# Patient Record
Sex: Male | Born: 1965
Health system: Southern US, Community
[De-identification: ages and names within clinical notes are randomized; demographics above are authoritative.]

## PROBLEM LIST (undated history)

## (undated) DIAGNOSIS — K219 Gastro-esophageal reflux disease without esophagitis: Secondary | ICD-10-CM

## (undated) DIAGNOSIS — R7881 Bacteremia: Secondary | ICD-10-CM

## (undated) DIAGNOSIS — M48062 Spinal stenosis, lumbar region with neurogenic claudication: Secondary | ICD-10-CM

## (undated) DIAGNOSIS — R079 Chest pain, unspecified: Secondary | ICD-10-CM

## (undated) DIAGNOSIS — M5442 Lumbago with sciatica, left side: Secondary | ICD-10-CM

## (undated) DIAGNOSIS — M5410 Radiculopathy, site unspecified: Secondary | ICD-10-CM

## (undated) DIAGNOSIS — K859 Acute pancreatitis without necrosis or infection, unspecified: Secondary | ICD-10-CM

## (undated) DIAGNOSIS — G894 Chronic pain syndrome: Secondary | ICD-10-CM

## (undated) DIAGNOSIS — N529 Male erectile dysfunction, unspecified: Secondary | ICD-10-CM

## (undated) DIAGNOSIS — G8929 Other chronic pain: Secondary | ICD-10-CM

## (undated) DIAGNOSIS — G4700 Insomnia, unspecified: Secondary | ICD-10-CM

## (undated) DIAGNOSIS — E785 Hyperlipidemia, unspecified: Secondary | ICD-10-CM

## (undated) DIAGNOSIS — Z9289 Personal history of other medical treatment: Secondary | ICD-10-CM

## (undated) DIAGNOSIS — I10 Essential (primary) hypertension: Secondary | ICD-10-CM

## (undated) DIAGNOSIS — K810 Acute cholecystitis: Secondary | ICD-10-CM

## (undated) DIAGNOSIS — K760 Fatty (change of) liver, not elsewhere classified: Secondary | ICD-10-CM

## (undated) DIAGNOSIS — M21372 Foot drop, left foot: Secondary | ICD-10-CM

## (undated) DIAGNOSIS — U071 COVID-19: Secondary | ICD-10-CM

## (undated) DIAGNOSIS — R011 Cardiac murmur, unspecified: Secondary | ICD-10-CM

## (undated) DIAGNOSIS — F419 Anxiety disorder, unspecified: Secondary | ICD-10-CM

## (undated) DIAGNOSIS — B9620 Unspecified Escherichia coli [E. coli] as the cause of diseases classified elsewhere: Secondary | ICD-10-CM

## (undated) DIAGNOSIS — N400 Enlarged prostate without lower urinary tract symptoms: Secondary | ICD-10-CM

## (undated) HISTORY — DX: Hyperlipidemia, unspecified: E78.5

## (undated) HISTORY — DX: Chest pain, unspecified: R07.9

## (undated) HISTORY — DX: Gastro-esophageal reflux disease without esophagitis: K21.9

## (undated) HISTORY — PX: TESTICLE TORSION REDUCTION: SHX795

## (undated) HISTORY — DX: Personal history of other medical treatment: Z92.89

## (undated) HISTORY — PX: LACERATION REPAIR: SHX5168

## (undated) HISTORY — DX: Anxiety disorder, unspecified: F41.9

---

## 2000-10-29 ENCOUNTER — Emergency Department (HOSPITAL_COMMUNITY): Admission: EM | Admit: 2000-10-29 | Discharge: 2000-10-29 | Payer: Self-pay | Admitting: Emergency Medicine

## 2000-10-29 ENCOUNTER — Encounter: Payer: Self-pay | Admitting: Emergency Medicine

## 2002-08-04 ENCOUNTER — Emergency Department (HOSPITAL_COMMUNITY): Admission: EM | Admit: 2002-08-04 | Discharge: 2002-08-05 | Payer: Self-pay | Admitting: *Deleted

## 2002-08-04 ENCOUNTER — Encounter: Payer: Self-pay | Admitting: *Deleted

## 2002-08-05 ENCOUNTER — Encounter: Payer: Self-pay | Admitting: *Deleted

## 2004-04-10 DIAGNOSIS — R079 Chest pain, unspecified: Secondary | ICD-10-CM

## 2004-04-10 DIAGNOSIS — Z9289 Personal history of other medical treatment: Secondary | ICD-10-CM

## 2004-04-10 HISTORY — DX: Chest pain, unspecified: R07.9

## 2004-04-10 HISTORY — DX: Personal history of other medical treatment: Z92.89

## 2005-03-04 ENCOUNTER — Emergency Department: Payer: Self-pay | Admitting: Emergency Medicine

## 2005-03-09 ENCOUNTER — Emergency Department (HOSPITAL_COMMUNITY): Admission: EM | Admit: 2005-03-09 | Discharge: 2005-03-10 | Payer: Self-pay | Admitting: Emergency Medicine

## 2005-03-11 ENCOUNTER — Emergency Department (HOSPITAL_COMMUNITY): Admission: EM | Admit: 2005-03-11 | Discharge: 2005-03-12 | Payer: Self-pay | Admitting: Emergency Medicine

## 2005-04-05 ENCOUNTER — Inpatient Hospital Stay (HOSPITAL_COMMUNITY): Admission: EM | Admit: 2005-04-05 | Discharge: 2005-04-07 | Payer: Self-pay | Admitting: Emergency Medicine

## 2005-04-07 ENCOUNTER — Ambulatory Visit: Payer: Self-pay | Admitting: Cardiology

## 2005-04-07 ENCOUNTER — Encounter: Payer: Self-pay | Admitting: Cardiology

## 2005-04-11 ENCOUNTER — Ambulatory Visit: Payer: Self-pay | Admitting: Internal Medicine

## 2005-04-15 ENCOUNTER — Ambulatory Visit: Payer: Self-pay | Admitting: Family Medicine

## 2005-04-17 ENCOUNTER — Ambulatory Visit: Payer: Self-pay | Admitting: Internal Medicine

## 2005-04-25 ENCOUNTER — Ambulatory Visit: Payer: Self-pay | Admitting: Internal Medicine

## 2005-10-19 ENCOUNTER — Ambulatory Visit: Payer: Self-pay | Admitting: Family Medicine

## 2006-04-27 ENCOUNTER — Ambulatory Visit: Payer: Self-pay | Admitting: Family Medicine

## 2006-07-04 ENCOUNTER — Ambulatory Visit: Payer: Self-pay | Admitting: Family Medicine

## 2006-11-28 ENCOUNTER — Ambulatory Visit: Payer: Self-pay | Admitting: Family Medicine

## 2006-12-07 ENCOUNTER — Ambulatory Visit: Payer: Self-pay | Admitting: Family Medicine

## 2006-12-07 DIAGNOSIS — F411 Generalized anxiety disorder: Secondary | ICD-10-CM

## 2006-12-07 DIAGNOSIS — F419 Anxiety disorder, unspecified: Secondary | ICD-10-CM | POA: Insufficient documentation

## 2006-12-11 ENCOUNTER — Telehealth (INDEPENDENT_AMBULATORY_CARE_PROVIDER_SITE_OTHER): Payer: Self-pay | Admitting: *Deleted

## 2006-12-11 ENCOUNTER — Encounter (INDEPENDENT_AMBULATORY_CARE_PROVIDER_SITE_OTHER): Payer: Self-pay | Admitting: *Deleted

## 2006-12-11 LAB — CONVERTED CEMR LAB
Cholesterol: 250 mg/dL (ref 0–200)
Direct LDL: 178.2 mg/dL
Glucose, Bld: 81 mg/dL (ref 70–99)
HDL: 39.8 mg/dL (ref >39.0)
Total CHOL/HDL Ratio: 6.3
Triglycerides: 137 mg/dL (ref 0–149)
VLDL: 27 mg/dL (ref 0–40)

## 2007-03-22 ENCOUNTER — Ambulatory Visit: Payer: Self-pay | Admitting: Family Medicine

## 2007-03-22 DIAGNOSIS — M549 Dorsalgia, unspecified: Secondary | ICD-10-CM | POA: Insufficient documentation

## 2007-04-05 ENCOUNTER — Ambulatory Visit: Payer: Self-pay | Admitting: Family Medicine

## 2007-04-05 ENCOUNTER — Telehealth (INDEPENDENT_AMBULATORY_CARE_PROVIDER_SITE_OTHER): Payer: Self-pay | Admitting: *Deleted

## 2007-04-05 DIAGNOSIS — M5430 Sciatica, unspecified side: Secondary | ICD-10-CM | POA: Insufficient documentation

## 2007-04-12 ENCOUNTER — Encounter: Admission: RE | Admit: 2007-04-12 | Discharge: 2007-04-12 | Payer: Self-pay | Admitting: Family Medicine

## 2007-04-16 ENCOUNTER — Telehealth (INDEPENDENT_AMBULATORY_CARE_PROVIDER_SITE_OTHER): Payer: Self-pay | Admitting: Family Medicine

## 2007-04-17 ENCOUNTER — Encounter (INDEPENDENT_AMBULATORY_CARE_PROVIDER_SITE_OTHER): Payer: Self-pay | Admitting: Family Medicine

## 2007-04-17 ENCOUNTER — Telehealth (INDEPENDENT_AMBULATORY_CARE_PROVIDER_SITE_OTHER): Payer: Self-pay | Admitting: *Deleted

## 2007-04-22 ENCOUNTER — Telehealth (INDEPENDENT_AMBULATORY_CARE_PROVIDER_SITE_OTHER): Payer: Self-pay | Admitting: Family Medicine

## 2007-04-24 ENCOUNTER — Encounter (INDEPENDENT_AMBULATORY_CARE_PROVIDER_SITE_OTHER): Payer: Self-pay | Admitting: Family Medicine

## 2007-05-08 ENCOUNTER — Telehealth (INDEPENDENT_AMBULATORY_CARE_PROVIDER_SITE_OTHER): Payer: Self-pay | Admitting: *Deleted

## 2007-06-03 ENCOUNTER — Telehealth (INDEPENDENT_AMBULATORY_CARE_PROVIDER_SITE_OTHER): Payer: Self-pay | Admitting: *Deleted

## 2007-12-02 ENCOUNTER — Emergency Department (HOSPITAL_COMMUNITY): Admission: EM | Admit: 2007-12-02 | Discharge: 2007-12-02 | Payer: Self-pay | Admitting: Emergency Medicine

## 2009-04-07 ENCOUNTER — Encounter
Admission: RE | Admit: 2009-04-07 | Discharge: 2009-04-07 | Payer: Self-pay | Admitting: Physical Medicine and Rehabilitation

## 2009-10-23 ENCOUNTER — Emergency Department (HOSPITAL_COMMUNITY): Admission: EM | Admit: 2009-10-23 | Discharge: 2009-10-23 | Payer: Self-pay | Admitting: Emergency Medicine

## 2010-05-12 NOTE — Progress Notes (Signed)
Summary: PHYSICAL THERPY REFERRAL?  Phone Note From Other Clinic   Caller: Referral Coordinator Summary of Call: [facility] [place]- [phone]--THEY RECD A CALL FROM PT STATING DR Blossom Hoops RECOMMENDED P/T--PER NOTES IN CHART--RECOMMEND NEURO-SURGICAL--PLS REVIEW CALL [place] AT [facility] P/T...................................................................Marland KitchenDaine Gip  June 03, 2007 3:08 PM Initial call taken by: Daine Gip,  June 03, 2007 3:08 PM  Follow-up for Phone Call        Dr. Blossom Hoops,  I see in your notes that (append on xray/mri) that Dr. Lovell Sheehan is seeing patient and I think he is the one that is sending for PT or is it you and there is no documentation.  Thanks,  Marcelino Duster Follow-up by: Ardyth Man,  June 03, 2007 3:18 PM  Additional Follow-up for Phone Call Additional follow up Details #1::        Correct. It is not me. Please talk to [place]. Additional Follow-up by: Leanne Chang MD,  June 03, 2007 3:38 PM    Additional Follow-up for Phone Call Additional follow up Details #2::    sp[facility] with Kathryne Hitch at [facility] and she is aware to call dr Lovell Sheehan at [facility] neurosurgery if she has any questions or needs information for insurance authorization.  pt was also aware that this should be going through dr Lovell Sheehan office.  he was not satisfied with his care at the physical therapy office that he was sent to initially. Follow-up by: Gwen Pounds,  June 04, 2007 2:31 PM

## 2010-05-12 NOTE — Progress Notes (Signed)
Summary: [place] Pert 500MG /FLEXERIL 10MG   Phone Note Refill Request Call back at Home Phone [phone] Message from:  Patient  Refills Requested: Medication #1:  NAPROSYN 500 MG TABS 1 two times a day pc prn  Medication #2:  FLEXERIL 10 MG TABS 1 by mouth tid. [facility] [address] -- CALL FROM PT -- STILL WAITING ON REFERRAL TO NEUROSURGEON AND STILL IN PAIN.  LET HIM KNOW THAT I WOULD LET HIM KNOW SOMETHING ABOUT HIS REFERRAL BY THE END OF THE DAY  Initial call taken by: Gwen Pounds,  April 22, 2007 9:45 AM  Follow-up for Phone Call        okay for 1 refill each. Recommend take OTC Prilosec while taking Naprosyn. Follow instruction on bottle of Prilosec Follow-up by: Leanne Chang MD,  April 22, 2007 10:12 AM  Additional Follow-up for Phone Call Additional follow up Details #1::        Spoke with patient and is aware. ...................................................................Ardyth Man  April 22, 2007 10:23 AM  Additional Follow-up by: Ardyth Man,  April 22, 2007 10:23 AM      Prescriptions: FLEXERIL 10 MG TABS (CYCLOBENZAPRINE HCL) 1 by mouth tid  #30 x 0   Entered by:   Ardyth Man   Authorized by:   Leanne Chang MD   Signed by:   Ardyth Man on 04/22/2007   Method used:   Electronically sent to ...       [facility] [address]       [address]       [place], [place]  [address]       Ph: [phone]       Fax: [phone]   RxID:   [id number] NAPROSYN 500 MG TABS (NAPROXEN) 1 two times a day pc prn  #30 x 0   Entered by:   Ardyth Man   Authorized by:   Leanne Chang MD   Signed by:   Ardyth Man on 04/22/2007   Method used:   Electronically sent to ...       [facility] [address]       [address]       [place], [place]  [address]       Ph: [phone]       Fax: [phone]   RxID:   [id number]

## 2010-05-12 NOTE — Progress Notes (Signed)
Summary: results  Phone Note Call from Patient Call back at Home Phone [phone]   Caller: Patient Summary of Call: dr. Blossom Hoops pt would like the results of the mri results. pt is aware of dr. Blossom Hoops being out of the office this afternoon. Initial call taken by: Charolette Child,  April 16, 2007 3:43 PM  Follow-up for Phone Call        Advise herniated disc. Will refer to NeuroSurgery. Follow-up by: Leanne Chang MD,  April 17, 2007 8:05 AM

## 2010-05-12 NOTE — Progress Notes (Signed)
  Phone Note Outgoing Call Call back at [facility] Phone [phone]   Call placed by: Ardyth Man,  April 05, 2007 3:07 PM Call placed to: Patient Summary of Call: Left message for patient to return call regarding xray ...................................................................Ardyth Man  April 05, 2007 3:08 PM   Follow-up for Phone Call        Patient aware ...................................................................Ardyth Man  April 05, 2007 4:30 PM  Follow-up by: Ardyth Man,  April 05, 2007 4:30 PM

## 2010-05-12 NOTE — Letter (Signed)
Summary: Results Follow-up Letter  [place] [facility]  [address] [place], [place] [address]   Phone: [phone]  Fax: [phone]    12/11/2006         Omar Phillips [address] [place], [place]  [address]  Dear Mr. Sciulli,   The following are the results of your recent test(s):  Test     Result     Pap Smear    Normal_______  Not Normal_____       Comments: _________________________________________________________ Cholesterol LDL(Bad cholesterol):          Your goal is less than:         HDL (Good cholesterol):        Your goal is more than: _________________________________________________________ Other Tests:   _________________________________________________________  Please call for an appointment Or Please see attached_________________________________________________________ _________________________________________________________ _________________________________________________________  Sincerely,  Ardyth Man Bladen at [facility]

## 2010-05-12 NOTE — Assessment & Plan Note (Signed)
Summary: back pain still--acute only//tl   Vital Signs:  Patient Profile:   45 Years Old Male Height:     73 inches Weight:      208 pounds Temp:     98.1 degrees F oral Pulse rate:   72 / minute Resp:     14 per minute BP sitting:   130 / 90  (right arm)  Vitals Entered By: Ardyth Man (April 05, 2007 8:02 AM)                 Chief Complaint:  pain on right side hip area.  History of Present Illness: Patient reports feels better when he takes the medications as directed, but still have right lower buttocks in lateral thigh and sometimes toes. Describes it a tingling sensation. Not constant. Standing for a siting position or bending precipitate it. No other symptoms. Patient revealed he usually has his wallet in his right back pocket.  Current Allergies: No known allergies       Physical Exam  General:     Well-developed,well-nourished,in no acute distress; alert,appropriate and cooperative throughout examination Neurologic:     alert & oriented X3, gait normal, and DTRs symmetrical and normal.     Detailed Back/Spine Exam  Lumbosacral Exam:  Inspection-deformity:    Normal Sitting Straight Leg Raise:    Right:  negative Sciatic Notch:    There is right sciatic notch tenderness. Toe Walking:    Right:  normal Heel Walking:    Right:  normal    Impression & Recommendations:  Problem # 1:  BACK PAIN (ICD-724.5) with probable sciatica Continue medication below Added Prevacid QAM for 8 days for GI protection Precaution of medication reviewed Will obtain x-ray of righ hip and L-spine. Will schedule referral for MRI Patient to f/u if symptoms worsen or don't improved. Would avoid prednisone since patient had a significant anxiety reaction to "something" several years ago that took up to a year to get him back to baseline. Since prednisone can effect mood in some people, would prefer not to use for now. Advise to take Naprosyn for 7 days only His  updated medication list for this problem includes:    Naprosyn 500 Mg Tabs (Naproxen) .Marland Kitchen... 1 two times a day pc prn    Flexeril 10 Mg Tabs (Cyclobenzaprine hcl) .Marland Kitchen... 1 by mouth tid   Complete Medication List: 1)  Naprosyn 500 Mg Tabs (Naproxen) .Marland Kitchen.. 1 two times a day pc prn 2)  Flexeril 10 Mg Tabs (Cyclobenzaprine hcl) .Marland Kitchen.. 1 by mouth tid  Other Orders: T-Hip Comp Right Min 2 views (73510TC) T-Lumbar Spine Complete, 5 Views [phone]) Radiology Referral (Radiology)     Prescriptions: FLEXERIL 10 MG TABS (CYCLOBENZAPRINE HCL) 1 by mouth tid  #30 x 0   Entered and Authorized by:   Leanne Chang MD   Signed by:   Leanne Chang MD on 04/05/2007   Method used:   Print then Give to Patient   RxID:   [id number] NAPROSYN 500 MG TABS (NAPROXEN) 1 two times a day pc prn  #30 x 0   Entered and Authorized by:   Leanne Chang MD   Signed by:   Leanne Chang MD on 04/05/2007   Method used:   Print then Give to Patient   RxID:   [id number]  ]

## 2010-05-12 NOTE — Progress Notes (Signed)
  Phone Note Outgoing Call   Call placed by: Ardyth Man,  December 11, 2006 3:52 PM Call placed to: Patient Summary of Call: [facility] and mailed information ...................................................................Ardyth Man  December 11, 2006 3:52 PM

## 2010-05-12 NOTE — Progress Notes (Signed)
Summary: ALEJANDRO-REFILL NAPROSYN 500MG /FLEXERIL 10MG   Phone Note Refill Request Call back at Home Phone [phone] Message from:  Patient  Refills Requested: Medication #1:  NAPROSYN 500 MG TABS 1 two times a day pc prn   Dosage confirmed as above?Dosage Confirmed  Medication #2:  FLEXERIL 10 MG TABS 1 by mouth tid. PHARMACY [facility] [address]  PT HAS SEEN [facility] AND HE SAYS SURGERY NOT REQUIRED AT THIS TIME AND TO CONTINUE MEDS PRESCRIBED BY DR Blossom Hoops.   HE IS IN PHYSICAL THERAPY THROUGH DR [facility] OFFICE ALSO  Initial call taken by: Gwen Pounds,  May 08, 2007 12:46 PM  Follow-up for Phone Call        Is this okay? Thanks, Marcelino Duster Follow-up by: Ardyth Man,  May 08, 2007 12:51 PM  Additional Follow-up for Phone Call Additional follow up Details #1::        Since Dr. Lovell Sheehan is sending him to Physical therapy, he is taking over the care, so I would recommend he get Rx for them so they are aware of medications being given. Additional Follow-up by: Leanne Chang MD,  May 08, 2007 3:25 PM    Additional Follow-up for Phone Call Additional follow up Details #2::    Spoke with patient and is aware. ...................................................................Ardyth Man  May 08, 2007 4:03 PM  Follow-up by: Ardyth Man,  May 08, 2007 4:03 PM

## 2010-05-12 NOTE — Progress Notes (Signed)
  Phone Note Outgoing Call Call back at [facility] Phone [phone]   Call placed by: Ardyth Man,  April 17, 2007 8:23 AM Call placed to: Patient Summary of Call: Pt. aware of results and referral to a neurosurgeon, referral sent to [facility] ...................................................................Ardyth Man  April 17, 2007 8:23 AM

## 2010-05-12 NOTE — Miscellaneous (Signed)
Summary: Orders Update  Clinical Lists Changes  Orders: Added new Referral order of Neurosurgeon Referral (Neurosurgeon) - Signed 

## 2010-05-12 NOTE — Assessment & Plan Note (Signed)
Summary: CPX/ FASTING//CYD   Vital Signs:  Patient Profile:   45 Years Old Male Height:     73 inches Weight:      203 pounds Temp:     97.5 degrees F oral Pulse rate:   68 / minute Resp:     14 per minute BP sitting:   120 / 80  (right arm)  Pt. in pain?   no  Vitals Entered By: Ardyth Man (December 07, 2006 10:09 AM)               Vision Screening: Left eye with correction: 20 / 20 Right eye with correction: 20 / 20 Both eyes with correction: 20 / 20        Vision Entered By: Ardyth Man (December 07, 2006 10:10 AM)     Chief Complaint:  CPX and Labs.     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     Physical Exam  General:     Well-developed,well-nourished,in no acute distress; alert,appropriate and cooperative throughout examination Head:     Normocephalic and atraumatic without obvious abnormalities. No apparent alopecia or balding. Eyes:     No corneal or conjunctival inflammation noted. EOMI. Perrla. Funduscopic exam benign, without hemorrhages, exudates or papilledema. Vision grossly normal. Ears:     External ear exam shows no significant lesions or deformities.  Otoscopic examination reveals clear canals, tympanic membranes are intact bilaterally without bulging, retraction, inflammation or discharge. Hearing is grossly normal bilaterally. Nose:     External nasal examination shows no deformity or inflammation. Nasal mucosa are pink and moist without lesions or exudates. Mouth:     Oral mucosa and oropharynx without lesions or exudates.   Teeth in good repair. Neck:     No deformities, masses, or tenderness noted. Chest Wall:     No deformities, masses, tenderness or gynecomastia noted. Lungs:     Normal respiratory effort, chest expands symmetrically. Lungs are clear to auscultation, no crackles or wheezes. Heart:     Normal rate and regular rhythm. S1 and S2 normal without gallop, murmur, click, rub or other extra sounds. Abdomen:     Bowel sounds positive,abdomen soft and non-tender without masses, organomegaly or hernias noted. Genitalia:     Testes bilaterally descended without nodularity, tenderness or masses. No scrotal masses or lesions. No penis lesions or urethral discharge. Msk:     No deformity or scoliosis noted of thoracic or lumbar spine.   Pulses:     R and L carotid,radial,femoral,dorsalis pedis and posterior tibial pulses are full and equal bilaterally Extremities:     No clubbing, cyanosis, edema, or deformity noted with normal full range of motion of all joints.   Neurologic:     No cranial nerve deficits noted. Station and gait are normal. Plantar reflexes are down-going bilaterally. DTRs are symmetrical throughout. Sensory, motor and coordinative functions appear intact. Skin:     Intact without suspicious lesions or rashes Cervical Nodes:     No lymphadenopathy noted  Axillary Nodes:     No palpable lymphadenopathy Inguinal Nodes:     No significant adenopathy Psych:     Cognition and judgment appear intact. Alert and cooperative with normal attention span and concentration. No apparent delusions, illusions, hallucinations    Impression & Recommendations:  Problem # 1:  Preventive Health Care (ICD-V70.0) 1. Health promotion and screening reviewed 2. Patient info provided   Reviewed preventive care protocols, scheduled due services, and updated immunizations.   Other Orders: TLB-Lipid Panel (80061-LIPID) TLB-Glucose, QUANT (82947-GLU) Tdap => 60yrs IM (04540) Admin 1st Vaccine  (98119)       Tetanus/Td Vaccine    Vaccine Type: Tdap    Site: right deltoid    Mfr: sanofi pasteur    Dose: 0.5 ml    Route: IM    Given by: Ardyth Man    Exp. Date: 12/26/2008    Lot #: [id number]    VIS given: 10/19/04 version given December 07, 2006.

## 2010-05-12 NOTE — Letter (Signed)
Summary: [facility]   Imported By: Freddy Jaksch 06/07/2007 08:46:41  _____________________________________________________________________  External Attachment:    Type:   Image     Comment:   External Document

## 2010-05-12 NOTE — Assessment & Plan Note (Signed)
Summary: acute only - hip pain,cbs   Vital Signs:  Patient Profile:   45 Years Old Male Height:     73 inches Weight:      205 pounds Temp:     98.2 degrees F oral Pulse rate:   78 / minute Resp:     14 per minute BP sitting:   140 / 90  (right arm)  Pt. in pain?   yes    Location:   hip    Intensity:   10    Type:       burning  Vitals Entered By: Ardyth Man (March 22, 2007 1:32 PM)                  Chief Complaint:  hip and leg pain x 3 days and Back pain.  History of Present Illness:  Back Pain      This is a 45 year old man who presents with Back pain.  Patient played basketball 3 days ago and next morning felt discomfort and stiffness in right lower back. Had radiation of pain into right leg yesterday which has not occurred. Usually not bad while active, but if he tries to sit or get up after sitting for a while, its painful.  The patient denies fever, chills, weakness, loss of sensation, fecal incontinence, urinary incontinence, urinary retention, and dysuria.  The pain is located in the right low back.  The pain radiates to the right hip and right buttock.  The pain is made worse by lying down and flexion.  The pain is made better by inactivity.    Current Allergies: No known allergies       Physical Exam  General:     Well-developed,well-nourished,in no acute distress; alert,appropriate and cooperative throughout examination   Detailed Back/Spine Exam  General:    Well-developed, well-nourished, normal body habitus; no deformities, normal grooming.    Gait:    Normal heel-toe gait pattern bilaterally.    Lumbosacral Exam:  Inspection-deformity:    Abnormal    Tenderness and notable spasm of right lower lumbar-sacral paraspinal muscles. No midline tenderness. Lying Straight Leg Raise:    Right:  positive in back only    Left:  negative Sitting Straight Leg Raise:    Right:  positive in back only    Left:  negative Contralateral Straight  Leg Raise:    Right:  negative Toe Walking:    Right:  normal Heel Walking:    Right:  normal    Impression & Recommendations:  Problem # 1:  BACK PAIN (ICD-724.5)  His updated medication list for this problem includes:    Naprosyn 500 Mg Tabs (Naproxen) .Marland Kitchen... 1 two times a day pc as needed: potential side effect reviewed.    Flexeril 10 Mg Tabs (Cyclobenzaprine hcl) .Marland Kitchen... 1 by mouth three times a day: sedative precaution reviewed. Discussed use of moist heat or ice, modified activities, medications, and stretching/strengthening exercises. Back care instructions given. To be seen in 2 weeks if no improvement; sooner if worsening of symptoms.   Complete Medication List: 1)  Naprosyn 500 Mg Tabs (Naproxen) .Marland Kitchen.. 1 two times a day pc prn 2)  Flexeril 10 Mg Tabs (Cyclobenzaprine hcl) .Marland Kitchen.. 1 by mouth tid     Prescriptions: FLEXERIL 10 MG TABS (CYCLOBENZAPRINE HCL) 1 by mouth tid  #30 x 0   Entered and Authorized by:   Leanne Chang MD   Signed by:   Leanne Chang MD on 03/22/2007  Method used:   Print then Give to Patient   RxID:   [id number] NAPROSYN 500 MG TABS (NAPROXEN) 1 two times a day pc prn  #30 x 0   Entered and Authorized by:   Leanne Chang MD   Signed by:   Leanne Chang MD on 03/22/2007   Method used:   Print then Give to Patient   RxID:   [id number]  ]

## 2010-08-26 NOTE — Cardiovascular Report (Signed)
NAMEKRISHAWN, Omar Phillips              ACCOUNT NO.:  [id number]   MEDICAL RECORD NO.:  [id number]          PATIENT TYPE:  INP   LOCATION:  4715                         FACILITY:  [facility]   PHYSICIAN:  Rollene Rotunda, M.D.   DATE OF BIRTH:  1965/07/01   DATE OF PROCEDURE:  04/06/2005  DATE OF DISCHARGE:                              CARDIAC CATHETERIZATION   PROCEDURE:  Left heart catheterization/coronary arteriography.   REASON FOR PRESENTATION:  Evaluation for chest pain and abnormal EKG.   PROCEDURE NOTE:  Left heart catheterization performed via the right femoral  artery, the artery was cannulated using anterior wall puncture.  A #6 French  arterial sheath was inserted via modified Seldinger technique.  Preformed  Judkins and a pigtail catheter were utilized.  The patient tolerated the  procedure well and left the lab in stable condition.   RESULTS:  Hemodynamics:  LV 116/13, AO 116/94.   Coronaries:  The left main was normal.  The LAD was large wrapping the apex,  it was normal throughout its course.  The first and second diagonal were  small to moderate size and normal.  The circumflex in the AV groove had  scattered luminal irregularities.  The first obtuse marginal was small and  normal.  The second obtuse marginal was small and normal.  The third obtuse  marginal was very large and normal.  The right coronary artery was a very  large dominant vessel.  There were diffuse mild luminal irregularities.  The  PDA was moderate size and normal.   Left ventriculogram:  The left ventriculogram was obtained in the RAO  projection.  The EF was 60% with normal wall motion.   CONCLUSION:  Mild coronary plaque.  Well preserved ejection fraction.   PLAN:  Will check an echocardiogram to make sure he does not have  significant LVH to explain his abnormal EKG.  In addition, will get a D-  dimer, though the probability of pulmonary embolism is very low.     ______________________________  Rollene Rotunda, M.D.     JH/MEDQ  D:  04/06/2005  T:  04/06/2005  Job:  [id number]   cc:   Thomos Lemons, D.O. [facility]  [address] [place], [place] [address]

## 2010-08-26 NOTE — H&P (Signed)
NAMEMECCA, Phillips NO.:  [id number]   MEDICAL RECORD NO.:  [id number]          PATIENT TYPE:  EMS   LOCATION:  MAJO                         FACILITY:  [facility]   PHYSICIAN:  Duke Salvia, M.D.  DATE OF BIRTH:  09-06-1965   DATE OF ADMISSION:  04/05/2005  DATE OF DISCHARGE:                                HISTORY & PHYSICAL   PRIMARY CARE:  Formerly was Theatre stage manager.  The patient is now being  followed by Dr. Artist Pais with [facility].   HISTORY OF PRESENT ILLNESS:  [name] is a 45 year old African-American  gentleman who presented to urgent care today with complaints of chest pain.  [name] states his onset actually began around March 31, 2005 or  April 01, 2005.  He states he has been increased stress.  He has not been  sleeping well secondary to recent diagnosis with an upper respiratory  infection.  He contributes all of his symptoms initially to the respiratory  infection, and the NyQuil and Mucinex he was taking for congestion.  Chest  pain has been intermittent since that time.  He describes it as a midsternal  discomfort.  He initially took some Mylanta.  He states he obtained  temporary relief, but his chest pain would quickly return.  He has also  complained of increased belching, nausea, and generalized weakness; however,  the chest pain has progressively gotten worse with activity.  [place] [employer] [place] he chest pain worsened, but was relieved with rest.  He  went to Urgent Care for further evaluation.  An EKG was done there with  significant ST abnormalities.  The patient was transferred to [facility] via  EMS for evaluation.  [name] is currently pain free.  He also complains  of new onset of dyspnea on exertion and generalized weakness.   ALLERGIES:  None.   MEDICATIONS:  Recently started on lorazepam and Lunesta for sleep.   PAST MEDICAL HISTORY:  1.  The patient states he was evaluated possibly by a  cardiologist at      [facility] years ago for a heart murmur.  He is unsure of any work-      up at that time.  2.  The only other history is a questionable testicular torsion in college      playing football.  Denies any history of hypertension, diabetes, thyroid      problems, or lung disorders.   SOCIAL HISTORY:  He lives in [place] his wife.  He works with [employer].  He has 3 children.  Denies any ETOH, tobacco, drug, or  __________ medication use.  He follows a regular diet.  Exercise - he is  very active.  He runs and plays ball with his children.   FAMILY HISTORY:  Mother alive with hypertension, thyroid, possible diabetes.  Recently had an episode of chest pain and had a cardiac catheterization,  which the patient states was normal.  Father unknown.  The patient is  estranged from his father.  Siblings - he has a brother  with hypertension.   REVIEW OF SYSTEMS:  Positive for headache, chest pain, dyspnea on exertion,  occasional palpitations, occasional wheezing.  NEUROPSYCHIATRIC:  Positive  for generalized weakness, some anxiety and insomnia related to adverse  reactions with Benadryl.  GI:  Positive for nausea and GERD symptoms.  All  other systems negative per patient.   PHYSICAL EXAMINATION:  VITAL SIGNS:  Temperature 97.8, pulse 65,  respirations 20, blood pressure of 136/85.  The patient is satting 96% on  room air, 100% on 2 liters.  GENERAL:  He is in no acute distress, resting very quietly and comfortably  on the stretcher.  HEENT:  Pupils equal, round and reactive to light.  Sclerae are clear.  Dentition teeth.  NECK:  Supple without lymphadenopathy.  Negative for bruit or JVD.  CARDIOVASCULAR:  Heart rate regular rhythm, S1 and S2.  Pulses are 2+ and  equal without bruits.  LUNGS:  Clear to auscultation bilaterally.  ABDOMEN:  Soft, nontender.  Positive bowel sounds x4.  EXTREMITIES:  Without clubbing, cyanosis, or edema.  He has  2+ DPs  bilateral.  No spine or CVA tenderness.  NEUROLOGIC:  Alert and oriented x3.  Cranial nerves II-XII grossly intact.   Chest x-ray is pending.  EKG has a rate of 60, sinus rhythm, with diffuse ST-  T wave changes.  The patient has significant T wave inversions in I, II,  III, aVF, V4 through V6.   LABORATORY DATA:  Lab work is pending at this time.   Dr. Sherryl Manges in to examine and assess the patient with chest pain in the  setting of an abnormal EKG and borderline hypertension.  The patient will be  admitted.  Will cycle enzymes.  We will check a urine drug screen.  The  patient will be started on heparin and aspirin.  Will check a lipid panel.  Plan for cardiac catheterization in a.m.      Dorian Pod, NP    ______________________________  Duke Salvia, M.D.    MB/MEDQ  D:  04/05/2005  T:  04/05/2005  Job:  [id number]

## 2010-08-26 NOTE — Discharge Summary (Signed)
Omar Phillips, Omar Phillips              ACCOUNT NO.:  [id number]   MEDICAL RECORD NO.:  [id number]          PATIENT TYPE:  INP   LOCATION:  4715                         FACILITY:  [facility]   PHYSICIAN:  Rollene Rotunda, M.D.   DATE OF BIRTH:  1965/06/30   DATE OF ADMISSION:  04/05/2005  DATE OF DISCHARGE:  04/07/2005                                 DISCHARGE SUMMARY   PRIMARY CARE PHYSICIAN:  Formerly was Theatre stage manager. The patient is now  being requested to be followed by Dr. Artist Pais with [facility].   DISCHARGE DIAGNOSES:  1.  Chest pain with both typical and atypical features, status post cardiac      catheterization on April 06, 2005, by Dr. Antoine Poche. Patient with a      normal LVEF of 65% with mild coronary plaque, well preserved EF.      1.  Abnormal EKG with diffuse ST-T changes, significant T-wave          inversions in I, II, III, aVF, and V4 through V6.      2.  Cardiac markers slightly elevated with a troponin of 0.06 times          three sets.      3.  Echocardiogram with an EF of 55% to 65% with left ventricular wall          thickness in the upper limits of normal with mild mitral          regurgitation.  2.  Hypertension. New onset, being treated with calcium channel blocker.  3.  Depression.  4.  Insomnia.  5.  Anxiety.   PAST MEDICAL HISTORY:  1.  The patient states he was evaluated at [facility] years ago      possibly by cardiologist for a heart murmur. The patient is unsure of      what type of workup he had at that time.  2.  Possible testicular torsion. The patient states he had some kind of      testicular injury in school playing football. No other known medical      history.  3.  Anxiety/depression/insomnia. Recently started on lorazepam and Lunesta.   ALLERGIES:  None.   HOSPITAL COURSE:  Mr. Heidelberg is a  very pleasant 45 year old African  American gentleman with no known coronary artery disease history who  presented initially to  urgent care with complaints of chest pain that was  more pronounced after playing basketball. Mr. Somerville also stated he had  recently been medicated for an upper respiratory tract infection and he  feels like his symptoms all began during this time. On initial exam in  urgent care an EKG was done that showed ST-T wave changes and the patient  was transported to [facility] ER for further evaluation. The patient was  found to have significant T-wave inversions as stated above. He denied any  chest pain on arrival to [facility], somewhat anxious. Initial  point of care markers revealed troponin of 0.05. The patient was admitted,  cardiac enzymes were cycled, troponin 0.06 times three. With the patient's  abnormal EKG and slightly elevated cardiac enzymes it was felt he needed  further evaluation. Cardiac catheterization was planned. The patient was  taken to the cath lab on April 06, 2005, with results as stated above.  The patient tolerated the procedure without complications. Mild coronary  plaque, well preserved EF. It was arranged for the patient to have an  echocardiogram to evaluate for LVH with a new diagnosis of hypertension.  Echo done on April 07, 2005, with results stated above. The patient is  being discharged home with low dose Norvasc 2.5 mg daily. Cath site to right  groin stable without bruit or hematoma. Afebrile with blood pressure at  discharge 107/68.  Chest x-ray, PA and lateral, showing decreased basilar  atelectasis with no acute findings. Patient educated about behavior  modification, risk reduction related to hypertension, stress, insomnia, and  also enforced the need to follow up with Dr. Artist Pais for primary care concerns  and further evaluation of depression. This is probably a major factor in Mr.  Schack's insomnia and anxiety.   Labwork prior to discharge included D-dimer less than 0.22, WBC 4.6,  hemoglobin 14.1, hematocrit 39.8 with platelet count  of 319,000. Potassium  3.9, BUN 13, creatinine 1.2. Drug screen negative excluding benzos which the  patient has a prescription of lorazepam. AST 25, AST 22, TSH 1.419. At  discharge the patient was given a prescription for Norvasc 2.5 mg p.o. daily  for blood pressure. He is to follow a low sodium diet. He may shower. No tub  bathing times two days. No driving for two days. No lifting over 10 pounds  times one week. He is to refrain from any caffeine. He is to follow up with  Dr. Artist Pais. He already has an appointment scheduled. The patient reports on  January 4th he is to call our office for any problems with the cath site.   DURATION OF DISCHARGE:  30 minutes.      Dorian Pod, NP    ______________________________  Rollene Rotunda, M.D.    MB/MEDQ  D:  04/07/2005  T:  04/08/2005  Job:  [id number]   cc:   Thomos Lemons, D.O. [facility]  [address] [place], [place] [address]

## 2011-04-11 ENCOUNTER — Encounter: Payer: Self-pay | Admitting: *Deleted

## 2011-04-11 ENCOUNTER — Emergency Department (INDEPENDENT_AMBULATORY_CARE_PROVIDER_SITE_OTHER)
Admission: EM | Admit: 2011-04-11 | Discharge: 2011-04-11 | Disposition: A | Discharge status: Home / Self Care | Payer: 59 | Source: Home / Self Care | Attending: Emergency Medicine | Admitting: Emergency Medicine

## 2011-04-11 DIAGNOSIS — B3789 Other sites of candidiasis: Secondary | ICD-10-CM

## 2011-04-11 DIAGNOSIS — R059 Cough, unspecified: Secondary | ICD-10-CM

## 2011-04-11 DIAGNOSIS — R05 Cough: Secondary | ICD-10-CM

## 2011-04-11 HISTORY — DX: Essential (primary) hypertension: I10

## 2011-04-11 MED ORDER — PREDNISONE 20 MG PO TABS: 40.0000 mg | ORAL_TABLET | Freq: Every day | ORAL | Status: AC

## 2011-04-11 MED ORDER — FLUCONAZOLE 150 MG PO TABS: 150.0000 mg | ORAL_TABLET | Freq: Once | ORAL | Status: AC

## 2011-04-11 NOTE — ED Notes (Signed)
Per pt cough since 12/18 has taken azithromycin started 12/28 x 3 days - congestion improved continues with cough - has prescription for hydromet syrup which he takes at night

## 2011-04-11 NOTE — ED Notes (Signed)
Explained process to pt for obtaining hiv test results -

## 2011-04-11 NOTE — ED Provider Notes (Signed)
History     CSN: [id number]  Arrival date & time 04/11/11  2952   First MD Initiated Contact with Patient 04/11/11 1813      Chief Complaint  Patient presents with  . Cough    (Consider location/radiation/quality/duration/timing/severity/associated sxs/prior treatment) HPI Comments: COUGH, x and with a sore throat x 2 weeks, is not getting better at all "hurts to cough" been doing all sorts of remedies and gargles nothing is working " "My doctor has prescribed me two medicines one is a cough syrup and the other one an antibiotic" (z-pack) Im still coughing, No fevers , No SOB  Patient is a 46 y.o. male presenting with cough. The history is provided by the patient.  Cough This is a recurrent problem. The current episode started more than 1 week ago. The problem has not changed since onset.The cough is non-productive. Associated symptoms include sore throat. Pertinent negatives include no chills, no sweats, no shortness of breath and no wheezing. The treatment provided no relief. He is not a smoker.    Past Medical History  Diagnosis Date  . Hypertension     History reviewed. No pertinent past surgical history.  History reviewed. No pertinent family history.  History  Substance Use Topics  . Smoking status: Never Smoker   . Smokeless tobacco: Not on file  . Alcohol Use: No      Review of Systems  Constitutional: Negative for chills.  HENT: Positive for sore throat. Negative for sinus pressure.   Eyes: Negative for photophobia.  Respiratory: Positive for cough. Negative for chest tightness, shortness of breath and wheezing.     Allergies  Review of patient's allergies indicates no known allergies.  Home Medications   Current Outpatient Rx  Name Route Sig Dispense Refill  . HYDROCODONE-HOMATROPINE 5-1.5 MG/5ML PO SYRP Oral Take by mouth every 6 (six) hours as needed.      Marland Kitchen FLUCONAZOLE 150 MG PO TABS Oral Take 1 tablet (150 mg total) by mouth once. 7 tablet 0  .  PREDNISONE 20 MG PO TABS Oral Take 2 tablets (40 mg total) by mouth daily. 10 tablet 0    BP 143/98  Pulse 72  Temp(Src) 98 F (36.7 C) (Oral)  Resp 18  SpO2 98%  Physical Exam  Nursing note and vitals reviewed. Constitutional: Vital signs are normal. He appears well-developed and well-nourished.  Non-toxic appearance. He does not have a sickly appearance. He does not appear ill. No distress.  HENT:  Head: Normocephalic.  Mouth/Throat: Uvula is midline and mucous membranes are normal. Posterior oropharyngeal erythema present.    Eyes: Conjunctivae are normal.  Neck: Normal range of motion. No JVD present. No rigidity. No erythema and normal range of motion present.  Cardiovascular: Normal rate.   Pulmonary/Chest: Effort normal and breath sounds normal. No respiratory distress. He has no decreased breath sounds. He has no wheezes. He has no rhonchi. He has no rales.  Musculoskeletal: He exhibits no tenderness.  Lymphadenopathy:    He has no cervical adenopathy.       Right cervical: No superficial cervical adenopathy present.      Left cervical: No superficial cervical adenopathy present.  Skin: Skin is warm.    ED Course  Procedures (including critical care time)   Labs Reviewed  STREP A DNA PROBE  HIV ANTIBODY (ROUTINE TESTING)   No results found.   1. Pharyngeal candidiasis   2. Cough       MDM  COUGH-recurrent- with ongoing pharyngitis- clinically  resembles, candidiasis. Finished antibiotic cycle by PCP. Culture obtained        Jimmie Molly, MD 04/11/11 2105

## 2011-04-12 LAB — STREP A DNA PROBE: Group A Strep Probe: NEGATIVE

## 2011-04-12 LAB — HIV ANTIBODY (ROUTINE TESTING W REFLEX): HIV: NONREACTIVE

## 2011-04-14 ENCOUNTER — Telehealth (HOSPITAL_COMMUNITY): Payer: Self-pay | Admitting: *Deleted

## 2011-05-02 ENCOUNTER — Ambulatory Visit (INDEPENDENT_AMBULATORY_CARE_PROVIDER_SITE_OTHER): Payer: 59

## 2011-05-02 DIAGNOSIS — R079 Chest pain, unspecified: Secondary | ICD-10-CM

## 2011-05-02 DIAGNOSIS — I10 Essential (primary) hypertension: Secondary | ICD-10-CM

## 2011-05-29 ENCOUNTER — Other Ambulatory Visit (HOSPITAL_COMMUNITY): Payer: Self-pay | Admitting: Cardiology

## 2011-05-29 DIAGNOSIS — I422 Other hypertrophic cardiomyopathy: Secondary | ICD-10-CM

## 2011-05-30 ENCOUNTER — Encounter: Payer: Self-pay | Admitting: Cardiology

## 2011-06-02 ENCOUNTER — Inpatient Hospital Stay (HOSPITAL_COMMUNITY)
Admission: RE | Admit: 2011-06-02 | Discharge: 2011-06-02 | Payer: 59 | Source: Ambulatory Visit | Attending: Cardiology | Admitting: Cardiology

## 2011-06-12 ENCOUNTER — Ambulatory Visit (HOSPITAL_COMMUNITY): Admission: RE | Admit: 2011-06-12 | Payer: 59 | Source: Ambulatory Visit

## 2011-06-12 ENCOUNTER — Ambulatory Visit (INDEPENDENT_AMBULATORY_CARE_PROVIDER_SITE_OTHER): Payer: 59 | Admitting: Family Medicine

## 2011-06-12 DIAGNOSIS — K219 Gastro-esophageal reflux disease without esophagitis: Secondary | ICD-10-CM

## 2011-06-12 DIAGNOSIS — F411 Generalized anxiety disorder: Secondary | ICD-10-CM

## 2011-06-12 DIAGNOSIS — R42 Dizziness and giddiness: Secondary | ICD-10-CM

## 2011-06-12 DIAGNOSIS — F419 Anxiety disorder, unspecified: Secondary | ICD-10-CM

## 2011-06-12 MED ORDER — OMEPRAZOLE 20 MG PO CPDR: 20.0000 mg | DELAYED_RELEASE_CAPSULE | Freq: Two times a day (BID) | ORAL | Status: DC

## 2011-06-12 MED ORDER — ESCITALOPRAM OXALATE 10 MG PO TABS: 10.0000 mg | ORAL_TABLET | Freq: Every day | ORAL | Status: DC

## 2011-06-12 NOTE — Progress Notes (Signed)
  [facility]:  Office Visit  Chief Complaint:  Chief Complaint  Patient presents with  . Nausea  . Diarrhea    HPI: Omar Phillips is a 46 y.o. male who complains of: 1. Reflux-rumbling stomach, acid taste in mouth, certain foods trigger it ie greasy and fast foods. Tried Prilosec OTC . NO abdominal pain, + dull epigastric pain, HAs had normal cardiac workup, h/o anxiety. Denies tobacco or etoh , decrease use of tomato products. Does take in caffeine.   2. Anxiety-fear of health issues. Was on lexapro 10 mg daily with relief. He works for [employer]. Denies any mania, SI/HI or hallucinations, depression  3. Dizziness-not eating properly, no regular meals, can be in any position. Measures blood pressure all the time. He states it might be associated with his lack of eating and also anxiety  Past Medical History  Diagnosis Date  . Hypertension   . Anxiety   . GERD (gastroesophageal reflux disease)    No past surgical history on file. History   Social History  . Marital Status: Married    Spouse Name: N/A    Number of Children: N/A  . Years of Education: N/A   Social History Main Topics  . Smoking status: Never Smoker   . Smokeless tobacco: None  . Alcohol Use: No  . Drug Use: No  . Sexually Active:    Other Topics Concern  . None   Social History Narrative  . None   No family history on file. No Known Allergies Prior to Admission medications   Medication Sig Start Date End Date Taking? Authorizing Provider  lisinopril (PRINIVIL,ZESTRIL) 10 MG tablet Take 10 mg by mouth daily.   Yes Historical Provider, MD  HYDROcodone-homatropine (HYDROMET) 5-1.5 MG/5ML syrup Take by mouth every 6 (six) hours as needed.      Historical Provider, MD     ROS: The patient denies fevers, chills, night sweats, unintentional weight loss, chest pain, palpitations, wheezing, dyspnea on exertion, nausea, vomiting, abdominal pain, dysuria, hematuria, melena, numbness,  weakness, or tingling. + Epigastric pain  All other systems have been reviewed and were otherwise negative with the exception of those mentioned in the HPI and as above.    PHYSICAL EXAM: Filed Vitals:   06/12/11 1105  BP: 143/85  Pulse: 75  Temp: 98.3 F (36.8 C)  Resp: 18   Filed Vitals:   06/12/11 1105  Height: 6' (1.829 m)  Weight: 192 lb (87.091 kg)   Body mass index is 26.04 kg/(m^2).  General: Alert, no acute distress, mildly anxious HEENT:  Normocephalic, atraumatic, oropharynx patent.  Cardiovascular:  Regular rate and rhythm, no rubs murmurs or gallops.  No Carotid bruits, radial pulse intact. No pedal edema.  Respiratory: Clear to auscultation bilaterally.  No wheezes, rales, or rhonchi.  No cyanosis, no use of accessory musculature GI: No organomegaly, abdomen is soft and non-tender, positive bowel sounds.  No masses. Skin: No rashes. Neurologic: Facial musculature symmetric. Psychiatric: Patient is appropriate throughout our interaction. Lymphatic: No cervical lymphadenopathy Musculoskeletal: Gait intact.  EKG/XRAY:   Primary read interpreted by Dr. Conley Rolls at [facility].   ASSESSMENT/PLAN: Encounter Diagnoses  Name Primary?  . GERD (gastroesophageal reflux disease)   . Anxiety   . Dizziness    OTC Prilosec 20 mg BID Rx:  Lexapro 10 mg daily F/u in 1 month   Griselda Tosh PHUONG, DO 06/12/2011 2:16 PM

## 2011-06-13 ENCOUNTER — Encounter: Payer: Self-pay | Admitting: Cardiology

## 2011-06-21 ENCOUNTER — Inpatient Hospital Stay (HOSPITAL_COMMUNITY): Admission: RE | Admit: 2011-06-21 | Payer: 59 | Source: Ambulatory Visit

## 2011-07-23 ENCOUNTER — Emergency Department (HOSPITAL_COMMUNITY)
Admission: EM | Admit: 2011-07-23 | Discharge: 2011-07-23 | Disposition: A | Discharge status: Home / Self Care | Payer: 59 | Attending: Emergency Medicine | Admitting: Emergency Medicine

## 2011-07-23 ENCOUNTER — Encounter (HOSPITAL_COMMUNITY): Payer: Self-pay | Admitting: *Deleted

## 2011-07-23 DIAGNOSIS — I10 Essential (primary) hypertension: Secondary | ICD-10-CM | POA: Insufficient documentation

## 2011-07-23 DIAGNOSIS — K219 Gastro-esophageal reflux disease without esophagitis: Secondary | ICD-10-CM | POA: Insufficient documentation

## 2011-07-23 DIAGNOSIS — R110 Nausea: Secondary | ICD-10-CM | POA: Insufficient documentation

## 2011-07-23 DIAGNOSIS — F411 Generalized anxiety disorder: Secondary | ICD-10-CM | POA: Insufficient documentation

## 2011-07-23 MED ORDER — ONDANSETRON 4 MG PO TBDP
8.0000 mg | ORAL_TABLET | Freq: Once | ORAL | Status: AC
  Administered 2011-07-23: 8 mg via ORAL
  Filled 2011-07-23: qty 2

## 2011-07-23 MED ORDER — ONDANSETRON 8 MG PO TBDP: 8.0000 mg | ORAL_TABLET | Freq: Once | ORAL | Status: AC

## 2011-07-23 NOTE — ED Provider Notes (Signed)
History     CSN: [id number]  Arrival date & time 07/23/11  0010   First MD Initiated Contact with Patient 07/23/11 [phone]      Chief Complaint  Patient presents with  . feeling bad     (Consider location/radiation/quality/duration/timing/severity/associated sxs/prior treatment) HPI Comments: Patient here with a day history of high blood pressure. States was visiting his mother who was recently ill on Friday, had been feeling well today until this evening when he began to have nausea and checked his blood pressure. States that his blood pressure was quite elevated. States that just started on Lexapro in March and one of the side effects is nausea associated initially thought that this was related to the Lexapro. Now believes it may be related to blood pressure being elevated and recent exposure to gastroenteritis. Denies headache neck pain fever chills abdominal pain.  Patient is a 46 y.o. male presenting with hypertension. The history is provided by the patient. No language interpreter was used.  Hypertension This is a new problem. The current episode started yesterday. The problem occurs constantly. The problem has been unchanged. Associated symptoms include nausea. Pertinent negatives include no abdominal pain, anorexia, arthralgias, change in bowel habit, chest pain, chills, congestion, coughing, diaphoresis, fatigue, fever, headaches, joint swelling, myalgias, neck pain, numbness, rash, sore throat, swollen glands, urinary symptoms, vertigo, visual change, vomiting or weakness. The symptoms are aggravated by nothing. He has tried nothing for the symptoms. The treatment provided no relief.    Past Medical History  Diagnosis Date  . Hypertension   . Anxiety   . GERD (gastroesophageal reflux disease)     No past surgical history on file.  No family history on file.  History  Substance Use Topics  . Smoking status: Never Smoker   . Smokeless tobacco: Not on file  . Alcohol Use: No        Review of Systems  Constitutional: Negative for fever, chills, diaphoresis and fatigue.  HENT: Negative for congestion, sore throat and neck pain.   Respiratory: Negative for cough.   Cardiovascular: Negative for chest pain.  Gastrointestinal: Positive for nausea. Negative for vomiting, abdominal pain, anorexia and change in bowel habit.  Musculoskeletal: Negative for myalgias, joint swelling and arthralgias.  Skin: Negative for rash.  Neurological: Negative for vertigo, weakness, numbness and headaches.  All other systems reviewed and are negative.    Allergies  Review of patient's allergies indicates no known allergies.  Home Medications   Current Outpatient Rx  Name Route Sig Dispense Refill  . ESCITALOPRAM OXALATE 10 MG PO TABS Oral Take 1 tablet (10 mg total) by mouth daily. 30 tablet 3  . LISINOPRIL 10 MG PO TABS Oral Take 10 mg by mouth daily.    . ADULT MULTIVITAMIN W/MINERALS CH Oral Take 1 tablet by mouth daily.      BP 132/90  Pulse 64  Temp(Src) 97.7 F (36.5 C) (Oral)  Resp 18  SpO2 99%  Physical Exam  Nursing note and vitals reviewed. Constitutional: He is oriented to person, place, and time. He appears well-developed and well-nourished. No distress.  HENT:  Head: Normocephalic and atraumatic.  Right Ear: External ear normal.  Left Ear: External ear normal.  Nose: Nose normal.  Mouth/Throat: Oropharynx is clear and moist. No oropharyngeal exudate.  Eyes: Conjunctivae are normal. Pupils are equal, round, and reactive to light. No scleral icterus.  Neck: Normal range of motion. Neck supple.  Cardiovascular: Normal rate, regular rhythm and normal heart sounds.  Exam  reveals no gallop and no friction rub.   No murmur heard. Pulmonary/Chest: Effort normal and breath sounds normal. No respiratory distress. He has no wheezes. He has no rales. He exhibits no tenderness.  Abdominal: Soft. Bowel sounds are normal. He exhibits no distension. There is no  tenderness.  Musculoskeletal: Normal range of motion. He exhibits no edema and no tenderness.  Lymphadenopathy:    He has no cervical adenopathy.  Neurological: He is alert and oriented to person, place, and time. No cranial nerve deficit.  Skin: Skin is warm and dry. No rash noted. No erythema. No pallor.  Psychiatric: He has a normal mood and affect. His behavior is normal. Judgment and thought content normal.    ED Course  Procedures (including critical care time)  Labs Reviewed - No data to display No results found.   Hypertension Nausea    MDM  Patient feels better after Zofran ODT. Will prescribe same. Do not believe there is acute medical emergency. Patient will return if needed        Scarlette Calico C. [place], [place] 07/23/11 [phone]

## 2011-07-23 NOTE — ED Provider Notes (Signed)
Medical screening examination/treatment/procedure(s) were performed by non-physician practitioner and as supervising physician I was immediately available for consultation/collaboration.   Vida Roller, MD 07/23/11 [phone]

## 2011-07-23 NOTE — ED Notes (Signed)
The pts bp is high and her back is burning tonight

## 2011-07-23 NOTE — Discharge Instructions (Signed)
Arterial Hypertension Arterial hypertension (high blood pressure) is a condition of elevated pressure in your blood vessels. Hypertension over a long period of time is a risk factor for strokes, heart attacks, and heart failure. It is also the leading cause of kidney (renal) failure.  CAUSES   In Adults -- Over 90% of all hypertension has no known cause. This is called essential or primary hypertension. In the other 10% of people with hypertension, the increase in blood pressure is caused by another disorder. This is called secondary hypertension. Important causes of secondary hypertension are:   Heavy alcohol use.   Obstructive sleep apnea.   Hyperaldosterosim (Conn's syndrome).   Steroid use.   Chronic kidney failure.   Hyperparathyroidism.   Medications.   Renal artery stenosis.   Pheochromocytoma.   Cushing's disease.   Coarctation of the aorta.   Scleroderma renal crisis.   Licorice (in excessive amounts).   Drugs (cocaine, methamphetamine).  Your caregiver can explain any items above that apply to you.  In Children -- Secondary hypertension is more common and should always be considered.   Pregnancy -- Few women of childbearing age have high blood pressure. However, up to 10% of them develop hypertension of pregnancy. Generally, this will not harm the woman. It Even be a sign of 3 complications of pregnancy: preeclampsia, HELLP syndrome, and eclampsia. Follow up and control with medication is necessary.  SYMPTOMS   This condition normally does not produce any noticeable symptoms. It is usually found during a routine exam.   Malignant hypertension is a late problem of high blood pressure. It Monger have the following symptoms:   Headaches.   Blurred vision.   End-organ damage (this means your kidneys, heart, lungs, and other organs are being damaged).   Stressful situations can increase the blood pressure. If a person with normal blood pressure has their blood  pressure go up while being seen by their caregiver, this is often termed "white coat hypertension." Its importance is not known. It Alkire be related with eventually developing hypertension or complications of hypertension.   Hypertension is often confused with mental tension, stress, and anxiety.  DIAGNOSIS  The diagnosis is made by 3 separate blood pressure measurements. They are taken at least 1 week apart from each other. If there is organ damage from hypertension, the diagnosis Lemire be made without repeat measurements. Hypertension is usually identified by having blood pressure readings:  Above 140/90 mmHg measured in both arms, at 3 separate times, over a couple weeks.   Over 130/80 mmHg should be considered a risk factor and Grisanti require treatment in patients with diabetes.  Blood pressure readings over 120/80 mmHg are called "pre-hypertension" even in non-diabetic patients. To get a true blood pressure measurement, use the following guidelines. Be aware of the factors that can alter blood pressure readings.  Take measurements at least 1 hour after caffeine.   Take measurements 30 minutes after smoking and without any stress. This is another reason to quit smoking - it raises your blood pressure.   Use a proper cuff size. Ask your caregiver if you are not sure about your cuff size.   Most home blood pressure cuffs are automatic. They will measure systolic and diastolic pressures. The systolic pressure is the pressure reading at the start of sounds. Diastolic pressure is the pressure at which the sounds disappear. If you are elderly, measure pressures in multiple postures. Try sitting, lying or standing.   Sit at rest for a minimum of   5 minutes before taking measurements.   You should not be on any medications like decongestants. These are found in many cold medications.   Record your blood pressure readings and review them with your caregiver.  If you have hypertension:  Your caregiver  may do tests to be sure you do not have secondary hypertension (see "causes" above).   Your caregiver may also look for signs of metabolic syndrome. This is also called Syndrome X or Insulin Resistance Syndrome. You may have this syndrome if you have type 2 diabetes, abdominal obesity, and abnormal blood lipids in addition to hypertension.   Your caregiver will take your medical and family history and perform a physical exam.   Diagnostic tests may include blood tests (for glucose, cholesterol, potassium, and kidney function), a urinalysis, or an EKG. Other tests may also be necessary depending on your condition.  PREVENTION  There are important lifestyle issues that you can adopt to reduce your chance of developing hypertension:  Maintain a normal weight.   Limit the amount of salt (sodium) in your diet.   Exercise often.   Limit alcohol intake.   Get enough potassium in your diet. Discuss specific advice with your caregiver.   Follow a DASH diet (dietary approaches to stop hypertension). This diet is rich in fruits, vegetables, and low-fat dairy products, and avoids certain fats.  PROGNOSIS  Essential hypertension cannot be cured. Lifestyle changes and medical treatment can lower blood pressure and reduce complications. The prognosis of secondary hypertension depends on the underlying cause. Many people whose hypertension is controlled with medicine or lifestyle changes can live a normal, healthy life.  RISKS AND COMPLICATIONS  While high blood pressure alone is not an illness, it often requires treatment due to its short- and long-term effects on many organs. Hypertension increases your risk for:  CVAs or strokes (cerebrovascular accident).   Heart failure due to chronically high blood pressure (hypertensive cardiomyopathy).   Heart attack (myocardial infarction).   Damage to the retina (hypertensive retinopathy).   Kidney failure (hypertensive nephropathy).  Your caregiver can  explain list items above that apply to you. Treatment of hypertension can significantly reduce the risk of complications. TREATMENT   For overweight patients, weight loss and regular exercise are recommended. Physical fitness lowers blood pressure.   Mild hypertension is usually treated with diet and exercise. A diet rich in fruits and vegetables, fat-free dairy products, and foods low in fat and salt (sodium) can help lower blood pressure. Decreasing salt intake decreases blood pressure in a 1/3 of people.   Stop smoking if you are a smoker.  The steps above are highly effective in reducing blood pressure. While these actions are easy to suggest, they are difficult to achieve. Most patients with moderate or severe hypertension end up requiring medications to bring their blood pressure down to a normal level. There are several classes of medications for treatment. Blood pressure pills (antihypertensives) will lower blood pressure by their different actions. Lowering the blood pressure by 10 mmHg may decrease the risk of complications by as much as 25%. The goal of treatment is effective blood pressure control. This will reduce your risk for complications. Your caregiver will help you determine the best treatment for you according to your lifestyle. What is excellent treatment for one person, may not be for you. HOME CARE INSTRUCTIONS   Do not smoke.   Follow the lifestyle changes outlined in the "Prevention" section.   If you are on medications, follow the directions   carefully. Blood pressure medications must be taken as prescribed. Skipping doses reduces their benefit. It also puts you at risk for problems.   Follow up with your caregiver, as directed.   If you are asked to monitor your blood pressure at home, follow the guidelines in the "Diagnosis" section above.  SEEK MEDICAL CARE IF:   You think you are having medication side effects.   You have recurrent headaches or lightheadedness.     You have swelling in your ankles.   You have trouble with your vision.  SEEK IMMEDIATE MEDICAL CARE IF:   You have sudden onset of chest pain or pressure, difficulty breathing, or other symptoms of a heart attack.   You have a severe headache.   You have symptoms of a stroke (such as sudden weakness, difficulty speaking, difficulty walking).  MAKE SURE YOU:   Understand these instructions.   Will watch your condition.   Will get help right away if you are not doing well or get worse.  Document Released: 03/27/2005 Document Revised: 03/16/2011 Document Reviewed: 10/25/2006 [facility] Patient Information 2012 [place].Hypertension Information As your heart beats, it forces blood through your arteries. This force is your blood pressure. If the pressure is too high, it is called hypertension (HTN) or high blood pressure. HTN is dangerous because you may have it and not know it. High blood pressure may mean that your heart has to work harder to pump blood. Your arteries may be narrow or stiff. The extra work puts you at risk for heart disease, stroke, and other problems.  Blood pressure consists of two numbers, a higher number over a lower, 110/72, for example. It is stated as "110 over 72." The ideal is below 120 for the top number (systolic) and under 80 for the bottom (diastolic).  You should pay close attention to your blood pressure if you have certain conditions such as:  Heart failure.   Prior heart attack.   Diabetes   Chronic kidney disease.   Prior stroke.   Multiple risk factors for heart disease.  To see if you have HTN, your blood pressure should be measured while you are seated with your arm held at the level of the heart. It should be measured at least twice. A one-time elevated blood pressure reading (especially in the Emergency Department) does not mean that you need treatment. There may be conditions in which the blood pressure is different between your right  and left arms. It is important to see your caregiver soon for a recheck. Most people have essential hypertension which means that there is not a specific cause. This type of high blood pressure may be lowered by changing lifestyle factors such as:  Stress.   Smoking.   Lack of exercise.   Excessive weight.   Drug/tobacco/alcohol use.   Eating less salt.  Most people do not have symptoms from high blood pressure until it has caused damage to the body. Effective treatment can often prevent, delay or reduce that damage. TREATMENT  Treatment for high blood pressure, when a cause has been identified, is directed at the cause. There are a large number of medications to treat HTN. These fall into several categories, and your caregiver will help you select the medicines that are best for you. Medications may have side effects. You should review side effects with your caregiver. If your blood pressure stays high after you have made lifestyle changes or started on medicines,   Your medication(s) may need to be  changed.   Other problems may need to be addressed.   Be certain you understand your prescriptions, and know how and when to take your medicine.   Be sure to follow up with your caregiver within the time frame advised (usually within two weeks) to have your blood pressure rechecked and to review your medications.   If you are taking more than one medicine to lower your blood pressure, make sure you know how and at what times they should be taken. Taking two medicines at the same time can result in blood pressure that is too low.  Document Released: 05/30/2005 Document Revised: 12/07/2010 Document Reviewed: 06/06/2007 [facility] Patient Information 2012 [place], [place].Nausea, Adult Nausea is the feeling that you have an upset stomach or have to vomit. Nausea by itself is not likely a serious concern, but it may be an early sign of more serious medical problems. As nausea gets worse, it can  lead to vomiting. If vomiting develops, there is the risk of dehydration.  CAUSES   Viral infections.   Food poisoning.   Medicines.   Pregnancy.   Motion sickness.   Migraine headaches.   Emotional distress.   Severe pain from any source.   Alcohol intoxication.  HOME CARE INSTRUCTIONS  Get plenty of rest.   Ask your caregiver about specific rehydration instructions.   Eat small amounts of food and sip liquids more often.   Take all medicines as told by your caregiver.  SEEK MEDICAL CARE IF:  You have not improved after 2 days, or you get worse.   You have a headache.  SEEK IMMEDIATE MEDICAL CARE IF:   You have a fever.   You faint.   You keep vomiting or have blood in your vomit.   You are extremely weak or dehydrated.   You have dark or bloody stools.   You have severe chest or abdominal pain.  MAKE SURE YOU:  Understand these instructions.   Will watch your condition.   Will get help right away if you are not doing well or get worse.  Document Released: 05/04/2004 Document Revised: 03/16/2011 Document Reviewed: 12/07/2010 [facility] Patient Information 2012 [place], [place].

## 2011-07-25 ENCOUNTER — Telehealth: Payer: Self-pay

## 2011-07-25 MED ORDER — ESCITALOPRAM OXALATE 10 MG PO TABS: 10.0000 mg | ORAL_TABLET | Freq: Every day | ORAL | Status: DC

## 2011-07-25 NOTE — Telephone Encounter (Signed)
Pt requesting BRAND NAME Lexapro 3 months the generic does not work as well  Loss adjuster, chartered

## 2011-07-25 NOTE — Telephone Encounter (Signed)
lex

## 2011-07-25 NOTE — Telephone Encounter (Signed)
Patient is requesting lexapro BRAND name  Generic is not working  Loss adjuster, chartered

## 2011-07-25 NOTE — Telephone Encounter (Signed)
Brand NAME product sent. Please advise patient.

## 2011-07-26 NOTE — Telephone Encounter (Signed)
Called patient but voicemail is full and cannot except messages at this time

## 2011-07-27 NOTE — Telephone Encounter (Signed)
Voicemail full and will not accept messages at this time.

## 2011-07-27 NOTE — Telephone Encounter (Signed)
Gave pt message that brand name of Lexapro was sent in. Pt thanked [place]

## 2011-11-10 ENCOUNTER — Other Ambulatory Visit: Payer: Self-pay | Admitting: Family Medicine

## 2012-02-16 ENCOUNTER — Other Ambulatory Visit: Payer: Self-pay | Admitting: Physician Assistant

## 2012-05-03 ENCOUNTER — Other Ambulatory Visit: Payer: Self-pay | Admitting: Physician Assistant

## 2012-05-03 NOTE — Telephone Encounter (Signed)
Needs office visit.

## 2012-06-12 ENCOUNTER — Other Ambulatory Visit: Payer: Self-pay | Admitting: Physician Assistant

## 2012-06-13 ENCOUNTER — Ambulatory Visit (INDEPENDENT_AMBULATORY_CARE_PROVIDER_SITE_OTHER): Payer: 59 | Admitting: Emergency Medicine

## 2012-06-13 ENCOUNTER — Telehealth: Payer: Self-pay | Admitting: Radiology

## 2012-06-13 VITALS — BP 150/100 | HR 57 | Temp 97.6°F | Resp 16 | Ht 72.0 in | Wt 202.0 lb

## 2012-06-13 DIAGNOSIS — I10 Essential (primary) hypertension: Secondary | ICD-10-CM | POA: Insufficient documentation

## 2012-06-13 DIAGNOSIS — F411 Generalized anxiety disorder: Secondary | ICD-10-CM

## 2012-06-13 DIAGNOSIS — N529 Male erectile dysfunction, unspecified: Secondary | ICD-10-CM

## 2012-06-13 MED ORDER — TADALAFIL 20 MG PO TABS: 10.0000 mg | ORAL_TABLET | ORAL | Status: DC | PRN

## 2012-06-13 MED ORDER — LISINOPRIL 10 MG PO TABS: 10.0000 mg | ORAL_TABLET | Freq: Every day | ORAL | Status: DC

## 2012-06-13 NOTE — Patient Instructions (Signed)
Hypertension As your heart beats, it forces blood through your arteries. This force is your blood pressure. If the pressure is too high, it is called hypertension (HTN) or high blood pressure. HTN is dangerous because you may have it and not know it. High blood pressure may mean that your heart has to work harder to pump blood. Your arteries may be narrow or stiff. The extra work puts you at risk for heart disease, stroke, and other problems.  Blood pressure consists of two numbers, a higher number over a lower, 110/72, for example. It is stated as "110 over 72." The ideal is below 120 for the top number (systolic) and under 80 for the bottom (diastolic). Write down your blood pressure today. You should pay close attention to your blood pressure if you have certain conditions such as:  Heart failure.  Prior heart attack.  Diabetes  Chronic kidney disease.  Prior stroke.  Multiple risk factors for heart disease. To see if you have HTN, your blood pressure should be measured while you are seated with your arm held at the level of the heart. It should be measured at least twice. A one-time elevated blood pressure reading (especially in the Emergency Department) does not mean that you need treatment. There may be conditions in which the blood pressure is different between your right and left arms. It is important to see your caregiver soon for a recheck. Most people have essential hypertension which means that there is not a specific cause. This type of high blood pressure may be lowered by changing lifestyle factors such as:  Stress.  Smoking.  Lack of exercise.  Excessive weight.  Drug/tobacco/alcohol use.  Eating less salt. Most people do not have symptoms from high blood pressure until it has caused damage to the body. Effective treatment can often prevent, delay or reduce that damage. TREATMENT  When a cause has been identified, treatment for high blood pressure is directed at the  cause. There are a large number of medications to treat HTN. These fall into several categories, and your caregiver will help you select the medicines that are best for you. Medications may have side effects. You should review side effects with your caregiver. If your blood pressure stays high after you have made lifestyle changes or started on medicines,   Your medication(s) may need to be changed.  Other problems may need to be addressed.  Be certain you understand your prescriptions, and know how and when to take your medicine.  Be sure to follow up with your caregiver within the time frame advised (usually within two weeks) to have your blood pressure rechecked and to review your medications.  If you are taking more than one medicine to lower your blood pressure, make sure you know how and at what times they should be taken. Taking two medicines at the same time can result in blood pressure that is too low. SEEK IMMEDIATE MEDICAL CARE IF:  You develop a severe headache, blurred or changing vision, or confusion.  You have unusual weakness or numbness, or a faint feeling.  You have severe chest or abdominal pain, vomiting, or breathing problems. MAKE SURE YOU:   Understand these instructions.  Will watch your condition.  Will get help right away if you are not doing well or get worse. Document Released: 03/27/2005 Document Revised: 06/19/2011 Document Reviewed: 11/15/2007 [facility] Patient Information 2013 [place], [place]. Erectile Dysfunction Erectile dysfunction (ED) is the inability to get a good enough erection to have  sexual intercourse. ED may involve:  Inability to get an erection.  Lack of enough hardness to allow penetration.  Loss of the erection before sex is finished.  Premature ejaculation.  Any combination of these problems if they occur more than 25% of the time. CAUSES  Certain drugs, such as:  Pain  relievers.  Antihistamines.  Antidepressants.  Blood pressure medicines.  Water pills.  Ulcer medicines.  Muscle relaxants.  Illegal drugs.  Excessive drinking.  Psychological causes, such as:  Anxiety.  Depression.  Sadness.  Exhaustion.  Performance fear.  Stress.  Physical causes, such as:  Artery problems. This may include diabetes, smoking, liver disease, or atherosclerosis.  High blood pressure.  Hormonal problems, such as low testosterone.  Obesity.  Nerve problems. This may include back or pelvic injuries, diabetes, multiple sclerosis, Parkinson's disease, or some surgeries. SYMPTOMS  Inability to get an erection.  Lack of enough hardness to allow penetration.  Loss of the erection before sex is finished.  Premature ejaculation.  Normal erections at some times, but with frequent unsatisfactory episodes.  Orgasms that are not satisfactory in sensation or frequency.  Low sexual satisfaction in either partner because of erection problems.  A curved penis occurring with erection. The curve may cause pain or may be too curved to allow for intercourse.  Never having nighttime erections. DIAGNOSIS Your caregiver can often diagnose this condition by:  Performing a physical exam to find other diseases or specific problems with the penis.  Asking you detailed questions about the problem.  Performing blood tests to check for diabetes or to measure hormone levels.  Performing urine tests to find other underlying health conditions.  Performing an ultrasound to check for scarring.  Performing a test to check blood flow to the penis.  Doing a sleep study at home to measure nighttime erections. TREATMENT   You may be prescribed medicines by mouth.  You may be given medicine injections into the penis.  You may be prescribed a vacuum pump with a ring.  Penile implant surgery may be performed. You may receive:  An inflatable implant.  A  semi-rigid implant.  Blood vessel surgery may be performed. HOME CARE INSTRUCTIONS  Take all medicine as directed by your caregiver. Do not take any other medicines without talking to your caregiver first.  Follow your caregiver's directions for specific treatments as prescribed.  Follow up with your caregiver as directed. Document Released: 03/24/2000 Document Revised: 06/19/2011 Document Reviewed: 07/17/2010 [facility] Patient Information 2013 [place], [place].

## 2012-06-13 NOTE — Progress Notes (Signed)
  Subjective:    Patient ID: Omar Phillips, male    DOB: 1966/01/13, 47 y.o.   MRN: [id number]  HPI Pt presents for med refills on BP meds. He has been out for three days. Checks BP at home. Last night it was 146/90's. He exercises regularly. Patient 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 He remains physically active and tries to work as much as he can.  BP is now 128/86.      Review of Systems he has had some recent problems with ED which he feels are medication related.     Objective:   Physical Exam HEENT exam is unremarkable. Neck is supple chest is clear to auscultation and percussion. Heart regular rate no murmurs.       Assessment & Plan:  RF on meds. Watch salt intake and keep exercising. We'll also add Cialis and see if that will help with ED

## 2012-06-13 NOTE — Telephone Encounter (Signed)
Patient advised he needs office visit for renewals on his meds.

## 2012-08-16 ENCOUNTER — Encounter: Payer: 59 | Admitting: Family Medicine

## 2013-04-14 ENCOUNTER — Other Ambulatory Visit: Payer: Self-pay | Admitting: Emergency Medicine

## 2013-05-26 ENCOUNTER — Ambulatory Visit (INDEPENDENT_AMBULATORY_CARE_PROVIDER_SITE_OTHER): Payer: 59 | Admitting: Physician Assistant

## 2013-05-26 VITALS — BP 148/90 | HR 67 | Temp 97.2°F | Resp 18 | Ht 72.0 in | Wt 202.0 lb

## 2013-05-26 DIAGNOSIS — I10 Essential (primary) hypertension: Secondary | ICD-10-CM

## 2013-05-26 DIAGNOSIS — D229 Melanocytic nevi, unspecified: Secondary | ICD-10-CM

## 2013-05-26 DIAGNOSIS — D239 Other benign neoplasm of skin, unspecified: Secondary | ICD-10-CM

## 2013-05-26 LAB — COMPREHENSIVE METABOLIC PANEL
ALT: 34 U/L (ref 0–53)
AST: 23 U/L (ref 0–37)
Albumin: 4.5 g/dL (ref 3.5–5.2)
Alkaline Phosphatase: 80 U/L (ref 39–117)
BUN: 11 mg/dL (ref 6–23)
CO2: 29 mEq/L (ref 19–32)
Calcium: 10 mg/dL (ref 8.4–10.5)
Chloride: 100 mEq/L (ref 96–112)
Creat: 0.96 mg/dL (ref 0.50–1.35)
Glucose, Bld: 104 mg/dL — ABNORMAL HIGH (ref 70–99)
Potassium: 4.6 mEq/L (ref 3.5–5.3)
Sodium: 136 mEq/L (ref 135–145)
Total Bilirubin: 0.5 mg/dL (ref 0.2–1.2)
Total Protein: 7.8 g/dL (ref 6.0–8.3)

## 2013-05-26 MED ORDER — LISINOPRIL 10 MG PO TABS: 10.0000 mg | ORAL_TABLET | Freq: Every day | ORAL | Status: DC

## 2013-05-26 NOTE — Patient Instructions (Addendum)
Please take your medications EVERY DAY and return BEFORE you run out! Please resume the aspirin every day.  I will contact you with your lab results as soon as they are available.   If you have not heard from me in 2 weeks, please contact me.  The fastest way to get your results is to register for My Chart (see the instructions on the last page of this printout).  If the scheduling team hasn't contacted you in 1 week to set up your follow-up and complete physical in 6 months, please contact the office.

## 2013-05-26 NOTE — Progress Notes (Signed)
Subjective:    Patient ID: Omar Phillips, male    DOB: [birth year], 48 y.o.   MRN: [id number]  HPI Ran out of lisinopril about one week ago and BP is elevated.  Took 81 mg ASA for two doses as old school remedy to thin the blood.  BP on medication ranges 120-130's/80's.   Reports spot on his back close to the right shoulder blade.  Present for years.  Wife wants it checked.  No pain, itching, bleeding.  Denies change in how it looks.   Review of Systems  Constitutional: Negative for appetite change and unexpected weight change.  Eyes: Negative for visual disturbance.  Respiratory: Negative for cough, chest tightness and shortness of breath.   Cardiovascular: Negative for chest pain, palpitations and leg swelling.  Gastrointestinal: Negative.   Genitourinary: Negative.   Musculoskeletal: Negative.   Skin:       As above  Neurological: Positive for headaches (from elevated BP over the past week). Negative for dizziness, syncope, weakness, light-headedness and numbness.      Objective:   Physical Exam  Constitutional: He is oriented to person, place, and time. He appears well-developed and well-nourished. No distress.  HENT:  Head: Normocephalic and atraumatic.  Eyes: Conjunctivae and EOM are normal. Right conjunctiva is not injected. Left conjunctiva is not injected. No scleral icterus.  Neck: Normal range of motion. Neck supple. No thyromegaly present.  Cardiovascular: Normal rate, regular rhythm and normal heart sounds.  Exam reveals no gallop and no friction rub.   No murmur heard. Pulses:      Radial pulses are 2+ on the right side, and 2+ on the left side.  Pulmonary/Chest: Effort normal and breath sounds normal.  Musculoskeletal: He exhibits no edema.  Lymphadenopathy:       Head (right side): No tonsillar, no preauricular, no posterior auricular and no occipital adenopathy present.       Head (left side): No tonsillar, no preauricular, no posterior auricular and no  occipital adenopathy present.    He has no cervical adenopathy.       Right: No supraclavicular adenopathy present.       Left: No supraclavicular adenopathy present.  Neurological: He is alert and oriented to person, place, and time.  Sensation intact BLE, normal hair growth pattern  Skin: Skin is warm, dry and intact.     Psychiatric: He has a normal mood and affect. His behavior is normal. Judgment and thought content normal.      Assessment & Plan:   1. Hypertension - controlled on current regimen, will refill for 6 months, RTC in 6 months for complete physical, patient requested change of PCP to [facility].  - Comprehensive metabolic panel - lisinopril (PRINIVIL,ZESTRIL) 10 MG tablet; Take 1 tablet (10 mg total) by mouth daily.  Dispense: 90 tablet; Refill: 1  2. Nevus No biopsy needed at this time.  Continue to monitor.  If grows in size or changes in color, etc, plan biopsy.  Patient Instructions  Please take your medications EVERY DAY and return BEFORE you run out! Please resume the aspirin every day.  I will contact you with your lab results as soon as they are available.   If you have not heard from me in 2 weeks, please contact me.  The fastest way to get your results is to register for My Chart (see the instructions on the last page of this printout).  If the scheduling team hasn't contacted you in 1 week to set  up your follow-up and complete physical in 6 months, please contact the office.  Patient instructed to have wife watch the mole on the left mid back and RTC with size increase, color change, itching, bleeding, or any concerns.

## 2013-05-26 NOTE — Progress Notes (Signed)
I have examined this patient along with the student and agree.  Nevus on the LEFT back circled to identify it to his wife for monitoring.

## 2013-05-27 ENCOUNTER — Encounter: Payer: Self-pay | Admitting: Physician Assistant

## 2013-07-25 ENCOUNTER — Encounter (HOSPITAL_COMMUNITY): Payer: Self-pay | Admitting: Emergency Medicine

## 2013-07-25 ENCOUNTER — Emergency Department (HOSPITAL_COMMUNITY)
Admission: EM | Admit: 2013-07-25 | Discharge: 2013-07-25 | Disposition: A | Discharge status: Home / Self Care | Payer: 59 | Attending: Emergency Medicine | Admitting: Emergency Medicine

## 2013-07-25 DIAGNOSIS — R112 Nausea with vomiting, unspecified: Secondary | ICD-10-CM | POA: Insufficient documentation

## 2013-07-25 DIAGNOSIS — Z79899 Other long term (current) drug therapy: Secondary | ICD-10-CM | POA: Insufficient documentation

## 2013-07-25 DIAGNOSIS — R1084 Generalized abdominal pain: Secondary | ICD-10-CM | POA: Insufficient documentation

## 2013-07-25 DIAGNOSIS — I10 Essential (primary) hypertension: Secondary | ICD-10-CM | POA: Insufficient documentation

## 2013-07-25 DIAGNOSIS — K219 Gastro-esophageal reflux disease without esophagitis: Secondary | ICD-10-CM | POA: Insufficient documentation

## 2013-07-25 DIAGNOSIS — R109 Unspecified abdominal pain: Secondary | ICD-10-CM

## 2013-07-25 DIAGNOSIS — F411 Generalized anxiety disorder: Secondary | ICD-10-CM | POA: Insufficient documentation

## 2013-07-25 LAB — CBC WITH DIFFERENTIAL/PLATELET
Basophils Absolute: 0 10*3/uL (ref 0.0–0.1)
Basophils Relative: 0 % (ref 0–1)
Eosinophils Absolute: 0.1 10*3/uL (ref 0.0–0.7)
Eosinophils Relative: 1 % (ref 0–5)
HCT: 43.1 % (ref 39.0–52.0)
Hemoglobin: 14.9 g/dL (ref 13.0–17.0)
Lymphocytes Relative: 35 % (ref 12–46)
Lymphs Abs: 2.4 10*3/uL (ref 0.7–4.0)
MCH: 31 pg (ref 26.0–34.0)
MCHC: 34.6 g/dL (ref 30.0–36.0)
MCV: 89.6 fL (ref 78.0–100.0)
Monocytes Absolute: 0.4 10*3/uL (ref 0.1–1.0)
Monocytes Relative: 7 % (ref 3–12)
Neutro Abs: 3.9 10*3/uL (ref 1.7–7.7)
Neutrophils Relative %: 57 % (ref 43–77)
Platelets: 321 10*3/uL (ref 150–400)
RBC: 4.81 MIL/uL (ref 4.22–5.81)
RDW: 12.7 % (ref 11.5–15.5)
WBC: 6.8 10*3/uL (ref 4.0–10.5)

## 2013-07-25 LAB — COMPREHENSIVE METABOLIC PANEL
ALT: 31 U/L (ref 0–53)
AST: 28 U/L (ref 0–37)
Albumin: 4.4 g/dL (ref 3.5–5.2)
Alkaline Phosphatase: 83 U/L (ref 39–117)
BUN: 14 mg/dL (ref 6–23)
CO2: 28 mEq/L (ref 19–32)
Calcium: 9.9 mg/dL (ref 8.4–10.5)
Chloride: 100 mEq/L (ref 96–112)
Creatinine, Ser: 1.17 mg/dL (ref 0.50–1.35)
GFR calc Af Amer: 85 mL/min — ABNORMAL LOW (ref >90)
GFR calc non Af Amer: 73 mL/min — ABNORMAL LOW (ref >90)
Glucose, Bld: 112 mg/dL — ABNORMAL HIGH (ref 70–99)
Potassium: 3.6 mEq/L — ABNORMAL LOW (ref 3.7–5.3)
Sodium: 142 mEq/L (ref 137–147)
Total Bilirubin: 0.4 mg/dL (ref 0.3–1.2)
Total Protein: 8.1 g/dL (ref 6.0–8.3)

## 2013-07-25 LAB — URINE MICROSCOPIC-ADD ON

## 2013-07-25 LAB — URINALYSIS, ROUTINE W REFLEX MICROSCOPIC
Bilirubin Urine: NEGATIVE
Glucose, UA: NEGATIVE mg/dL
Hgb urine dipstick: NEGATIVE
Ketones, ur: NEGATIVE mg/dL
Leukocytes, UA: NEGATIVE
Nitrite: NEGATIVE
Protein, ur: 30 mg/dL — AB
Specific Gravity, Urine: 1.025 (ref 1.005–1.030)
Urobilinogen, UA: 1 mg/dL (ref 0.0–1.0)
pH: 8 (ref 5.0–8.0)

## 2013-07-25 LAB — I-STAT TROPONIN, ED: Troponin i, poc: 0 ng/mL (ref 0.00–0.08)

## 2013-07-25 LAB — LIPASE, BLOOD: Lipase: 24 U/L (ref 11–59)

## 2013-07-25 MED ORDER — FENTANYL CITRATE 0.05 MG/ML IJ SOLN
50.0000 ug | Freq: Once | INTRAMUSCULAR | Status: AC
  Administered 2013-07-25: 50 ug via INTRAVENOUS
  Filled 2013-07-25: qty 2

## 2013-07-25 MED ORDER — ONDANSETRON HCL 4 MG/2ML IJ SOLN
4.0000 mg | Freq: Once | INTRAMUSCULAR | Status: AC
  Administered 2013-07-25: 4 mg via INTRAVENOUS
  Filled 2013-07-25: qty 2

## 2013-07-25 MED ORDER — ONDANSETRON HCL 4 MG PO TABS: 4.0000 mg | ORAL_TABLET | Freq: Three times a day (TID) | ORAL | Status: DC | PRN

## 2013-07-25 NOTE — ED Notes (Signed)
Pt reports n/v and abd cramping x2hrs - denies diarrhea or fever - pt concerned about possible food poisoning.

## 2013-07-25 NOTE — Discharge Instructions (Signed)
Abdominal Pain, Adult °Many things can cause abdominal pain. Usually, abdominal pain is not caused by a disease and will improve without treatment. It can often be observed and treated at home. Your health care provider will do a physical exam and possibly order blood tests and X-rays to help determine the seriousness of your pain. However, in many cases, more time must pass before a clear cause of the pain can be found. Before that point, your health care provider may not know if you need more testing or further treatment. °HOME CARE INSTRUCTIONS  °Monitor your abdominal pain for any changes. The following actions may help to alleviate any discomfort you are experiencing: °· Only take over-the-counter or prescription medicines as directed by your health care provider. °· Do not take laxatives unless directed to do so by your health care provider. °· Try a clear liquid diet (broth, tea, or water) as directed by your health care provider. Slowly move to a bland diet as tolerated. °SEEK MEDICAL CARE IF: °· You have unexplained abdominal pain. °· You have abdominal pain associated with nausea or diarrhea. °· You have pain when you urinate or have a bowel movement. °· You experience abdominal pain that wakes you in the night. °· You have abdominal pain that is worsened or improved by eating food. °· You have abdominal pain that is worsened with eating fatty foods. °SEEK IMMEDIATE MEDICAL CARE IF:  °· Your pain does not go away within 2 hours. °· You have a fever. °· You keep throwing up (vomiting). °· Your pain is felt only in portions of the abdomen, such as the right side or the left lower portion of the abdomen. °· You pass bloody or black tarry stools. °MAKE SURE YOU: °· Understand these instructions.   °· Will watch your condition.   °· Will get help right away if you are not doing well or get worse.   °Document Released: 01/04/2005 Document Revised: 01/15/2013 Document Reviewed: 12/04/2012 °[employer]® Patient  Information ©2014 [employer]. ° °

## 2013-07-25 NOTE — ED Notes (Signed)
MD at bedside. 

## 2013-07-25 NOTE — ED Provider Notes (Signed)
CSN: [id number]     Arrival date & time 07/25/13  0230 History   First MD Initiated Contact with Patient 07/25/13 0243     Chief Complaint  Patient presents with  . Emesis  . Abdominal Pain     (Consider location/radiation/quality/duration/timing/severity/associated sxs/prior Treatment) HPI  Patient is a 48 yo man with HTN who presents with diffuse abdominal pain which began around midnight. He has had about 4 episodes of NBNB emesis. No diarrhea. No history of abdominal surgeries. No fever.   Pain is is aching and cramping., 3/10. Last BM about 3 hours ago. No diarrhea. NO bloody stools. No GU sx.   Past Medical History  Diagnosis Date  . Hypertension   . Anxiety   . GERD (gastroesophageal reflux disease)    History reviewed. No pertinent past surgical history. Family History  Problem Relation Age of Onset  . Hypertension Mother   . Hypertension Sister    History  Substance Use Topics  . Smoking status: Never Smoker   . Smokeless tobacco: Never Used  . Alcohol Use: No    Review of Systems Ten point review of symptoms performed and is negative with the exception of symptoms noted above.     Allergies  Review of patient's allergies indicates no known allergies.  Home Medications   Prior to Admission medications   Medication Sig Start Date End Date Taking? Authorizing Provider  lisinopril (PRINIVIL,ZESTRIL) 10 MG tablet Take 1 tablet (10 mg total) by mouth daily. 05/26/13  Yes Chelle S Jeffery, PA-C   BP 148/84  Pulse 66  Temp(Src) 97.8 F (36.6 C) (Oral)  Resp 12  Ht 5\' 11"  (1.803 m)  Wt 195 lb (88.451 kg)  BMI 27.21 kg/m2  SpO2 100% Physical Exam Gen: well developed and well nourished appearing Head: NCAT Eyes: PERL, EOMI Nose: no epistaixis or rhinorrhea Mouth/throat: mucosa is moist and pink Neck: supple, no stridor Lungs: CTA B, no wheezing, rhonchi or rales CV: RRR, no murmur, extremities appear well perfused.  Abd: soft, notender,  nondistended Back: no ttp, no cva ttp Skin: warm and dry Ext: normal to inspection, no dependent edema Neuro: CN ii-xii grossly intact, no focal deficits Psyche; normal affect,  calm and cooperative.   ED Course  Procedures (including critical care time) Labs Review  Results for orders placed during the hospital encounter of 07/25/13 (from the past 24 hour(s))  CBC WITH DIFFERENTIAL     Status: None   Collection Time    07/25/13  2:48 AM      Result Value Ref Range   WBC 6.8  4.0 - 10.5 K/uL   RBC 4.81  4.22 - 5.81 MIL/uL   Hemoglobin 14.9  13.0 - 17.0 g/dL   HCT 43.1  39.0 - 52.0 %   MCV 89.6  78.0 - 100.0 fL   MCH 31.0  26.0 - 34.0 pg   MCHC 34.6  30.0 - 36.0 g/dL   RDW 12.7  11.5 - 15.5 %   Platelets 321  150 - 400 K/uL   Neutrophils Relative % 57  43 - 77 %   Neutro Abs 3.9  1.7 - 7.7 K/uL   Lymphocytes Relative 35  12 - 46 %   Lymphs Abs 2.4  0.7 - 4.0 K/uL   Monocytes Relative 7  3 - 12 %   Monocytes Absolute 0.4  0.1 - 1.0 K/uL   Eosinophils Relative 1  0 - 5 %   Eosinophils Absolute 0.1  0.0 -  0.7 K/uL   Basophils Relative 0  0 - 1 %   Basophils Absolute 0.0  0.0 - 0.1 K/uL  COMPREHENSIVE METABOLIC PANEL     Status: Abnormal   Collection Time    07/25/13  2:48 AM      Result Value Ref Range   Sodium 142  137 - 147 mEq/L   Potassium 3.6 (*) 3.7 - 5.3 mEq/L   Chloride 100  96 - 112 mEq/L   CO2 28  19 - 32 mEq/L   Glucose, Bld 112 (*) 70 - 99 mg/dL   BUN 14  6 - 23 mg/dL   Creatinine, Ser 1.17  0.50 - 1.35 mg/dL   Calcium 9.9  8.4 - 10.5 mg/dL   Total Protein 8.1  6.0 - 8.3 g/dL   Albumin 4.4  3.5 - 5.2 g/dL   AST 28  0 - 37 U/L   ALT 31  0 - 53 U/L   Alkaline Phosphatase 83  39 - 117 U/L   Total Bilirubin 0.4  0.3 - 1.2 mg/dL   GFR calc non Af Amer 73 (*) >90 mL/min   GFR calc Af Amer 85 (*) >90 mL/min  LIPASE, BLOOD     Status: None   Collection Time    07/25/13  2:48 AM      Result Value Ref Range   Lipase 24  11 - 59 U/L   EKG: nsr, no acute  ischemic changes, normal intervals, normal axis,  qrs complex notable for LVH pattern with early repol, no olds for comparison.   MDM  DDX: gastritis, PUD, GERD, pancreatitis, gallbladder disease, SBO, colitis, UTI, enteritis.    4944: Patient is feeling better on re-exam. Repeat abdominal exam is benign. We will po challenge with gingerale.   9675: Patient tolerating po challenge. Stable for discharge with antiemetic.   Elyn Peers, MD 07/25/13 [phone]

## 2013-11-10 ENCOUNTER — Ambulatory Visit: Payer: Self-pay | Admitting: Family Medicine

## 2013-12-10 ENCOUNTER — Telehealth: Payer: Self-pay

## 2013-12-10 DIAGNOSIS — I10 Essential (primary) hypertension: Secondary | ICD-10-CM

## 2013-12-10 MED ORDER — LISINOPRIL 10 MG PO TABS: 10.0000 mg | ORAL_TABLET | Freq: Every day | ORAL | Status: DC

## 2013-12-10 NOTE — Telephone Encounter (Signed)
Pt called and needs a refill for Lisinopril... He uses the [facility] on [address].He can be reached @ [phone]. Thank you

## 2013-12-10 NOTE — Telephone Encounter (Signed)
Sent in a 30 day supply Transferred pt to schedule an appt.

## 2014-01-30 ENCOUNTER — Ambulatory Visit (INDEPENDENT_AMBULATORY_CARE_PROVIDER_SITE_OTHER): Payer: 59 | Admitting: Emergency Medicine

## 2014-01-30 ENCOUNTER — Ambulatory Visit (HOSPITAL_COMMUNITY)
Admission: RE | Admit: 2014-01-30 | Discharge: 2014-01-30 | Disposition: A | Discharge status: Home / Self Care | Payer: 59 | Source: Ambulatory Visit | Attending: Emergency Medicine | Admitting: Emergency Medicine

## 2014-01-30 VITALS — BP 122/78 | HR 72 | Temp 98.2°F | Resp 17 | Ht 71.5 in | Wt 196.0 lb

## 2014-01-30 DIAGNOSIS — K219 Gastro-esophageal reflux disease without esophagitis: Secondary | ICD-10-CM

## 2014-01-30 DIAGNOSIS — R51 Headache: Secondary | ICD-10-CM | POA: Diagnosis not present

## 2014-01-30 DIAGNOSIS — G4485 Primary stabbing headache: Secondary | ICD-10-CM

## 2014-01-30 DIAGNOSIS — R42 Dizziness and giddiness: Secondary | ICD-10-CM | POA: Diagnosis not present

## 2014-01-30 DIAGNOSIS — I10 Essential (primary) hypertension: Secondary | ICD-10-CM

## 2014-01-30 MED ORDER — LISINOPRIL 10 MG PO TABS: 10.0000 mg | ORAL_TABLET | Freq: Every day | ORAL | Status: DC

## 2014-01-30 MED ORDER — ESOMEPRAZOLE MAGNESIUM 40 MG PO CPDR: 40.0000 mg | DELAYED_RELEASE_CAPSULE | Freq: Every day | ORAL | Status: DC

## 2014-01-30 NOTE — Patient Instructions (Addendum)
Go to [facility] register at radiology for Out Patient CT   Gastroesophageal Reflux Disease, Adult Gastroesophageal reflux disease (GERD) happens when acid from your stomach flows up into the esophagus. When acid comes in contact with the esophagus, the acid causes soreness (inflammation) in the esophagus. Over time, GERD may create small holes (ulcers) in the lining of the esophagus. CAUSES   Increased body weight. This puts pressure on the stomach, making acid rise from the stomach into the esophagus.  Smoking. This increases acid production in the stomach.  Drinking alcohol. This causes decreased pressure in the lower esophageal sphincter (valve or ring of muscle between the esophagus and stomach), allowing acid from the stomach into the esophagus.  Late evening meals and a full stomach. This increases pressure and acid production in the stomach.  A malformed lower esophageal sphincter. Sometimes, no cause is found. SYMPTOMS   Burning pain in the lower part of the mid-chest behind the breastbone and in the mid-stomach area. This may occur twice a week or more often.  Trouble swallowing.  Sore throat.  Dry cough.  Asthma-like symptoms including chest tightness, shortness of breath, or wheezing. DIAGNOSIS  Your caregiver may be able to diagnose GERD based on your symptoms. In some cases, X-rays and other tests may be done to check for complications or to check the condition of your stomach and esophagus. TREATMENT  Your caregiver may recommend over-the-counter or prescription medicines to help decrease acid production. Ask your caregiver before starting or adding any new medicines.  HOME CARE INSTRUCTIONS   Change the factors that you can control. Ask your caregiver for guidance concerning weight loss, quitting smoking, and alcohol consumption.  Avoid foods and drinks that make your symptoms worse, such as:  Caffeine or alcoholic drinks.  Chocolate.  Peppermint or  mint flavorings.  Garlic and onions.  Spicy foods.  Citrus fruits, such as oranges, lemons, or limes.  Tomato-based foods such as sauce, chili, salsa, and pizza.  Fried and fatty foods.  Avoid lying down for the 3 hours prior to your bedtime or prior to taking a nap.  Eat small, frequent meals instead of large meals.  Wear loose-fitting clothing. Do not wear anything tight around your waist that causes pressure on your stomach.  Raise the head of your bed 6 to 8 inches with wood blocks to help you sleep. Extra pillows will not help.  Only take over-the-counter or prescription medicines for pain, discomfort, or fever as directed by your caregiver.  Do not take aspirin, ibuprofen, or other nonsteroidal anti-inflammatory drugs (NSAIDs). SEEK IMMEDIATE MEDICAL CARE IF:   You have pain in your arms, neck, jaw, teeth, or back.  Your pain increases or changes in intensity or duration.  You develop nausea, vomiting, or sweating (diaphoresis).  You develop shortness of breath, or you faint.  Your vomit is green, yellow, black, or looks like coffee grounds or blood.  Your stool is red, bloody, or black. These symptoms could be signs of other problems, such as heart disease, gastric bleeding, or esophageal bleeding. MAKE SURE YOU:   Understand these instructions.  Will watch your condition.  Will get help right away if you are not doing well or get worse. Document Released: 01/04/2005 Document Revised: 06/19/2011 Document Reviewed: 10/14/2010 [facility] Patient Information 2015 [place], [place]. This information is not intended to replace advice given to you by your health care provider. Make sure you discuss any questions you have with your health care provider.

## 2014-01-30 NOTE — Addendum Note (Signed)
Addended by: Orion Crook on: 01/30/2014 01:25 PM   Modules accepted: Orders

## 2014-01-30 NOTE — Progress Notes (Signed)
[facility] [address] [place]  [address] [id number]  Date:  01/30/2014   Name:  Omar Phillips   DOB:  03-01-1966   MRN:  [id number]  PCP:  JEFFERY,CHELLE, PA-C    Chief Complaint: Shortness of Breath, Neck Pain, Insomnia, Chest Pain and Abdominal Pain   History of Present Illness:  Omar Phillips is a 48 y.o. very pleasant male patient who presents with the following:  Says this morning he was at the gym in the steam room and then went to the sauna.  Developed sudden sharp right eye pain lasted 5 minutes Denies any injury or straining.  No visual symptoms, no difficulty with speech or facial asymmetry. No residual headache.   Says he has an unsteady feeling not exactly like dizziness.   No numbness tingling or weakness. Has frequent acid reflux symptoms and belching.  No abdominal pain or food intolerance. No improvement with over the counter medications or other home remedies.  Denies other complaint or health concern today.    Patient Active Problem List   Diagnosis Date Noted  . Unspecified essential hypertension 06/13/2012  . SCIATICA 04/05/2007  . BACK PAIN 03/22/2007  . ANXIETY 12/07/2006    Past Medical History  Diagnosis Date  . Hypertension   . Anxiety   . GERD (gastroesophageal reflux disease)     No past surgical history on file.  History  Substance Use Topics  . Smoking status: Never Smoker   . Smokeless tobacco: Never Used  . Alcohol Use: No    Family History  Problem Relation Age of Onset  . Hypertension Mother   . Hypertension Sister     No Known Allergies  Medication list has been reviewed and updated.  Current Outpatient Prescriptions on File Prior to Visit  Medication Sig Dispense Refill  . lisinopril (PRINIVIL,ZESTRIL) 10 MG tablet Take 1 tablet (10 mg total) by mouth daily.  30 tablet  0  . ondansetron (ZOFRAN) 4 MG tablet Take 1 tablet (4 mg total) by mouth every 8 (eight) hours as needed for  nausea or vomiting.  20 tablet  0   No current facility-administered medications on file prior to visit.    Review of Systems:  As per HPI, otherwise negative.    Physical Examination: Filed Vitals:   01/30/14 1146  BP: 122/78  Pulse: 72  Temp: 98.2 F (36.8 C)  Resp: 17   Filed Vitals:   01/30/14 1146  Height: 5' 11.5" (1.816 m)  Weight: 196 lb (88.905 kg)   Body mass index is 26.96 kg/(m^2). Ideal Body Weight: Weight in (lb) to have BMI = 25: 181.4  GEN: WDWN, NAD, Non-toxic, A & O x 3 HEENT: Atraumatic, Normocephalic. Neck supple. No masses, No LAD. Ears and Nose: No external deformity. CV: RRR, No M/G/R. No JVD. No thrill. No extra heart sounds. PULM: CTA B, no wheezes, crackles, rhonchi. No retractions. No resp. distress. No accessory muscle use. ABD: S, NT, ND, +BS. No rebound. No HSM. EXTR: No c/c/e NEURO Normal gait. Romberg and tandem gait normal.  PRRERLA EOMI  CN 2-12 intact PSYCH: Normally interactive. Conversant. Not depressed or anxious appearing.  Calm demeanor.    Assessment and Plan: Dizziness GERD nexium CT head  Signed,  Ellison Carwin, MD

## 2014-01-31 ENCOUNTER — Telehealth: Payer: Self-pay

## 2014-01-31 NOTE — Telephone Encounter (Signed)
Patient called returning a call. Please call back and advise.

## 2014-02-01 NOTE — Telephone Encounter (Signed)
Pt was given results of CT this am

## 2014-06-19 ENCOUNTER — Telehealth: Payer: Self-pay

## 2014-06-19 ENCOUNTER — Other Ambulatory Visit: Payer: Self-pay | Admitting: Emergency Medicine

## 2014-06-19 NOTE — Telephone Encounter (Signed)
Can we send in a refill?

## 2014-06-19 NOTE — Telephone Encounter (Signed)
Patient needs blood pressure medication refill sent to [facility] on [address]. Patient states he doesn't have co pay money to be seen again. Patient phone [phone]

## 2014-06-20 MED ORDER — LISINOPRIL 10 MG PO TABS: 10.0000 mg | ORAL_TABLET | Freq: Every day | ORAL | Status: DC

## 2014-06-20 NOTE — Telephone Encounter (Signed)
I have refilled for a month but then he needs an OV

## 2014-06-22 NOTE — Telephone Encounter (Signed)
Notified pt of RF and need for f/up. He agreed.

## 2014-07-08 ENCOUNTER — Ambulatory Visit: Payer: Self-pay | Admitting: Medical

## 2014-07-10 ENCOUNTER — Encounter: Payer: Self-pay | Admitting: Medical

## 2014-07-10 ENCOUNTER — Ambulatory Visit (INDEPENDENT_AMBULATORY_CARE_PROVIDER_SITE_OTHER): Payer: 59 | Admitting: Medical

## 2014-07-10 VITALS — BP 118/80 | HR 63 | Temp 97.4°F | Resp 15 | Ht 72.0 in | Wt 199.0 lb

## 2014-07-10 DIAGNOSIS — F411 Generalized anxiety disorder: Secondary | ICD-10-CM | POA: Diagnosis not present

## 2014-07-10 DIAGNOSIS — I10 Essential (primary) hypertension: Secondary | ICD-10-CM | POA: Diagnosis not present

## 2014-07-10 MED ORDER — LISINOPRIL 10 MG PO TABS: 10.0000 mg | ORAL_TABLET | Freq: Every day | ORAL | Status: DC

## 2014-07-10 NOTE — Progress Notes (Signed)
Subjective: Here to establish care.  Was primarily seeing urgent care prior.    He notes hx/o hypertension, anxiety, acid reflux.  Compliant with lisinopril blood pressure medication and does fine on this.  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  In addition to normal life stress he has had increased stress with son's health care issues in the last few months. His teenage son who plays football and basketball in the last few months had a lacerated liver from football injury, ankle fracture during basketball season, and just recently had an hernia repair.  He was angry at first that these things could happen to his son but now is coming to terms with things.  He worries overall about his health, the unknown, just wants advice on this. He has been on Lunesta and Lexapro in the past for sleep and anxiety and did fine on this. Doesn't feel the need to be on medication at this time though. No current counseling.  Married, 3 children.  72yo daughter, 15yo son, 29yo daughter.  All at home.     Exercise - played college basketball.   Coaches now, avoids lots of playing basektball now being afraid of achilles tear.  Goes to [employer], swims.  Cardio - pool.   Eats healthy. No other aggravating or relieving factors. No other complaint.    Objective: BP 118/80 mmHg  Pulse 63  Temp(Src) 97.4 F (36.3 C) (Oral)  Resp 15  Ht 6' (1.829 m)  Wt 199 lb (90.266 kg)  BMI 26.98 kg/m2  General appearance: alert, no distress, WD/WN, pleasant AA male HEENT: normocephalic, sclerae anicteric, TMs pearly, nares patent, no discharge or erythema, pharynx normal Oral cavity: MMM, no lesions Neck: supple, no lymphadenopathy, no thyromegaly, no masses Heart: RRR, normal S1, S2, no murmurs Lungs: CTA bilaterally, no wheezes, rhonchi, or rales Pulses: 2+ symmetric, upper and lower extremities, normal cap refill  Assessment: Encounter Diagnoses  Name  Primary?  . Generalized anxiety disorder Yes  . Essential hypertension     Plan: GAD - discuss his prior successful treatment with Lexapro, discussed his concerns, discussed stress reduction, consider counseling, get back in church, and work to have consistent routine with healthy diet, routine exercise, work on getting good sleep.  Declines need for medication at this time.    HTN - continue current medication follow-up soon for physical

## 2014-07-20 ENCOUNTER — Encounter: Payer: Self-pay | Admitting: Urgent Care

## 2014-08-14 ENCOUNTER — Ambulatory Visit (INDEPENDENT_AMBULATORY_CARE_PROVIDER_SITE_OTHER): Payer: 59 | Admitting: Family Medicine

## 2014-08-14 ENCOUNTER — Encounter: Payer: Self-pay | Admitting: Family Medicine

## 2014-08-14 VITALS — BP 120/88 | HR 62 | Wt 198.8 lb

## 2014-08-14 DIAGNOSIS — F411 Generalized anxiety disorder: Secondary | ICD-10-CM | POA: Diagnosis not present

## 2014-08-14 NOTE — Patient Instructions (Signed)
Start exercising regularly. Get some counseling

## 2014-08-14 NOTE — Progress Notes (Signed)
   Subjective:    Patient ID: Omar Omar, male    DOB: Aug 02, 1965, 49 y.o.   MRN: [id number]  HPI He complains of an aching sensation in his left cheek area but no earache, sore throat, tooth discomfort. He thinks that this is related to stress but states that when he is under stress he notes no change. He cites stressors in his life with home and a son that has had several injuries over the last year as well as work. In the past she has had difficulty psychologically and apparently was on Lexapro as well as in counseling.   Review of Systems     Objective:   Physical Exam Alert and in no distress.EOMI. Other cranial nerves grossly intact. Tympanic membranes and canals are normal. Pharyngeal area is normal. Neck is supple without adenopathy or thyromegaly. Cardiac exam shows a regular sinus rhythm without murmurs or gallops. Lungs are clear to auscultation.        Assessment & Plan:  Anxiety state I explained that his exam was entirely negative and specifically no evidence of Bell's palsy. I stated that this could be related to stress. Strongly encouraged him to get back involved in counseling. He admits to accessing over issues he has no control over.

## 2014-09-16 ENCOUNTER — Encounter: Payer: Self-pay | Admitting: Medical

## 2014-09-16 ENCOUNTER — Ambulatory Visit (INDEPENDENT_AMBULATORY_CARE_PROVIDER_SITE_OTHER): Payer: 59 | Admitting: Medical

## 2014-09-16 VITALS — BP 112/80 | HR 65 | Resp 15 | Wt 197.0 lb

## 2014-09-16 DIAGNOSIS — F411 Generalized anxiety disorder: Secondary | ICD-10-CM | POA: Diagnosis not present

## 2014-09-16 DIAGNOSIS — I10 Essential (primary) hypertension: Secondary | ICD-10-CM

## 2014-09-16 NOTE — Progress Notes (Addendum)
Subjective: Here for recheck on anxiety.  Having trouble with anxiety.  Once he has an idea or issue he is working on, will obsess on this issue until it is resolved.  This gets in teh way of getting other tasks done.  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  In addition to normal life stress he has had increased stress with son's health care issues in the last few months. His teenage son who plays football and basketball in the last few months had a lacerated liver from football injury, ankle fracture during basketball season, and just recently had an hernia repair.  He was angry at first that these things could happen to his son but now is coming to terms with things.  He worries overall about his health, the unknown, just wants advice on this. He has been on Lunesta and Lexapro in the past for sleep and anxiety and did fine on this. Doesn't feel the need to be on medication at this time though. No current counseling.  Married, 3 children.  17yo daughter, 15yo son, 75yo daughter.  All at home.     Exercise - played college basketball.   Coaches now, avoids lots of playing basketball now being afraid of achilles tear.  Goes to [employer], swims.  Cardio - pool.   Eats healthy.  Wants to make sure his BP is ok.  Compliant with lisinopril blood pressure medication and does fine on this.  No other aggravating or relieving factors. No other complaint.   Objective: BP 112/80 mmHg  Pulse 65  Resp 15  Wt 197 lb (89.359 kg)  General appearance: alert, no distress, WD/WN, pleasant AA male Neck: supple, no lymphadenopathy, no thyromegaly, no masses Heart: RRR, normal S1, S2, no murmurs Lungs: CTA bilaterally, no wheezes, rhonchi, or rales Pulses: 2+ symmetric, upper and lower extremities, normal cap refill Ext: no edema   Assessment: Encounter Diagnoses  Name Primary?  Marland Kitchen Anxiety state Yes  . Essential hypertension       Plan: Anxiety -  PHQ-9 questionnaire with score of 7 today. General Anxiet screen with score of 11.   begin trial of Trintellix, 5mg  and 10mg  samples given to begin once daily at 5mg  daily.   Discussed his concerns, discussed stress reduction, consider counseling, have consistent routine with healthy diet, routine exercise, work on getting good sleep.   HTN - continue current medication follow-up soon for physical  Spent > 30 minutes face to face in evaluation and treatment recommendations.

## 2014-11-11 ENCOUNTER — Emergency Department (HOSPITAL_BASED_OUTPATIENT_CLINIC_OR_DEPARTMENT_OTHER)
Admission: EM | Admit: 2014-11-11 | Discharge: 2014-11-11 | Disposition: A | Discharge status: Home / Self Care | Payer: 59 | Attending: Emergency Medicine | Admitting: Emergency Medicine

## 2014-11-11 ENCOUNTER — Encounter (HOSPITAL_BASED_OUTPATIENT_CLINIC_OR_DEPARTMENT_OTHER): Payer: Self-pay | Admitting: Emergency Medicine

## 2014-11-11 DIAGNOSIS — Z79899 Other long term (current) drug therapy: Secondary | ICD-10-CM | POA: Insufficient documentation

## 2014-11-11 DIAGNOSIS — I10 Essential (primary) hypertension: Secondary | ICD-10-CM | POA: Diagnosis not present

## 2014-11-11 DIAGNOSIS — R519 Headache, unspecified: Secondary | ICD-10-CM

## 2014-11-11 DIAGNOSIS — Z8659 Personal history of other mental and behavioral disorders: Secondary | ICD-10-CM | POA: Insufficient documentation

## 2014-11-11 DIAGNOSIS — K219 Gastro-esophageal reflux disease without esophagitis: Secondary | ICD-10-CM | POA: Insufficient documentation

## 2014-11-11 DIAGNOSIS — R51 Headache: Secondary | ICD-10-CM | POA: Diagnosis not present

## 2014-11-11 MED ORDER — DEXAMETHASONE 4 MG PO TABS
ORAL_TABLET | ORAL | Status: AC
  Administered 2014-11-11: 10 mg via ORAL
  Filled 2014-11-11: qty 3

## 2014-11-11 MED ORDER — DEXAMETHASONE 4 MG PO TABS
10.0000 mg | ORAL_TABLET | Freq: Once | ORAL | Status: AC
  Administered 2014-11-11: 10 mg via ORAL

## 2014-11-11 MED ORDER — PROMETHAZINE HCL 25 MG/ML IJ SOLN
25.0000 mg | Freq: Once | INTRAMUSCULAR | Status: AC
  Administered 2014-11-11: 25 mg via INTRAVENOUS
  Filled 2014-11-11: qty 1

## 2014-11-11 MED ORDER — KETOROLAC TROMETHAMINE 60 MG/2ML IM SOLN
60.0000 mg | Freq: Once | INTRAMUSCULAR | Status: AC
  Administered 2014-11-11: 60 mg via INTRAMUSCULAR
  Filled 2014-11-11: qty 2

## 2014-11-11 NOTE — ED Notes (Signed)
Patient state that he has had a HA to different sections of his head for "days" but tonight it is not tolerable.

## 2014-11-11 NOTE — Discharge Instructions (Signed)

## 2014-11-11 NOTE — ED Provider Notes (Signed)
CSN: [id number]     Arrival date & time 11/11/14  0043 History   First MD Initiated Contact with Patient 11/11/14 [phone]     Chief Complaint  Patient presents with  . Headache      Patient is a 49 y.o. male presenting with headaches. The history is provided by the patient. No language interpreter was used.  Headache  Omar Phillips presents for evaluation of headache.  He reports 2 weeks of waxing and waning headache. The headache is located over the left forehead and vertex scalp. The headache moves around from location to location. Headaches and the be better in the morning and worse as the day progresses. The symptoms resolved with ibuprofen. He tried ibuprofen earlier today without relief. Today is the worst day of his headache. He denies any head injury, fevers, vomiting, vision changes, numbness, weakness. He has a history of anxiety but no other medical problems. Symptoms are moderate, constant, worsening.  Past Medical History  Diagnosis Date  . Hypertension   . Anxiety   . GERD (gastroesophageal reflux disease)    History reviewed. No pertinent past surgical history. Family History  Problem Relation Age of Onset  . Hypertension Mother   . Hypertension Sister    History  Substance Use Topics  . Smoking status: Never Smoker   . Smokeless tobacco: Never Used  . Alcohol Use: No    Review of Systems  Neurological: Positive for headaches.  All other systems reviewed and are negative.     Allergies  Review of patient's allergies indicates no known allergies.  Home Medications   Prior to Admission medications   Medication Sig Start Date End Date Taking? Authorizing Provider  b complex vitamins capsule Take 1 capsule by mouth daily.    Historical Provider, MD  esomeprazole (NEXIUM) 40 MG capsule Take 1 capsule (40 mg total) by mouth daily. Patient not taking: Reported on 07/10/2014 01/30/14   Roselee Culver, MD  lisinopril (PRINIVIL,ZESTRIL) 10 MG tablet Take 1 tablet  (10 mg total) by mouth daily. 07/10/14   Camelia Eng Tysinger, PA-C  Omega-3 Fatty Acids (FISH OIL PO) Take by mouth.    Historical Provider, MD  vitamin B-12 (CYANOCOBALAMIN) 1000 MCG tablet Take 1,000 mcg by mouth daily.    Historical Provider, MD   BP 132/94 mmHg  Pulse 60  Temp(Src) 98 F (36.7 C) (Oral)  Resp 20  Ht 6' (1.829 m)  Wt 198 lb (89.812 kg)  BMI 26.85 kg/m2  SpO2 100% Physical Exam  Constitutional: He is oriented to person, place, and time. He appears well-developed and well-nourished.  HENT:  Head: Normocephalic and atraumatic.  Right Ear: External ear normal.  Left Ear: External ear normal.  Mouth/Throat: Oropharynx is clear and moist.  Eyes: Pupils are equal, round, and reactive to light.  Cardiovascular: Normal rate and regular rhythm.   No murmur heard. Pulmonary/Chest: Effort normal and breath sounds normal. No respiratory distress.  Abdominal: Soft. There is no tenderness. There is no rebound and no guarding.  Musculoskeletal: He exhibits no edema or tenderness.  Neurological: He is alert and oriented to person, place, and time. No cranial nerve deficit. Coordination normal.  Skin: Skin is warm and dry.  Psychiatric: He has a normal mood and affect. His behavior is normal.  Nursing note and vitals reviewed.   ED Course  Procedures (including critical care time) Labs Review Labs Reviewed - No data to display  Imaging Review No results found.   EKG Interpretation None  MDM   Final diagnoses:  Bad headache    Patient here for evaluation of headache. History and presentation is not consistent with subarachnoid hemorrhage, meningitis, dural sinus thrombosis, sinusitis, temporal arteritis. Treating headache with headache cocktail. Discussed home care and return precautions for headache.  Quintella Reichert, MD 11/11/14 [phone]

## 2014-11-15 ENCOUNTER — Emergency Department (HOSPITAL_COMMUNITY)
Admission: EM | Admit: 2014-11-15 | Discharge: 2014-11-16 | Disposition: A | Discharge status: Home / Self Care | Payer: 59 | Attending: Emergency Medicine | Admitting: Emergency Medicine

## 2014-11-15 ENCOUNTER — Encounter (HOSPITAL_COMMUNITY): Payer: Self-pay

## 2014-11-15 DIAGNOSIS — K219 Gastro-esophageal reflux disease without esophagitis: Secondary | ICD-10-CM | POA: Insufficient documentation

## 2014-11-15 DIAGNOSIS — I159 Secondary hypertension, unspecified: Secondary | ICD-10-CM | POA: Diagnosis not present

## 2014-11-15 DIAGNOSIS — R51 Headache: Secondary | ICD-10-CM | POA: Diagnosis not present

## 2014-11-15 DIAGNOSIS — Z7901 Long term (current) use of anticoagulants: Secondary | ICD-10-CM | POA: Insufficient documentation

## 2014-11-15 DIAGNOSIS — I10 Essential (primary) hypertension: Secondary | ICD-10-CM | POA: Diagnosis present

## 2014-11-15 DIAGNOSIS — R519 Headache, unspecified: Secondary | ICD-10-CM

## 2014-11-15 DIAGNOSIS — Z79899 Other long term (current) drug therapy: Secondary | ICD-10-CM | POA: Diagnosis not present

## 2014-11-15 DIAGNOSIS — Z8659 Personal history of other mental and behavioral disorders: Secondary | ICD-10-CM | POA: Insufficient documentation

## 2014-11-15 MED ORDER — KETOROLAC TROMETHAMINE 60 MG/2ML IM SOLN
60.0000 mg | Freq: Once | INTRAMUSCULAR | Status: AC
  Administered 2014-11-16: 60 mg via INTRAMUSCULAR
  Filled 2014-11-15: qty 2

## 2014-11-15 MED ORDER — DEXAMETHASONE SODIUM PHOSPHATE 10 MG/ML IJ SOLN
10.0000 mg | Freq: Once | INTRAMUSCULAR | Status: DC
  Filled 2014-11-15: qty 1

## 2014-11-15 MED ORDER — ONDANSETRON 4 MG PO TBDP
4.0000 mg | ORAL_TABLET | Freq: Once | ORAL | Status: AC
  Administered 2014-11-15: 4 mg via ORAL
  Filled 2014-11-15: qty 1

## 2014-11-15 MED ORDER — PROMETHAZINE HCL 25 MG PO TABS
25.0000 mg | ORAL_TABLET | Freq: Once | ORAL | Status: AC
  Administered 2014-11-16: 25 mg via ORAL
  Filled 2014-11-15: qty 1

## 2014-11-15 MED ORDER — MORPHINE SULFATE 4 MG/ML IJ SOLN
4.0000 mg | Freq: Once | INTRAMUSCULAR | Status: AC
  Administered 2014-11-15: 4 mg via INTRAMUSCULAR
  Filled 2014-11-15: qty 1

## 2014-11-15 NOTE — ED Provider Notes (Signed)
CSN: [id number]     Arrival date & time 11/15/14  2115 History   First MD Initiated Contact with Patient 11/15/14 2137     Chief Complaint  Patient presents with  . Hypertension     (Consider location/radiation/quality/duration/timing/severity/associated sxs/prior Treatment) HPI    PCP: Crisoforo Oxford, PA-C Blood pressure 139/93, pulse 60, temperature 98 F (36.7 C), temperature source Oral, resp. rate 16, height 5\' 11"  (1.803 m), weight 197 lb (89.359 kg), SpO2 99 %.  Omar Phillips is a 49 y.o.male with a significant PMH of hypertension and anxiety presents to the ER with complaints of elevated blood pressure. He reports having a BP of 160/100 today. He tries to frequently check his blood pressure and reports never seeing it that elevated before.   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 He has had some mild pressure on the top of his head and his friend who is a nurse told him that because of his blood pressure and headache he needed to go to the hospital. He has not had any  diaphoresis, fever,  weakness (general or focal), confusion, change of vision,  neck pain, dysphagia, aphagia, chest pain, shortness of breath,  back pain, abdominal pains, nausea, vomiting, diarrhea, lower extremity swelling, rash. He admits to starting a new medication recently and feels the two may be correlated.    Past Medical History  Diagnosis Date  . Hypertension   . Anxiety   . GERD (gastroesophageal reflux disease)    History reviewed. No pertinent past surgical history. Family History  Problem Relation Age of Onset  . Hypertension Mother   . Hypertension Sister    Social History  Substance Use Topics  . Smoking status: Never Smoker   . Smokeless tobacco: Never Used  . Alcohol Use: No    Review of Systems  10 Systems reviewed and are negative for  acute change except as noted in the HPI.    Allergies  Review of patient's allergies indicates no known allergies.  Home Medications   Prior to Admission medications   Medication Sig Start Date End Date Taking? Authorizing Provider  lisinopril (PRINIVIL,ZESTRIL) 10 MG tablet Take 1 tablet (10 mg total) by mouth daily. 07/10/14  Yes Camelia Eng Tysinger, PA-C  Omega-3 Fatty Acids (FISH OIL PO) Take by mouth.   Yes Historical Provider, MD  Vortioxetine HBr (BRINTELLIX) 10 MG TABS Take 10 mg by mouth daily.   Yes Historical Provider, MD  b complex vitamins capsule Take 1 capsule by mouth daily.    Historical Provider, MD  esomeprazole (NEXIUM) 40 MG capsule Take 1 capsule (40 mg total) by mouth daily. Patient not taking: Reported on 07/10/2014 01/30/14   Roselee Culver, MD  vitamin B-12 (CYANOCOBALAMIN) 1000 MCG tablet Take 1,000 mcg by mouth daily.    Historical Provider, MD   BP 139/93 mmHg  Pulse 60  Temp(Src) 98 F (36.7 C) (Oral)  Resp 16  Ht 5\' 11"  (1.803 m)  Wt 197 lb (89.359 kg)  BMI 27.49 kg/m2  SpO2 99% Physical Exam  Constitutional: He is oriented to person, place, and time. He appears well-developed and well-nourished. No distress.  HENT:  Head: Normocephalic and atraumatic.  Eyes: Pupils are equal, round, and reactive to light.  Neck: Normal range of motion. Neck supple.  Cardiovascular: Normal rate and regular rhythm.  Pulmonary/Chest: Effort normal.  Abdominal: Soft.  Neurological: He is alert and oriented to person, place, and time.  Cranial nerves II-VIII and X-XII evaluated and show no deficits. Pt alert and oriented x 3 Upper and lower extremity strength is symmetrical and physiologic Normal muscular tone No facial droop Coordination intact, no limb ataxia Rapid alternating movements normal No pronator drift  Skin: Skin is warm and dry.  Nursing note and vitals reviewed.   ED Course  Procedures (including critical care time) Labs Review Labs Reviewed -  No data to display  Imaging Review No results found.   EKG Interpretation None      MDM   Final diagnoses:  Secondary hypertension, unspecified  Nonintractable headache, unspecified chronicity pattern, unspecified headache type    Medications  morphine 4 MG/ML injection 4 mg (4 mg Intramuscular Given 11/15/14 2257)  ondansetron (ZOFRAN-ODT) disintegrating tablet 4 mg (4 mg Oral Given 11/15/14 2257)  promethazine (PHENERGAN) tablet 25 mg (25 mg Oral Given 11/16/14 0020)  ketorolac (TORADOL) injection 60 mg (60 mg Intramuscular Given 11/16/14 0020)  dexamethasone (DECADRON) injection 10 mg (10 mg Intramuscular Given 11/16/14 0019)   Patient given medication for headache and pain improved significantly. BP also improved to 139/93. Patient having symptoms of only headache. His blood pressure is typically 130's/90's and I do not feel that the hypertension is the cause of his headache. He has been instructed to follow-up with his PCP.  Presentation is non concerning for [facility], ICH, Meningitis, or temporal arteritis. Pt is afebrile with no focal neuro deficits, nuchal rigidity, or change in vision. The patient denies any symptoms of neurological impairment or TIA's; no amaurosis, diplopia, dysphasia, or unilateral disturbance of motor or sensory function. No loss of balance or vertigo   49 y.o.Xzander Lynn Bagent's evaluation in the Emergency Department is complete. It has been determined that no acute conditions requiring further emergency intervention are present at this time. The patient/guardian have been advised of the diagnosis and plan. We have discussed signs and symptoms that warrant return to the ED, such as changes or worsening in symptoms.  Vital signs are stable at discharge. Filed Vitals:   11/16/14 0016  BP: 139/93  Pulse: 60  Temp:   Resp: 16    Patient/guardian has voiced understanding and agreed to follow-up with the PCP or specialist.    Delos Haring, PA-C 11/21/14  1536  Tanna Furry, MD 12/30/14 [phone]

## 2014-11-15 NOTE — Discharge Instructions (Signed)

## 2014-11-15 NOTE — ED Notes (Signed)
Pt states his blood pressure 160/100 today. C/O of pressure in top of head. Pt states "started new medicine a week ago for anxiety and suspects it might be related to that"

## 2014-11-16 MED ORDER — DEXAMETHASONE SODIUM PHOSPHATE 10 MG/ML IJ SOLN
10.0000 mg | Freq: Once | INTRAMUSCULAR | Status: AC
  Administered 2014-11-16: 10 mg via INTRAMUSCULAR

## 2014-11-16 NOTE — ED Notes (Signed)
Patient was not in the room when the nurse went to discharge patient.

## 2014-12-10 ENCOUNTER — Encounter: Payer: Self-pay | Admitting: Medical

## 2014-12-10 ENCOUNTER — Ambulatory Visit (INDEPENDENT_AMBULATORY_CARE_PROVIDER_SITE_OTHER): Payer: 59 | Admitting: Medical

## 2014-12-10 VITALS — BP 136/92 | HR 82 | Temp 97.9°F | Resp 18 | Wt 197.0 lb

## 2014-12-10 DIAGNOSIS — T50905A Adverse effect of unspecified drugs, medicaments and biological substances, initial encounter: Secondary | ICD-10-CM

## 2014-12-10 DIAGNOSIS — G4489 Other headache syndrome: Secondary | ICD-10-CM

## 2014-12-10 DIAGNOSIS — I10 Essential (primary) hypertension: Secondary | ICD-10-CM | POA: Diagnosis not present

## 2014-12-10 LAB — COMPREHENSIVE METABOLIC PANEL
ALT: 27 U/L (ref 9–46)
AST: 25 U/L (ref 10–40)
Albumin: 4.6 g/dL (ref 3.6–5.1)
Alkaline Phosphatase: 62 U/L (ref 40–115)
BUN: 12 mg/dL (ref 7–25)
CO2: 27 mmol/L (ref 20–31)
Calcium: 9.5 mg/dL (ref 8.6–10.3)
Chloride: 99 mmol/L (ref 98–110)
Creat: 1.11 mg/dL (ref 0.60–1.35)
Glucose, Bld: 99 mg/dL (ref 65–99)
Potassium: 4.3 mmol/L (ref 3.5–5.3)
Sodium: 133 mmol/L — ABNORMAL LOW (ref 135–146)
Total Bilirubin: 0.5 mg/dL (ref 0.2–1.2)
Total Protein: 7.5 g/dL (ref 6.1–8.1)

## 2014-12-10 LAB — CBC
HCT: 43.3 % (ref 39.0–52.0)
Hemoglobin: 14.8 g/dL (ref 13.0–17.0)
MCH: 30.5 pg (ref 26.0–34.0)
MCHC: 34.2 g/dL (ref 30.0–36.0)
MCV: 89.1 fL (ref 78.0–100.0)
MPV: 8.9 fL (ref 8.6–12.4)
Platelets: 333 10*3/uL (ref 150–400)
RBC: 4.86 MIL/uL (ref 4.22–5.81)
RDW: 13.2 % (ref 11.5–15.5)
WBC: 4.1 10*3/uL (ref 4.0–10.5)

## 2014-12-10 LAB — LIPID PANEL
Cholesterol: 272 mg/dL — ABNORMAL HIGH (ref 125–200)
HDL: 43 mg/dL (ref >=40)
LDL Cholesterol: 187 mg/dL — ABNORMAL HIGH (ref <130)
Total CHOL/HDL Ratio: 6.3 Ratio — ABNORMAL HIGH (ref <=5.0)
Triglycerides: 212 mg/dL — ABNORMAL HIGH (ref <150)
VLDL: 42 mg/dL — ABNORMAL HIGH (ref <30)

## 2014-12-10 LAB — TSH: TSH: 0.414 u[IU]/mL (ref 0.350–4.500)

## 2014-12-10 NOTE — Progress Notes (Signed)
   Subjective: Here for headaches.  Since last visit he had 2 recent ED visits for headache.   No specific trigger.  He had not been having headache issues, but these were bad headaches.   He was treated and advised to f/u here.  After the first ED visit he eventually started the Brintellix from last visit.   Since taking this he has felt improvement in anxiety, but has noticed problems with sex drive and nausea, and is having some headaches now.   Denies any specific trigger.  Denies allergy or sinus problems, no recent change in caffeine, not skipping meals, no sleep issues.  No hx/o headache disorder. No other aggravating or relieving factors. No other complaint.  No family hx/o aneurysm, brain tumor, headaches.  Past Medical History  Diagnosis Date  . Hypertension   . Anxiety   . GERD (gastroesophageal reflux disease)    ROS as in subjective   Objective: BP 136/92 mmHg  Pulse 82  Temp(Src) 97.9 F (36.6 C) (Oral)  Resp 18  Wt 197 lb (89.359 kg)  General appearance: alert, no distress, WD/WN,  HEENT: normocephalic, sclerae anicteric, PERRLA, EOMi, nares patent, no discharge or erythema, pharynx normal Oral cavity: MMM, no lesions Neck: supple, no lymphadenopathy, no thyromegaly, no masses, no bruits Heart: RRR, normal S1, S2, no murmurs Lungs: CTA bilaterally, no wheezes, rhonchi, or rales Extremities: no edema, no cyanosis, no clubbing Pulses: 2+ symmetric, upper and lower extremities, normal cap refill Neurological: alert, oriented x 3, CN2-12 intact, strength normal upper extremities and lower extremities, sensation normal throughout, DTRs 2+ throughout, no cerebellar signs, gait normal Psychiatric: normal affect, behavior normal, pleasant    Assessment: Encounter Diagnoses  Name Primary?  . Headache syndrome Yes  . Essential hypertension   . Adverse effects of medication, initial encounter     Plan: Reviewed recent ED visits . Etiology unclear, and the 2 bad  headaches were prior to him starting Brintellix.   Advised Excedrin or ibuprofen OTC for acute headache.   Since he has now started getting some headache along with other side effects since beginning Brintellix we will stop this.  Gave instructions on stopping this medication over the next week.  He will keep a symptom diary.  C/t lisinopril for now.  Discussed possibly changing to beta blocker for both HTN and anxiety.  He will call back in 2 weeks to give me update on symptoms.  Labs today.  F/u pending labs and call back

## 2014-12-11 ENCOUNTER — Other Ambulatory Visit: Payer: Self-pay | Admitting: Medical

## 2014-12-11 ENCOUNTER — Telehealth: Payer: Self-pay

## 2014-12-11 MED ORDER — ATORVASTATIN CALCIUM 20 MG PO TABS: 20.0000 mg | ORAL_TABLET | Freq: Every day | ORAL | Status: DC

## 2014-12-11 MED ORDER — METOPROLOL SUCCINATE ER 25 MG PO TB24: 25.0000 mg | ORAL_TABLET | Freq: Every day | ORAL | Status: DC

## 2014-12-11 NOTE — Telephone Encounter (Signed)
Left detailed message on voice mail (He identified himself)

## 2015-02-11 ENCOUNTER — Emergency Department (HOSPITAL_BASED_OUTPATIENT_CLINIC_OR_DEPARTMENT_OTHER)
Admission: EM | Admit: 2015-02-11 | Discharge: 2015-02-11 | Disposition: A | Discharge status: Home / Self Care | Payer: 59 | Attending: Emergency Medicine | Admitting: Emergency Medicine

## 2015-02-11 ENCOUNTER — Encounter (HOSPITAL_BASED_OUTPATIENT_CLINIC_OR_DEPARTMENT_OTHER): Payer: Self-pay | Admitting: *Deleted

## 2015-02-11 DIAGNOSIS — M545 Low back pain, unspecified: Secondary | ICD-10-CM

## 2015-02-11 DIAGNOSIS — Z7982 Long term (current) use of aspirin: Secondary | ICD-10-CM | POA: Insufficient documentation

## 2015-02-11 DIAGNOSIS — Z8659 Personal history of other mental and behavioral disorders: Secondary | ICD-10-CM | POA: Diagnosis not present

## 2015-02-11 DIAGNOSIS — G8929 Other chronic pain: Secondary | ICD-10-CM | POA: Diagnosis not present

## 2015-02-11 DIAGNOSIS — I10 Essential (primary) hypertension: Secondary | ICD-10-CM | POA: Diagnosis not present

## 2015-02-11 DIAGNOSIS — K219 Gastro-esophageal reflux disease without esophagitis: Secondary | ICD-10-CM | POA: Diagnosis not present

## 2015-02-11 LAB — URINALYSIS, ROUTINE W REFLEX MICROSCOPIC
Bilirubin Urine: NEGATIVE
Glucose, UA: NEGATIVE mg/dL
Hgb urine dipstick: NEGATIVE
Ketones, ur: NEGATIVE mg/dL
Leukocytes, UA: NEGATIVE
Nitrite: NEGATIVE
Protein, ur: NEGATIVE mg/dL
Specific Gravity, Urine: 1.037 — ABNORMAL HIGH (ref 1.005–1.030)
Urobilinogen, UA: 1 mg/dL (ref 0.0–1.0)
pH: 5.5 (ref 5.0–8.0)

## 2015-02-11 MED ORDER — KETOROLAC TROMETHAMINE 30 MG/ML IJ SOLN
30.0000 mg | Freq: Once | INTRAMUSCULAR | Status: AC
  Administered 2015-02-11: 30 mg via INTRAMUSCULAR

## 2015-02-11 MED ORDER — CYCLOBENZAPRINE HCL 10 MG PO TABS: 10.0000 mg | ORAL_TABLET | Freq: Two times a day (BID) | ORAL | Status: DC | PRN

## 2015-02-11 MED ORDER — CYCLOBENZAPRINE HCL 10 MG PO TABS
5.0000 mg | ORAL_TABLET | Freq: Once | ORAL | Status: AC
  Administered 2015-02-11: 5 mg via ORAL
  Filled 2015-02-11: qty 1

## 2015-02-11 MED ORDER — KETOROLAC TROMETHAMINE 30 MG/ML IJ SOLN
30.0000 mg | Freq: Once | INTRAMUSCULAR | Status: DC
  Filled 2015-02-11: qty 1

## 2015-02-11 NOTE — Discharge Instructions (Signed)
Back Pain, Adult °Back pain is very common in adults. The cause of back pain is rarely dangerous and the pain often gets better over time. The cause of your back pain may not be known. Some common causes of back pain include: °· Strain of the muscles or ligaments supporting the spine. °· Wear and tear (degeneration) of the spinal disks. °· Arthritis. °· Direct injury to the back. °For many people, back pain may return. Since back pain is rarely dangerous, most people can learn to manage this condition on their own. °HOME CARE INSTRUCTIONS °Watch your back pain for any changes. The following actions may help to lessen any discomfort you are feeling: °· Remain active. It is stressful on your back to sit or stand in one place for long periods of time. Do not sit, drive, or stand in one place for more than 30 minutes at a time. Take short walks on even surfaces as soon as you are able. Try to increase the length of time you walk each day. °· Exercise regularly as directed by your health care provider. Exercise helps your back heal faster. It also helps avoid future injury by keeping your muscles strong and flexible. °· Do not stay in bed. Resting more than 1-2 days can delay your recovery. °· Pay attention to your body when you bend and lift. The most comfortable positions are those that put less stress on your recovering back. Always use proper lifting techniques, including: °¨ Bending your knees. °¨ Keeping the load close to your body. °¨ Avoiding twisting. °· Find a comfortable position to sleep. Use a firm mattress and lie on your side with your knees slightly bent. If you lie on your back, put a pillow under your knees. °· Avoid feeling anxious or stressed. Stress increases muscle tension and can worsen back pain. It is important to recognize when you are anxious or stressed and learn ways to manage it, such as with exercise. °· Take medicines only as directed by your health care provider. Over-the-counter  medicines to reduce pain and inflammation are often the most helpful. Your health care provider may prescribe muscle relaxant drugs. These medicines help dull your pain so you can more quickly return to your normal activities and healthy exercise. °· Apply ice to the injured area: °¨ Put ice in a plastic bag. °¨ Place a towel between your skin and the bag. °¨ Leave the ice on for 20 minutes, 2-3 times a day for the first 2-3 days. After that, ice and heat may be alternated to reduce pain and spasms. °· Maintain a healthy weight. Excess weight puts extra stress on your back and makes it difficult to maintain good posture. °SEEK MEDICAL CARE IF: °· You have pain that is not relieved with rest or medicine. °· You have increasing pain going down into the legs or buttocks. °· You have pain that does not improve in one week. °· You have night pain. °· You lose weight. °· You have a fever or chills. °SEEK IMMEDIATE MEDICAL CARE IF:  °· You develop new bowel or bladder control problems. °· You have unusual weakness or numbness in your arms or legs. °· You develop nausea or vomiting. °· You develop abdominal pain. °· You feel faint. °  °This information is not intended to replace advice given to you by your health care provider. Make sure you discuss any questions you have with your health care provider. °  °Document Released: 03/27/2005 Document Revised: 04/17/2014 Document Reviewed: 07/29/2013 °Elsevier Interactive Patient Education ©2016 [employer]   [employer]  Please read attached information. If you experience any new or worsening signs or symptoms please return to the emergency room for evaluation. Please follow-up with your primary care provider or specialist as discussed. Please use medication prescribed only as directed and discontinue taking if you have any concerning signs or symptoms.

## 2015-02-11 NOTE — ED Notes (Signed)
Back pain. States he called his orthopedist and can't be seen today. They can see him tomorrow for a steroid injection.

## 2015-02-11 NOTE — ED Provider Notes (Signed)
CSN: [id number]     Arrival date & time 02/11/15  1509 History   First MD Initiated Contact with Patient 02/11/15 1707     Chief Complaint  Patient presents with  . Back Pain   HPI   49 year old male presents today with back pain. Patient has a history of chronic back pain for which she sees orthopedic specialist. He reports that he has episodic flares that require joint injections. Orthopedist was consult and who only does joint injections on Tuesdays and Thursdays. Patient reports that the pain started today in his lower lumbar soft tissue, difficulty with getting in and out of bed, worse with forced flexion. Patient reports this is typical of his lumbar back pain. He denies any loss of distal sensation strength or motor function, denies loss of bowel or bladder function, fever, or any other red flags. Patient attempted using Tylenol at home with no relief of symptoms.  Past Medical History  Diagnosis Date  . Hypertension   . Anxiety   . GERD (gastroesophageal reflux disease)    History reviewed. No pertinent past surgical history. Family History  Problem Relation Age of Onset  . Hypertension Mother   . Hypertension Sister   . Heart disease Neg Hx   . Cancer Neg Hx    Social History  Substance Use Topics  . Smoking status: Never Smoker   . Smokeless tobacco: Never Used  . Alcohol Use: No    Review of Systems  All other systems reviewed and are negative.   Allergies  Review of patient's allergies indicates no known allergies.  Home Medications   Prior to Admission medications   Medication Sig Start Date End Date Taking? Authorizing Provider  aspirin EC 81 MG tablet Take 81 mg by mouth daily.    Historical Provider, MD  atorvastatin (LIPITOR) 20 MG tablet Take 1 tablet (20 mg total) by mouth daily. 12/11/14   Camelia Eng Tysinger, PA-C  b complex vitamins capsule Take 1 capsule by mouth daily.    Historical Provider, MD  cyclobenzaprine (FLEXERIL) 10 MG tablet Take 1 tablet (10  mg total) by mouth 2 (two) times daily as needed for muscle spasms. 02/11/15   Okey Regal, PA-C  esomeprazole (NEXIUM) 40 MG capsule Take 1 capsule (40 mg total) by mouth daily. Patient not taking: Reported on 07/10/2014 01/30/14   Roselee Culver, MD  metoprolol succinate (TOPROL XL) 25 MG 24 hr tablet Take 1 tablet (25 mg total) by mouth daily. 12/11/14   Camelia Eng Tysinger, PA-C  Omega-3 Fatty Acids (FISH OIL PO) Take by mouth.    Historical Provider, MD  vitamin B-12 (CYANOCOBALAMIN) 1000 MCG tablet Take 1,000 mcg by mouth daily.    Historical Provider, MD   BP 132/94 mmHg  Pulse 62  Temp(Src) 97.8 F (36.6 C) (Oral)  Resp 18  Ht 6' (1.829 m)  Wt 197 lb (89.359 kg)  BMI 26.71 kg/m2  SpO2 100%   Physical Exam  Constitutional: He is oriented to person, place, and time. He appears well-developed and well-nourished. No distress.  HENT:  Head: Normocephalic.  Neck: Normal range of motion. Neck supple.  Pulmonary/Chest: Effort normal.  Musculoskeletal: Normal range of motion. He exhibits tenderness. He exhibits no edema.  No C, T, or L spine tenderness to palpation. No obvious signs of trauma, deformity, infection, step-offs. Lung expansion normal. No scoliosis or kyphosis. Bilateral lower extremity strength 5 out of 5, sensation grossly intact, patellar reflexes 2+, pedal pulses 2+, Refill less than  3 seconds.  Minor tenderness to palpation of lumbar soft tissues  Straight leg negative Ambulates with some difficulty   Neurological: He is alert and oriented to person, place, and time.  Skin: Skin is warm and dry. He is not diaphoretic.  Psychiatric: He has a normal mood and affect. His behavior is normal. Judgment and thought content normal.  Nursing note and vitals reviewed.     ED Course  Procedures (including critical care time) Labs Review Labs Reviewed  URINALYSIS, ROUTINE W REFLEX MICROSCOPIC (NOT AT [facility]) - Abnormal; Notable for the following:    Specific Gravity,  Urine 1.037 (*)    All other components within normal limits    Imaging Review No results found. I have personally reviewed and evaluated these images and lab results as part of my medical decision-making.   EKG Interpretation None      MDM   Final diagnoses:  Bilateral low back pain without sciatica    Labs: Urinalysis  Imaging:  Consults:  Therapeutics: Toradol, Flexeril  Discharge Meds:   Assessment/Plan: Patient presents with bilateral lower back pain, no neurological deficits, no red flags. Patient treated here in the ED with good symptomatic improvement with Toradol and Flexeril. He has an appointment with his orthopedic surgeon for joint injections. Patient discharged home with instructions to use ibuprofen or Tylenol as needed for pain, return to emergency room if new or worsening signs or symptoms present. Patient ambulated out of the ED without any difficulty.         Okey Regal, PA-C 02/11/15 2206  Veryl Speak, MD 02/12/15 1249

## 2015-02-12 ENCOUNTER — Ambulatory Visit: Payer: 59 | Admitting: Family Medicine

## 2015-06-20 ENCOUNTER — Other Ambulatory Visit: Payer: Self-pay | Admitting: Medical

## 2015-09-25 ENCOUNTER — Other Ambulatory Visit: Payer: Self-pay | Admitting: Medical

## 2015-09-27 ENCOUNTER — Other Ambulatory Visit: Payer: Self-pay | Admitting: Medical

## 2015-09-27 NOTE — Telephone Encounter (Signed)
Pt needs appt tried to call and MB full

## 2015-09-28 ENCOUNTER — Telehealth: Payer: Self-pay | Admitting: Medical

## 2015-09-28 MED ORDER — METOPROLOL SUCCINATE ER 25 MG PO TB24: 25.0000 mg | ORAL_TABLET | Freq: Every day | ORAL | Status: DC

## 2015-09-28 NOTE — Telephone Encounter (Signed)
done

## 2015-09-28 NOTE — Telephone Encounter (Signed)
Pt has CPE appt on 10/20/15. Requesting refill on Metoprolol to last until that appt

## 2015-10-20 ENCOUNTER — Ambulatory Visit (INDEPENDENT_AMBULATORY_CARE_PROVIDER_SITE_OTHER): Payer: 59 | Admitting: Medical

## 2015-10-20 ENCOUNTER — Encounter: Payer: Self-pay | Admitting: Medical

## 2015-10-20 VITALS — BP 140/98 | HR 66 | Ht 72.0 in | Wt 203.0 lb

## 2015-10-20 DIAGNOSIS — Z0000 Encounter for general adult medical examination without abnormal findings: Secondary | ICD-10-CM

## 2015-10-20 DIAGNOSIS — F411 Generalized anxiety disorder: Secondary | ICD-10-CM | POA: Diagnosis not present

## 2015-10-20 DIAGNOSIS — Z9119 Patient's noncompliance with other medical treatment and regimen: Secondary | ICD-10-CM

## 2015-10-20 DIAGNOSIS — I10 Essential (primary) hypertension: Secondary | ICD-10-CM

## 2015-10-20 DIAGNOSIS — R21 Rash and other nonspecific skin eruption: Secondary | ICD-10-CM | POA: Diagnosis not present

## 2015-10-20 DIAGNOSIS — Z125 Encounter for screening for malignant neoplasm of prostate: Secondary | ICD-10-CM

## 2015-10-20 DIAGNOSIS — E785 Hyperlipidemia, unspecified: Secondary | ICD-10-CM | POA: Diagnosis not present

## 2015-10-20 DIAGNOSIS — Z91199 Patient's noncompliance with other medical treatment and regimen due to unspecified reason: Secondary | ICD-10-CM | POA: Insufficient documentation

## 2015-10-20 DIAGNOSIS — K219 Gastro-esophageal reflux disease without esophagitis: Secondary | ICD-10-CM | POA: Diagnosis not present

## 2015-10-20 MED ORDER — TRIAMCINOLONE ACETONIDE 0.1 % EX OINT: 1.0000 "application " | TOPICAL_OINTMENT | Freq: Two times a day (BID) | CUTANEOUS | Status: DC

## 2015-10-20 MED ORDER — PRAVASTATIN SODIUM 20 MG PO TABS: 20.0000 mg | ORAL_TABLET | Freq: Every day | ORAL | Status: DC

## 2015-10-20 MED ORDER — ASPIRIN EC 81 MG PO TBEC: 81.0000 mg | DELAYED_RELEASE_TABLET | Freq: Every day | ORAL | Status: DC

## 2015-10-20 MED ORDER — AMLODIPINE BESYLATE 5 MG PO TABS: 5.0000 mg | ORAL_TABLET | Freq: Every day | ORAL | Status: DC

## 2015-10-20 MED ORDER — METOPROLOL SUCCINATE ER 25 MG PO TB24: 25.0000 mg | ORAL_TABLET | Freq: Every day | ORAL | Status: DC

## 2015-10-20 NOTE — Patient Instructions (Signed)
Recommendations:  See your eye doctor yearly for routine vision care.  See your dentist yearly for routine dental care including hygiene visits twice yearly.  We will call with lab results  You will be due for colonoscopy at age 50   Get a flu shot in the fall, usually September  You will be due for Tetanus update next year on your physical  Exercise daily  Eat healthy foods, be the example for your family, limit salt, avoid lots of red meat, fast food, fired foods, sweets  Given your abnormal EKG, I recommend an updated cardiac evaluation  Continue Toprol, but I recommend we add Amlodipine for better blood pressure control  I recommend you continue Aspirin 81mg  daily at bedtime  I recommend we add Pravachol at bedtime to lower cholesterol and lower heart disease risk

## 2015-10-20 NOTE — Addendum Note (Signed)
Addended by: Carlena Hurl on: 10/20/2015 11:07 AM   Modules accepted: Orders

## 2015-10-20 NOTE — Progress Notes (Signed)
Subjective:   HPI  Sarkis Musashi [place] is a 50 y.o. male who presents for a complete physical.  Medical care team includes: Crisoforo Oxford, PA-C here for primary care   Concerns: None in particular.  BPs range 127-150 SBP, 80-90 DBP.   Diet is up and down, not exercising regularly.  Takes care of everybody else as priority before himself.   Reviewed their medical, surgical, family, social, medication, and allergy history and updated chart as appropriate.  Past Medical History  Diagnosis Date  . Hypertension   . Anxiety   . GERD (gastroesophageal reflux disease)   . Chest pain 2006    hospital eval  . History of cardiovascular stress test [facility]    Past Surgical History  Procedure Laterality Date  . Testicle torsion reduction      teenage years    Social History   Social History  . Marital Status: Married    Spouse Name: N/A  . Number of Children: N/A  . Years of Education: N/A   Occupational History  . Not on file.   Social History Main Topics  . Smoking status: Never Smoker   . Smokeless tobacco: Never Used  . Alcohol Use: No  . Drug Use: No  . Sexual Activity: Not on file   Other Topics Concern  . 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   Exercise - nothing.  Diet - hit or miss, does eat some fried foods, yo yos between healthy/unhealthy diet.  As of 10/2015.       Family History  Problem Relation Age of Onset  . Hypertension Mother   . Hypothyroidism Mother   . Diabetes Mother   . Hypertension Sister   . Heart disease Neg Hx   . Other Father     history unknown  . Hypertension Brother   . Diabetes Brother   . Cancer Maternal Aunt     breast  . Stroke Maternal Uncle   . Hypertension Brother   . Hypertension Brother      Current outpatient prescriptions:  .  aspirin EC 81 MG tablet, Take 81 mg by mouth daily., Disp: , Rfl:  .   metoprolol succinate (TOPROL-XL) 25 MG 24 hr tablet, Take 1 tablet (25 mg total) by mouth daily., Disp: 90 tablet, Rfl: 0 .  Omega-3 Fatty Acids (FISH OIL PO), Take by mouth., Disp: , Rfl:  .  b complex vitamins capsule, Take 1 capsule by mouth daily. Reported on 10/20/2015, Disp: , Rfl:  .  cyclobenzaprine (FLEXERIL) 10 MG tablet, Take 1 tablet (10 mg total) by mouth 2 (two) times daily as needed for muscle spasms. (Patient not taking: Reported on 10/20/2015), Disp: 20 tablet, Rfl: 0 .  esomeprazole (NEXIUM) 40 MG capsule, Take 1 capsule (40 mg total) by mouth daily. (Patient not taking: Reported on 07/10/2014), Disp: 30 capsule, Rfl: 3 .  vitamin B-12 (CYANOCOBALAMIN) 1000 MCG tablet, Take 1,000 mcg by mouth daily. Reported on 10/20/2015, Disp: , Rfl:   Allergies  Allergen Reactions  . Lipitor [Atorvastatin]     Shakes, didn't feel good on it    Review of Systems Constitutional: -fever, -chills, -sweats, -unexpected weight change, -decreased appetite, -fatigue Allergy: -sneezing, -itching, -congestion Dermatology: -changing moles, --rash, -lumps ENT: -runny nose, -ear pain, -sore throat, -hoarseness, -sinus pain, -teeth pain, - ringing in ears, -hearing loss, -nosebleeds Cardiology: -  chest pain, -palpitations, -swelling, -difficulty breathing when lying flat, -waking up short of breath Respiratory: -cough, -shortness of breath, -difficulty breathing with exercise or exertion, -wheezing, -coughing up blood Gastroenterology: -abdominal pain, -nausea, -vomiting, -diarrhea, -constipation, -blood in stool, -changes in bowel movement, -difficulty swallowing or eating Hematology: -bleeding, -bruising  Musculoskeletal: -joint aches, -muscle aches, -joint swelling, -back pain, -neck pain, -cramping, -changes in gait Ophthalmology: denies vision changes, eye redness, itching, discharge Urology: -burning with urination, -difficulty urinating, -blood in urine, -urinary frequency, -urgency,  -incontinence Neurology: -headache, -weakness, -tingling, -numbness, -memory loss, -falls, -dizziness Psychology: -depressed mood, -agitation, -sleep problems     Objective:   Physical Exam  BP 140/98 mmHg  Pulse 66  Ht 6' (1.829 m)  Wt 203 lb (92.08 kg)  BMI 27.53 kg/m2  General appearance: alert, no distress, WD/WN, AA male Skin: left mid back with 48mm brown slight raised globular lesion, uniform color, well defined, few other scattered macules, left cheek with brown 40mm raised lesion, unchanged for years, no worrisome lesions HEENT: normocephalic, conjunctiva/corneas normal, sclerae anicteric, PERRLA, EOMi, nares patent, no discharge or erythema, pharynx normal Oral cavity: MMM, tongue normal, teeth normal Neck: supple, no lymphadenopathy, no thyromegaly, no masses, normal ROM, no bruits Chest: non tender, normal shape and expansion Heart: RRR, normal S1, S2, no murmurs Lungs: CTA bilaterally, no wheezes, rhonchi, or rales Abdomen: +bs, soft, non tender, non distended, no masses, no hepatomegaly, no splenomegaly, no bruits Back: non tender, normal ROM, no scoliosis Musculoskeletal: upper extremities non tender, no obvious deformity, normal ROM throughout, lower extremities non tender, no obvious deformity, normal ROM throughout Extremities: no edema, no cyanosis, no clubbing Pulses: 2+ symmetric, upper and lower extremities, normal cap refill Neurological: alert, oriented x 3, CN2-12 intact, strength normal upper extremities and lower extremities, sensation normal throughout, DTRs 2+ throughout, no cerebellar signs, gait normal Psychiatric: normal affect, behavior normal, pleasant  GU: normal male external genitalia, circumcised, brown 80mm raised benign appearing lesion of dorsal glans penis, unchanged since childhood per patient, non tender, no masses, no hernia, no lymphadenopathy Rectal: somewhat scaly rash midline at top of gluteal cleft, otherwise anus normal tone, prostate WNL,  no nodules   Adult ECG Report  Indication: hypertension  Rate: 63 bpm  Rhythm: normal sinus rhythm  QRS Axis: 21 degrees  PR Interval: 168 ms  QRS Duration: 79ms  QTc: 473ms  Conduction Disturbances: diffuse T wave inversoins throughout  Other Abnormalities: prolonged QT  Patient's cardiac risk factors are: dyslipidemia, hypertension, male gender and sedentary lifestyle.  EKG comparison: 2015 unchanged  Narrative Interpretation: abnormal EKG, diffuse T wave inversions, but no acute changes     Assessment and Plan :    Encounter Diagnoses  Name Primary?  . Encounter for health maintenance examination in adult Yes  . Essential hypertension   . Anxiety state   . Gastroesophageal reflux disease without esophagitis   . Dyslipidemia   . Noncompliance   . Screening for prostate cancer   . Rash     Physical exam - discussed healthy lifestyle, diet, exercise, preventative care, vaccinations, and addressed their concerns.   Labs today C/t Toprol.  Begin amlodipine, add pravastatin, C/t aspirin daily . discussed risks/benefits of mediation Refer to cardiology for updated evaluation of abnormal EKG, hypertension Begin triamcinolone ointment for 1-2 wk for rash at top of gluteal cleft   Patient Instructions  Recommendations:  See your eye doctor yearly for routine vision care.  See your dentist yearly for routine dental care including hygiene  visits twice yearly.  We will call with lab results  You will be due for colonoscopy at age 9   Get a flu shot in the fall, usually September  You will be due for Tetanus update next year on your physical  Exercise daily  Eat healthy foods, be the example for your family, limit salt, avoid lots of red meat, fast food, fired foods, sweets  Given your abnormal EKG, I recommend an updated cardiac evaluation  Continue Toprol, but I recommend we add Amlodipine for better blood pressure control  I recommend you continue Aspirin 81mg   daily at bedtime  I recommend we add Pravachol at bedtime to lower cholesterol and lower heart disease risk     Follow-up pending labs

## 2015-10-21 ENCOUNTER — Telehealth: Payer: Self-pay

## 2015-10-21 ENCOUNTER — Other Ambulatory Visit: Payer: Self-pay | Admitting: Medical

## 2015-10-21 LAB — CBC
HCT: 45.3 % (ref 38.5–50.0)
Hemoglobin: 14.9 g/dL (ref 13.2–17.1)
MCH: 29.7 pg (ref 27.0–33.0)
MCHC: 32.9 g/dL (ref 32.0–36.0)
MCV: 90.2 fL (ref 80.0–100.0)
MPV: 9.2 fL (ref 7.5–12.5)
Platelets: 335 10*3/uL (ref 140–400)
RBC: 5.02 MIL/uL (ref 4.20–5.80)
RDW: 13.6 % (ref 11.0–15.0)
WBC: 4.4 10*3/uL (ref 4.0–10.5)

## 2015-10-21 LAB — PSA: PSA: 1.18 ng/mL (ref <=4.00)

## 2015-10-21 LAB — MICROALBUMIN / CREATININE URINE RATIO
Creatinine, Urine: 188 mg/dL (ref 20–370)
Microalb Creat Ratio: 15 mcg/mg creat (ref <30)
Microalb, Ur: 2.8 mg/dL

## 2015-10-21 LAB — COMPREHENSIVE METABOLIC PANEL
ALT: 28 U/L (ref 9–46)
AST: 26 U/L (ref 10–40)
Albumin: 4.5 g/dL (ref 3.6–5.1)
Alkaline Phosphatase: 59 U/L (ref 40–115)
BUN: 14 mg/dL (ref 7–25)
CO2: 25 mmol/L (ref 20–31)
Calcium: 9.4 mg/dL (ref 8.6–10.3)
Chloride: 102 mmol/L (ref 98–110)
Creat: 1.02 mg/dL (ref 0.60–1.35)
Glucose, Bld: 113 mg/dL — ABNORMAL HIGH (ref 65–99)
Potassium: 4.4 mmol/L (ref 3.5–5.3)
Sodium: 138 mmol/L (ref 135–146)
Total Bilirubin: 0.6 mg/dL (ref 0.2–1.2)
Total Protein: 7.4 g/dL (ref 6.1–8.1)

## 2015-10-21 LAB — LIPID PANEL
Cholesterol: 274 mg/dL — ABNORMAL HIGH (ref 125–200)
HDL: 42 mg/dL (ref >=40)
LDL Cholesterol: 191 mg/dL — ABNORMAL HIGH (ref <130)
Total CHOL/HDL Ratio: 6.5 Ratio — ABNORMAL HIGH (ref <=5.0)
Triglycerides: 207 mg/dL — ABNORMAL HIGH (ref <150)
VLDL: 41 mg/dL — ABNORMAL HIGH (ref <30)

## 2015-10-21 LAB — TSH: TSH: 0.62 mIU/L (ref 0.40–4.50)

## 2015-10-21 LAB — HEMOGLOBIN A1C
Hgb A1c MFr Bld: 6.1 % — ABNORMAL HIGH (ref <5.7)
Mean Plasma Glucose: 128 mg/dL

## 2015-10-21 MED ORDER — METOPROLOL SUCCINATE ER 25 MG PO TB24: 25.0000 mg | ORAL_TABLET | Freq: Every day | ORAL | Status: DC

## 2015-10-21 NOTE — Telephone Encounter (Signed)
OV and stress test placed in your folder for review from Dr. Wynonia Lawman.

## 2015-10-22 ENCOUNTER — Telehealth: Payer: Self-pay | Admitting: Medical

## 2015-10-22 NOTE — Telephone Encounter (Signed)
Pt is aware and will call back for appt

## 2015-10-22 NOTE — Telephone Encounter (Signed)
I received records from Dr. Wynonia Lawman. No need to go back to cardiology at this time.   Abstract in his records, and lets see him back fasting in 9mo on BP and cholesterol.

## 2015-12-22 ENCOUNTER — Encounter: Payer: Self-pay | Admitting: Medical

## 2016-05-08 IMAGING — CT CT HEAD W/O CM
1 series · 16 of 30 positions shown, 20 images · non-contrast
Comparison: None.

CLINICAL DATA: Headache, dizziness.

EXAM:
CT HEAD WITHOUT CONTRAST
TECHNIQUE: Contiguous axial images were obtained from the base of the skull
through the vertex without intravenous contrast.

[Series 2: headseq 4.8 h45s · axial · 0.43mm/px · z∈[+1337,+1467]mm · 16 of 30 slices shown, 20 images]
[im 2/30  brain]
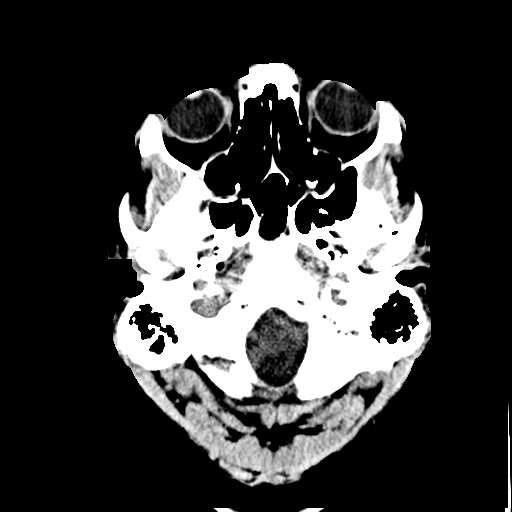
[im 2/30  bone]
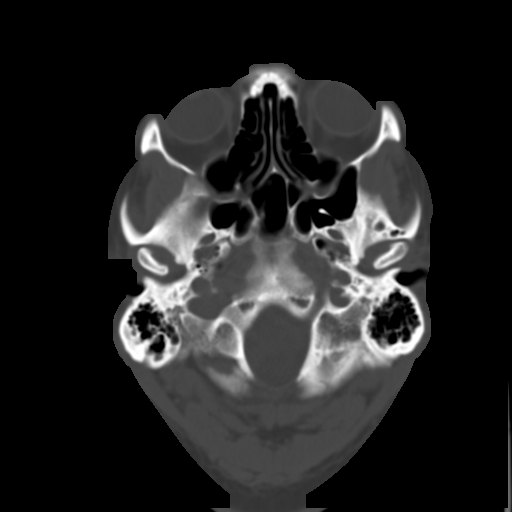
[im 4/30  brain]
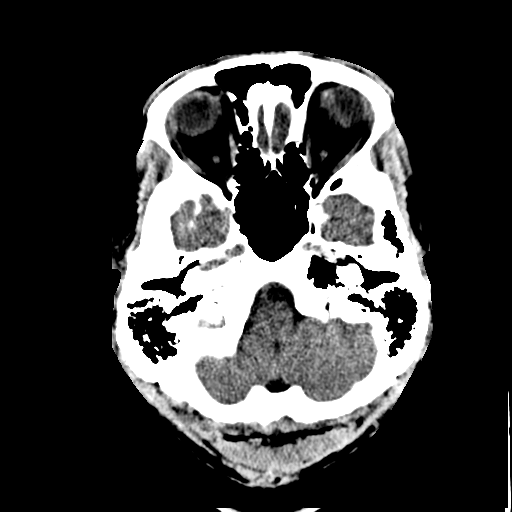
[im 6/30  brain]
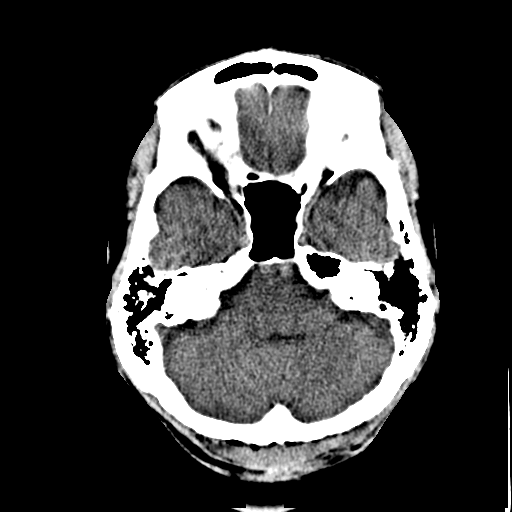
[im 8/30  brain]
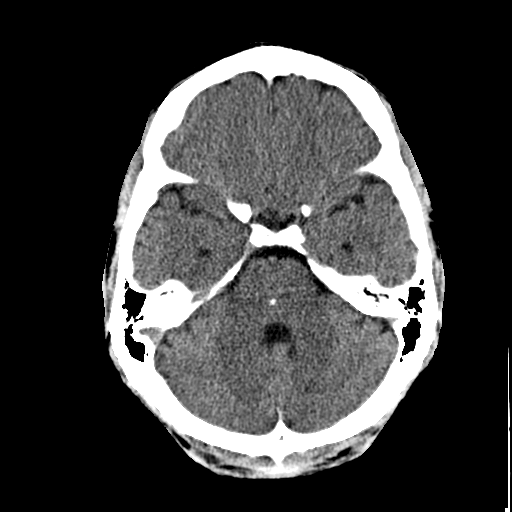
[im 9/30  brain]
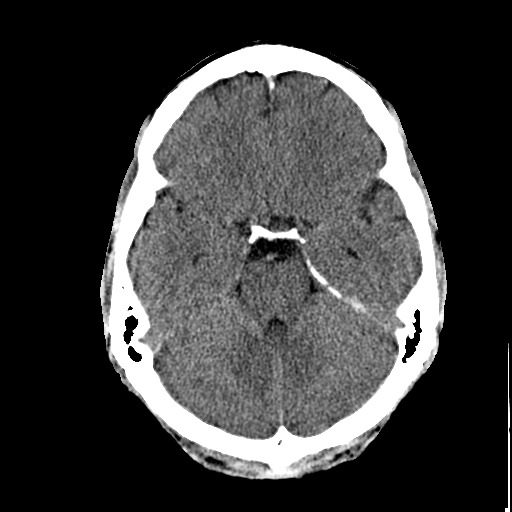
[im 9/30  bone]
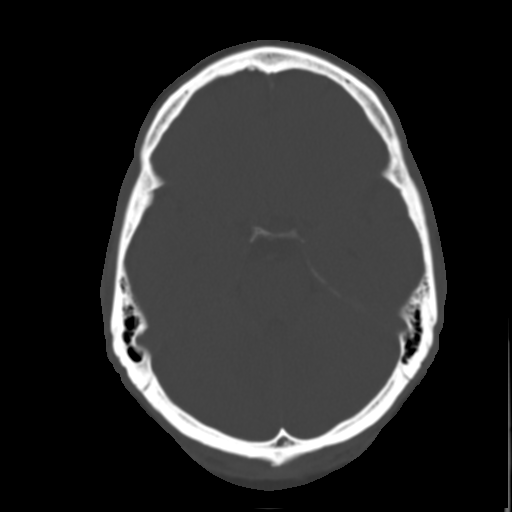
[im 11/30  brain]
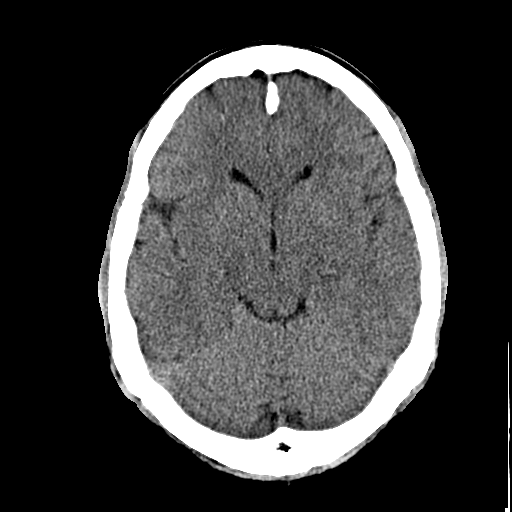
[im 13/30  brain]
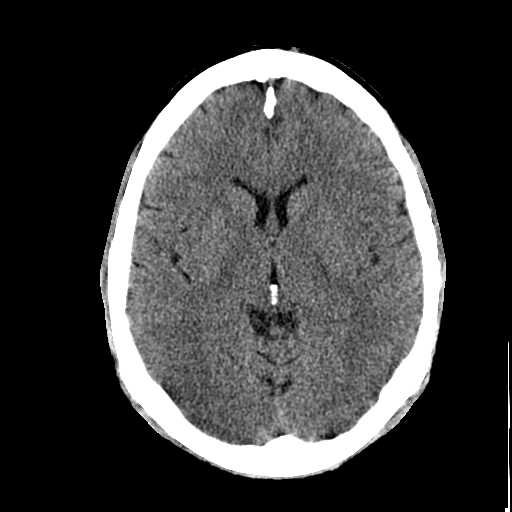
[im 15/30  brain]
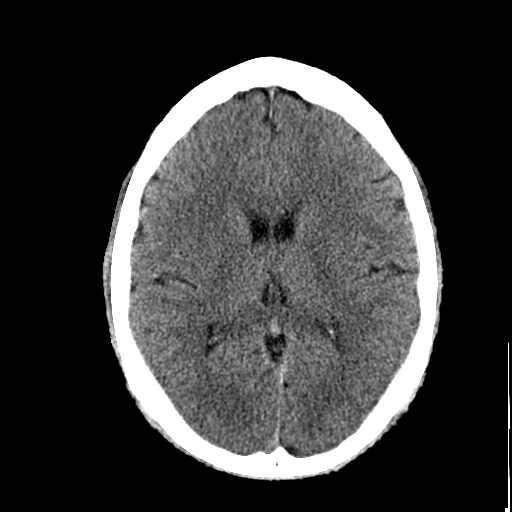
[im 16/30  brain]
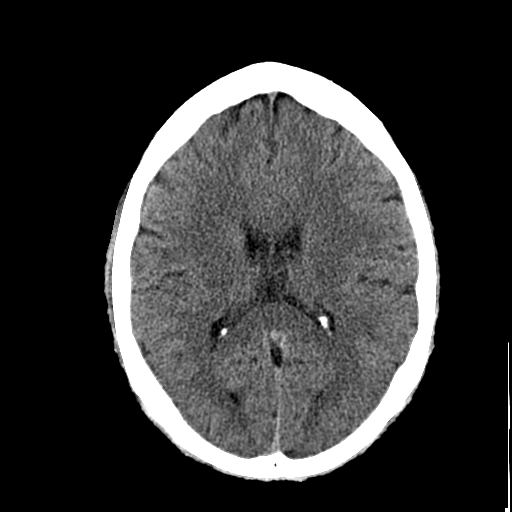
[im 16/30  bone]
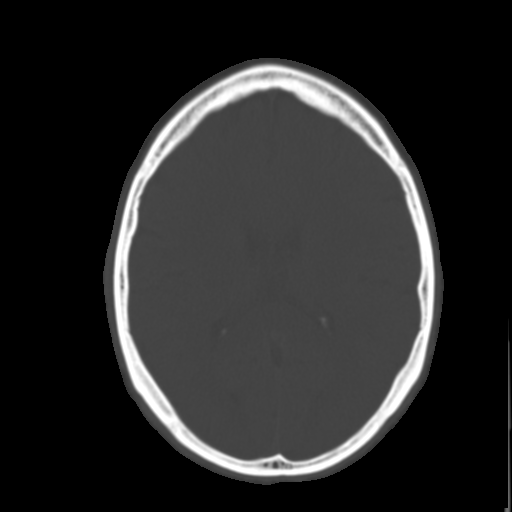
[im 18/30  brain]
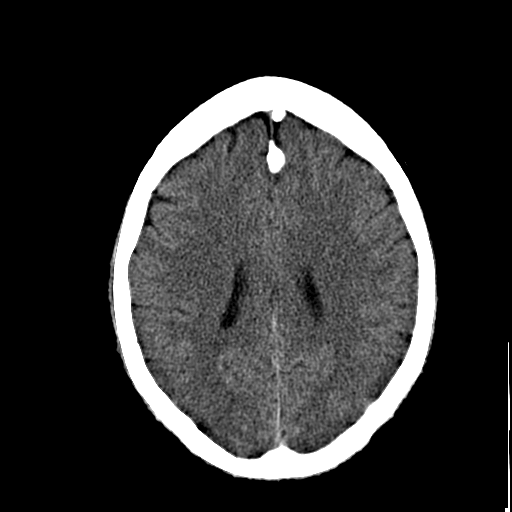
[im 20/30  brain]
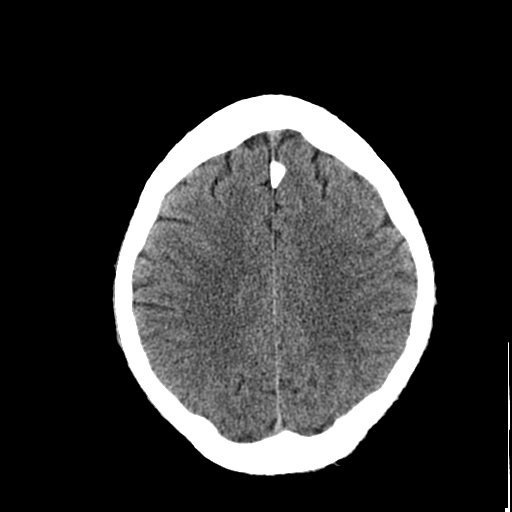
[im 22/30  brain]
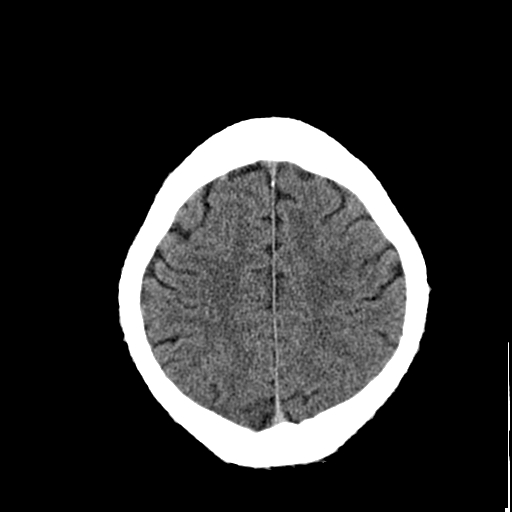
[im 23/30  brain]
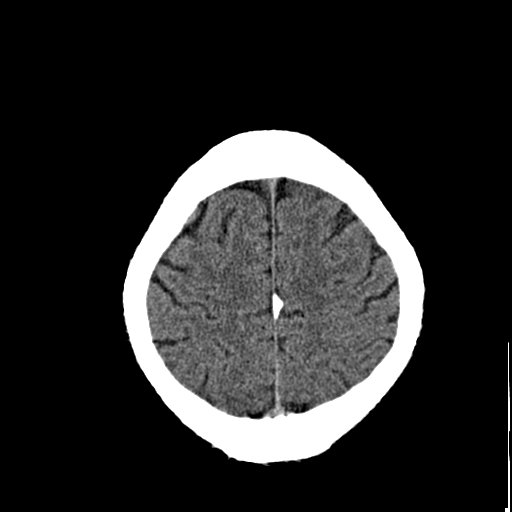
[im 23/30  bone]
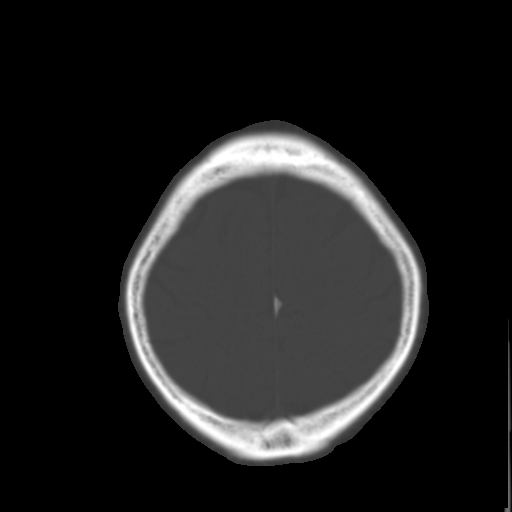
[im 25/30  brain]
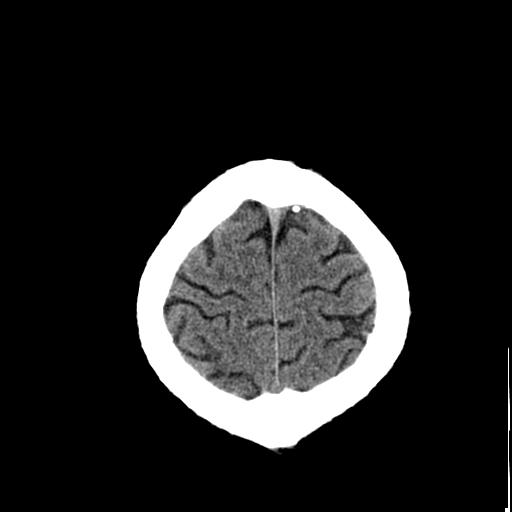
[im 27/30  brain]
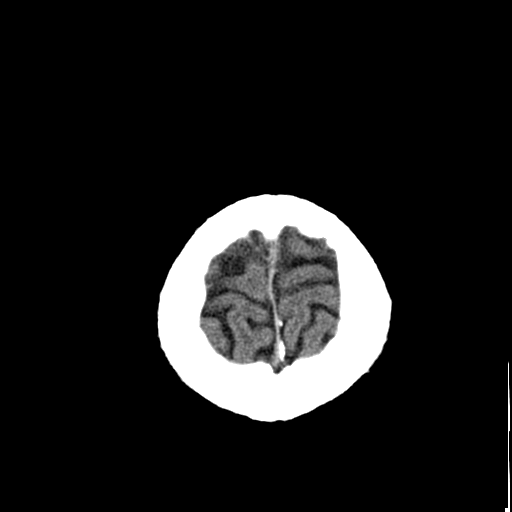
[im 29/30  brain]
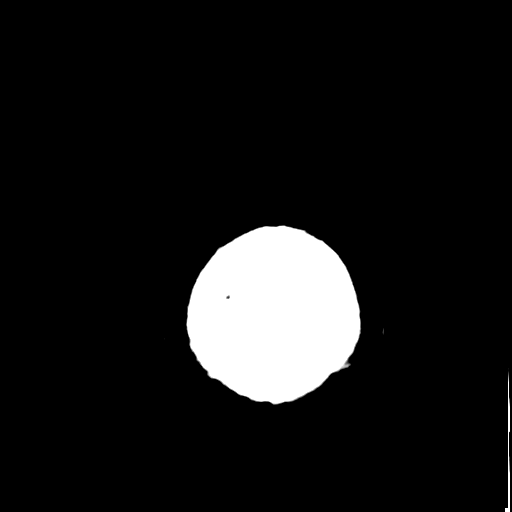

[16 of 30 positions shown; findings below may reference images not displayed]

FINDINGS: Bony calvarium appears intact. No mass effect or midline shift is
noted. Ventricular size is within normal limits. There is no
evidence of mass lesion, hemorrhage or acute infarction.
IMPRESSION: Normal head CT.

## 2016-11-21 ENCOUNTER — Other Ambulatory Visit: Payer: Self-pay | Admitting: Medical

## 2016-11-21 NOTE — Telephone Encounter (Signed)
Left message for pt he needs appointment

## 2016-12-25 ENCOUNTER — Other Ambulatory Visit: Payer: Self-pay | Admitting: Medical

## 2016-12-25 MED ORDER — METOPROLOL SUCCINATE ER 25 MG PO TB24: 25.0000 mg | ORAL_TABLET | Freq: Every day | ORAL | 0 refills | Status: DC

## 2017-01-08 ENCOUNTER — Encounter: Payer: Self-pay | Admitting: Medical

## 2017-01-08 ENCOUNTER — Ambulatory Visit (INDEPENDENT_AMBULATORY_CARE_PROVIDER_SITE_OTHER): Payer: 59 | Admitting: Medical

## 2017-01-08 VITALS — BP 142/82 | HR 67 | Ht 71.0 in | Wt 207.4 lb

## 2017-01-08 DIAGNOSIS — Z1211 Encounter for screening for malignant neoplasm of colon: Secondary | ICD-10-CM | POA: Diagnosis not present

## 2017-01-08 DIAGNOSIS — Z7189 Other specified counseling: Secondary | ICD-10-CM

## 2017-01-08 DIAGNOSIS — Z0000 Encounter for general adult medical examination without abnormal findings: Secondary | ICD-10-CM | POA: Diagnosis not present

## 2017-01-08 DIAGNOSIS — Z23 Encounter for immunization: Secondary | ICD-10-CM | POA: Diagnosis not present

## 2017-01-08 DIAGNOSIS — E785 Hyperlipidemia, unspecified: Secondary | ICD-10-CM | POA: Diagnosis not present

## 2017-01-08 DIAGNOSIS — Z7185 Encounter for immunization safety counseling: Secondary | ICD-10-CM | POA: Insufficient documentation

## 2017-01-08 DIAGNOSIS — I10 Essential (primary) hypertension: Secondary | ICD-10-CM | POA: Diagnosis not present

## 2017-01-08 DIAGNOSIS — N529 Male erectile dysfunction, unspecified: Secondary | ICD-10-CM

## 2017-01-08 MED ORDER — ICOSAPENT ETHYL 1 G PO CAPS: 2.0000 g | ORAL_CAPSULE | Freq: Two times a day (BID) | ORAL | 5 refills | Status: DC

## 2017-01-08 MED ORDER — METOPROLOL SUCCINATE ER 50 MG PO TB24: 50.0000 mg | ORAL_TABLET | Freq: Every day | ORAL | 3 refills | Status: DC

## 2017-01-08 NOTE — Progress Notes (Signed)
Subjective:   HPI  Omar Phillips is a 51 y.o. male who presents for physical Chief Complaint  Patient presents with  . Annual Exam    physical    Medical care team includes: Tysinger, Camelia Eng, PA-C here for primary care Dentist Eye doctor  Concerns: ED - having trouble keeping erections.   Wonders if its due to medications.  No CP, SOB, edema.  Never started Pravachol from last visit  compliant with Toprol  Daughter playing volleyball, eating  Reviewed their medical, surgical, family, social, medication, and allergy history and updated chart as appropriate.  Past Medical History:  Diagnosis Date  . Anxiety   . Chest pain 2006   hospital eval  . GERD (gastroesophageal reflux disease)   . History of cardiovascular stress test 2006   [facility]  . Hypertension     Past Surgical History:  Procedure Laterality Date  . LACERATION REPAIR     left forearm, remote past  . TESTICLE TORSION REDUCTION     teenage years    Social History   Social History  . Marital status: Married    Spouse name: N/A  . Number of children: N/A  . Years of education: N/A   Occupational History  . Not on file.   Social History Main Topics  . Smoking status: Never Smoker  . Smokeless tobacco: Never Used  . Alcohol use No  . Drug use: No  . Sexual activity: Not on file   Other Topics Concern  . 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   Exercise - nothing.  Diet - hit or miss, does eat some fried foods, yo yos between healthy/unhealthy diet.  As of 01/2016=8      Family History  Problem Relation Age of Onset  . Hypertension Mother   . Hypothyroidism Mother   . Diabetes Mother   . Hypertension Sister   . Other Father        history unknown  . Hypertension Brother   . Diabetes Brother   . Cancer Maternal  Aunt        breast  . Stroke Maternal Uncle   . Hypertension Brother   . Hypertension Brother   . Heart disease Neg Hx      Current Outpatient Prescriptions:  .  aspirin EC 81 MG tablet, Take 1 tablet (81 mg total) by mouth daily., Disp: 90 tablet, Rfl: 3 .  b complex vitamins capsule, Take 1 capsule by mouth daily. Reported on 10/20/2015, Disp: , Rfl:  .  Omega-3 Fatty Acids (FISH OIL PO), Take by mouth., Disp: , Rfl:  .  vitamin B-12 (CYANOCOBALAMIN) 1000 MCG tablet, Take 1,000 mcg by mouth daily. Reported on 10/20/2015, Disp: , Rfl:  .  Icosapent Ethyl (VASCEPA) 1 g CAPS, Take 2 g by mouth 2 (two) times daily., Disp: 120 capsule, Rfl: 5 .  metoprolol succinate (TOPROL-XL) 50 MG 24 hr tablet, Take 1 tablet (50 mg total) by mouth daily. Take with or immediately following a meal., Disp: 90 tablet, Rfl: 3  Allergies  Allergen Reactions  . Lipitor [Atorvastatin]     Shakes, didn't feel good on it     Review of Systems Constitutional: -fever, -chills, -sweats, -unexpected weight change, -decreased appetite, -fatigue Allergy: -sneezing, -itching, -congestion Dermatology: -changing moles, --rash, -lumps ENT: -runny nose, -ear pain, -sore  throat, -hoarseness, -sinus pain, -teeth pain, - ringing in ears, -hearing loss, -nosebleeds Cardiology: -chest pain, -palpitations, -swelling, -difficulty breathing when lying flat, -waking up short of breath Respiratory: -cough, -shortness of breath, -difficulty breathing with exercise or exertion, -wheezing, -coughing up blood Gastroenterology: -abdominal pain, -nausea, -vomiting, -diarrhea, -constipation, -blood in stool, -changes in bowel movement, -difficulty swallowing or eating Hematology: -bleeding, -bruising  Musculoskeletal: -joint aches, -muscle aches, -joint swelling, -back pain, -neck pain, -cramping, -changes in gait Ophthalmology: denies vision changes, eye redness, itching, discharge Urology: -burning with urination, -difficulty  urinating, -blood in urine, -urinary frequency, -urgency, -incontinence Neurology: -headache, -weakness, -tingling, -numbness, -memory loss, -falls, -dizziness Psychology: -depressed mood, -agitation, -sleep problems     Objective:   BP (!) 142/82   Pulse 67   Ht 5\' 11"  (1.803 m)   Wt 207 lb 6.4 oz (94.1 kg)   SpO2 97%   BMI 28.93 kg/m   General appearance: alert, no distress, WD/WN, AA male Skin: left mid back with 27mm brown slight raised globular lesion, uniform color, well defined, few other scattered macules, left cheek with brown 76mm raised lesion, unchanged for years, no worrisome lesions HEENT: normocephalic, conjunctiva/corneas normal, sclerae anicteric, PERRLA, EOMi, nares patent, no discharge or erythema, pharynx normal Oral cavity: MMM, tongue normal, teeth normal Neck: supple, no lymphadenopathy, no thyromegaly, no masses, normal ROM, no bruits Chest: non tender, normal shape and expansion Heart: RRR, normal S1, S2, no murmurs Lungs: CTA bilaterally, no wheezes, rhonchi, or rales Abdomen: +bs, soft, non tender, non distended, no masses, no hepatomegaly, no splenomegaly, no bruits Back: non tender, normal ROM, no scoliosis Musculoskeletal: upper extremities non tender, no obvious deformity, normal ROM throughout, lower extremities non tender, no obvious deformity, normal ROM throughout Extremities: no edema, no cyanosis, no clubbing Pulses: 2+ symmetric, upper and lower extremities, normal cap refill Neurological: alert, oriented x 3, CN2-12 intact, strength normal upper extremities and lower extremities, sensation normal throughout, DTRs 2+ throughout, no cerebellar signs, gait normal Psychiatric: normal affect, behavior normal, pleasant  GU: normal male external genitalia, circumcised, brown 37mm raised benign appearing lesion of dorsal glans penis, unchanged since childhood per patient, non tender, no masses, no hernia, no lymphadenopathy Rectal: deferred    Assessment  and Plan :    Encounter Diagnoses  Name Primary?  . Encounter for health maintenance examination in adult Yes  . Dyslipidemia   . Essential hypertension   . Erectile dysfunction, unspecified erectile dysfunction type   . Vaccine counseling   . Need for Tdap vaccination   . Screen for colon cancer     Physical exam - discussed and counseled on healthy lifestyle, diet, exercise, preventative care, vaccinations, sick and well care, proper use of emergency dept and after hours care, and addressed their concerns.    Health screening: See your eye doctor yearly for routine vision care. See your dentist yearly for routine dental care including hygiene visits twice yearly.  Cancer screening Discussed colonoscopy screening age 18yo unless higher risk for earlier screening Discussed PSA, prostate exam, and prostate cancer screening risks/benefits.   Discussed prostate symptoms as well.  Prostate screening performed: No  Vaccinations: Counseled on the following vaccines:  Influenza, but he declines Counseled on the Tdap (tetanus, diptheria, and acellular pertussis) vaccine.  Vaccine information sheet given. Tdap vaccine given after consent obtained.  Separate significant chronic issues discussed: hyperlipidemia - declines statin.   Agreeable to fish oil and diet and exercise changes.  F/u 40mo HTN - increase to Toprol 50mg   daily Work on lifestyle changes ED - discussed possible causes.    Labs including Testosterone today  Philbert was seen today for annual exam.  Diagnoses and all orders for this visit:  Encounter for health maintenance examination in adult -     Testosterone -     Comprehensive metabolic panel -     Hemoglobin A1c  Dyslipidemia  Essential hypertension -     Comprehensive metabolic panel -     Hemoglobin A1c  Erectile dysfunction, unspecified erectile dysfunction type -     Testosterone  Vaccine counseling  Need for Tdap vaccination  Screen for colon  cancer  Other orders -     Icosapent Ethyl (VASCEPA) 1 g CAPS; Take 2 g by mouth 2 (two) times daily. -     metoprolol succinate (TOPROL-XL) 50 MG 24 hr tablet; Take 1 tablet (50 mg total) by mouth daily. Take with or immediately following a meal.  Follow-up pending labs, yearly for physical

## 2017-01-09 ENCOUNTER — Other Ambulatory Visit: Payer: Self-pay

## 2017-01-09 LAB — COMPREHENSIVE METABOLIC PANEL
AG Ratio: 1.6 (calc) (ref 1.0–2.5)
ALT: 42 U/L (ref 9–46)
AST: 33 U/L (ref 10–40)
Albumin: 4.6 g/dL (ref 3.6–5.1)
Alkaline phosphatase (APISO): 73 U/L (ref 40–115)
BUN: 16 mg/dL (ref 7–25)
CO2: 27 mmol/L (ref 20–32)
Calcium: 9.7 mg/dL (ref 8.6–10.3)
Chloride: 101 mmol/L (ref 98–110)
Creat: 1.22 mg/dL (ref 0.60–1.35)
Globulin: 2.9 g/dL (calc) (ref 1.9–3.7)
Glucose, Bld: 148 mg/dL — ABNORMAL HIGH (ref 65–99)
Potassium: 4.1 mmol/L (ref 3.5–5.3)
Sodium: 138 mmol/L (ref 135–146)
Total Bilirubin: 0.6 mg/dL (ref 0.2–1.2)
Total Protein: 7.5 g/dL (ref 6.1–8.1)

## 2017-01-09 LAB — TESTOSTERONE: Testosterone: 290 ng/dL (ref 250–827)

## 2017-01-09 LAB — HEMOGLOBIN A1C
Hgb A1c MFr Bld: 6.5 % of total Hgb — ABNORMAL HIGH (ref <5.7)
Mean Plasma Glucose: 140 (calc)
eAG (mmol/L): 7.7 (calc)

## 2017-01-10 ENCOUNTER — Other Ambulatory Visit: Payer: Self-pay | Admitting: Medical

## 2017-01-10 DIAGNOSIS — R7989 Other specified abnormal findings of blood chemistry: Secondary | ICD-10-CM

## 2017-01-12 ENCOUNTER — Other Ambulatory Visit: Payer: 59

## 2017-01-12 DIAGNOSIS — R7989 Other specified abnormal findings of blood chemistry: Secondary | ICD-10-CM

## 2017-01-13 LAB — FSH/LH
FSH: 5.6 m[IU]/mL (ref 1.6–8.0)
LH: 7.7 m[IU]/mL (ref 1.5–9.3)

## 2017-01-13 LAB — PROLACTIN: Prolactin: 8.1 ng/mL (ref 2.0–18.0)

## 2017-01-13 LAB — TESTOSTERONE: Testosterone: 353 ng/dL (ref 250–827)

## 2017-01-13 LAB — PSA: PSA: 1.1 ng/mL (ref <=4.0)

## 2018-03-12 ENCOUNTER — Other Ambulatory Visit: Payer: Self-pay | Admitting: Medical

## 2018-03-13 ENCOUNTER — Telehealth: Payer: Self-pay

## 2018-03-13 ENCOUNTER — Other Ambulatory Visit: Payer: Self-pay | Admitting: Medical

## 2018-03-13 MED ORDER — METOPROLOL SUCCINATE ER 50 MG PO TB24: 50.0000 mg | ORAL_TABLET | Freq: Every day | ORAL | 0 refills | Status: DC

## 2018-03-13 NOTE — Telephone Encounter (Signed)
Patient called and scheduled and appt for 03/20/18. He said he can't come in before then because he is working on a high risk case. He is out of Metoprolol and wants to know if some can be sent in to last him until his appt. Please advise.

## 2018-03-13 NOTE — Telephone Encounter (Signed)
lmom informing patient that medication has been sent to pharmacy and we will see him next week.

## 2018-03-13 NOTE — Telephone Encounter (Signed)
Med sent.

## 2018-03-13 NOTE — Telephone Encounter (Signed)
lmom informing patient that he needs to schedule an appointment for further refills on Metoprolol.

## 2018-03-15 DIAGNOSIS — Z0289 Encounter for other administrative examinations: Secondary | ICD-10-CM

## 2018-03-20 ENCOUNTER — Encounter: Payer: 59 | Admitting: Medical

## 2018-05-29 ENCOUNTER — Encounter: Payer: Self-pay | Admitting: Medical

## 2018-05-29 ENCOUNTER — Ambulatory Visit: Payer: 59 | Admitting: Medical

## 2018-05-29 VITALS — BP 140/96 | HR 62 | Temp 98.1°F | Resp 16 | Ht 71.0 in | Wt 212.6 lb

## 2018-05-29 DIAGNOSIS — R7989 Other specified abnormal findings of blood chemistry: Secondary | ICD-10-CM | POA: Insufficient documentation

## 2018-05-29 DIAGNOSIS — R5383 Other fatigue: Secondary | ICD-10-CM | POA: Diagnosis not present

## 2018-05-29 DIAGNOSIS — E785 Hyperlipidemia, unspecified: Secondary | ICD-10-CM | POA: Diagnosis not present

## 2018-05-29 DIAGNOSIS — R7301 Impaired fasting glucose: Secondary | ICD-10-CM | POA: Insufficient documentation

## 2018-05-29 DIAGNOSIS — I10 Essential (primary) hypertension: Secondary | ICD-10-CM

## 2018-05-29 DIAGNOSIS — N529 Male erectile dysfunction, unspecified: Secondary | ICD-10-CM

## 2018-05-29 MED ORDER — QUINAPRIL HCL 20 MG PO TABS: ORAL_TABLET | ORAL | 1 refills | Status: DC

## 2018-05-29 NOTE — Progress Notes (Signed)
Subjective:     Patient ID: Omar Omar, male   DOB: 1965-11-26, 53 y.o.   MRN: [id number]  HPI Chief Complaint  Patient presents with  . follow up    folllow up BP    Here for f/u on BP.   Last visit here over a year ago, 2018.  Main concern is fatigue.  Has been taking Toprol Xl 50mg  daily since last visit but at one point 4 months ago started taking 1/2 tablet daily due to fatigue. Thought the fatigue may be related to the medication.  Using OTC fish oil, taking B12 supplement OTC.  Recently about a month ago had life insurance changes, had life insurance physical including labs.    At that time BP was 124/83, but he notes they mentioned cholesterol too high but not sugar.    Sleep - some days better than others.   Some nights toss and turn, some nights gets good sleep.   Overall feels good.  Sometimes snores if really tired.  No witnessed apnea.   Usually awakes rested.  Denies daytime somnolence.  Exercising, doing some basketball.  No other aggravating or relieving factors. No other complaint.  Past Medical History:  Diagnosis Date  . Anxiety   . Chest pain 2006   hospital eval  . GERD (gastroesophageal reflux disease)   . History of cardiovascular stress test 2006   [place]  . Hypertension    Current Outpatient Medications on File Prior to Visit  Medication Sig Dispense Refill  . aspirin EC 81 MG tablet Take 1 tablet (81 mg total) by mouth daily. 90 tablet 3  . b complex vitamins capsule Take 1 capsule by mouth daily. Reported on 10/20/2015    . Omega-3 Fatty Acids (FISH OIL PO) Take by mouth.    . vitamin B-12 (CYANOCOBALAMIN) 1000 MCG tablet Take 1,000 mcg by mouth daily. Reported on 10/20/2015     No current facility-administered medications on file prior to visit.      Review of Systems ROS as in subjective     Objective:   Physical Exam BP (!) 140/96   Pulse 62   Temp 98.1 F (36.7 C) (Oral)   Resp 16   Ht 5\' 11"  (1.803 m)   Wt 212 lb 9.6  oz (96.4 kg)   SpO2 98%   BMI 29.65 kg/m   Wt Readings from Last 3 Encounters:  05/29/18 212 lb 9.6 oz (96.4 kg)  01/08/17 207 lb 6.4 oz (94.1 kg)  10/20/15 203 lb (92.1 kg)   BP Readings from Last 3 Encounters:  05/29/18 (!) 140/96  01/08/17 (!) 142/82  10/20/15 (!) 140/98     General appearance: alert, no distress, WD/WN,  Oral cavity: MMM, no lesions Neck: supple, no lymphadenopathy, no thyromegaly, no masses Heart: RRR, normal S1, S2, no murmurs Lungs: CTA bilaterally, no wheezes, rhonchi, or rales Extremities: no edema, no cyanosis, no clubbing Pulses: 2+ symmetric, upper and lower extremities, normal cap refill Neurological: alert, oriented x 3, CN2-12 intact, strength normal upper extremities and lower extremities, sensation normal throughout, DTRs 2+ throughout, no cerebellar signs, gait normal Psychiatric: normal affect, behavior normal, pleasant       Assessment:     Encounter Diagnoses  Name Primary?  . Essential hypertension Yes  . Dyslipidemia   . Low testosterone   . Other fatigue   . Impaired fasting glucose   . Erectile dysfunction, unspecified erectile dysfunction type        Plan:  Discussed possible causes of fatigue including high blood pressure, medication induced, thyroid issues, sleep apnea, low testosterone or other.  His symptoms could be sluggishness from being on a beta-blocker.  We originally started him on a beta-blocker a few years ago for both blood pressure and anxious mood.  From prior visits, he had low testosterone.  We discussed sleep apnea but he does not have real significant risk for sleep apnea.  At this point we will stop beta-blocker and change him to quinapril.  He will watch for improvements in symptoms of fatigue and erection fullness over the next few weeks.  We discussed possible side effects and risk of quinapril.  Continue regular exercise, glad to hear he has made some diet changes and improvements, and we will plan  to follow-up in 1 month  He will get me copy of recent labs from insurance  Cayce was seen today for follow up.  Diagnoses and all orders for this visit:  Essential hypertension  Dyslipidemia  Low testosterone  Other fatigue  Impaired fasting glucose  Erectile dysfunction, unspecified erectile dysfunction type  Other orders -     quinapril (ACCUPRIL) 20 MG tablet; 1/2 tablet daily for 5 days, then change to 1 tablet daily

## 2018-06-17 ENCOUNTER — Other Ambulatory Visit: Payer: Self-pay | Admitting: Medical

## 2018-06-27 ENCOUNTER — Ambulatory Visit: Payer: 59 | Admitting: Medical

## 2018-08-09 ENCOUNTER — Other Ambulatory Visit: Payer: Self-pay | Admitting: Medical

## 2018-08-12 NOTE — Telephone Encounter (Signed)
Is this ok to refill?  

## 2018-08-12 NOTE — Telephone Encounter (Signed)
Get him in for physical.   Refill x 30 days

## 2018-08-26 ENCOUNTER — Other Ambulatory Visit: Payer: Self-pay

## 2018-08-26 ENCOUNTER — Encounter: Payer: Self-pay | Admitting: Medical

## 2018-08-26 ENCOUNTER — Ambulatory Visit: Payer: 59 | Admitting: Medical

## 2018-08-26 VITALS — BP 128/90 | HR 68 | Temp 97.8°F | Resp 18 | Ht 72.0 in | Wt 208.0 lb

## 2018-08-26 DIAGNOSIS — F411 Generalized anxiety disorder: Secondary | ICD-10-CM

## 2018-08-26 DIAGNOSIS — Z1211 Encounter for screening for malignant neoplasm of colon: Secondary | ICD-10-CM | POA: Diagnosis not present

## 2018-08-26 DIAGNOSIS — Z0000 Encounter for general adult medical examination without abnormal findings: Secondary | ICD-10-CM | POA: Diagnosis not present

## 2018-08-26 DIAGNOSIS — Z125 Encounter for screening for malignant neoplasm of prostate: Secondary | ICD-10-CM

## 2018-08-26 DIAGNOSIS — I10 Essential (primary) hypertension: Secondary | ICD-10-CM

## 2018-08-26 NOTE — Progress Notes (Signed)
Subjective:   HPI  Omar Phillips is a 53 y.o. male who presents for Chief Complaint  Patient presents with  . Annual Exam    fasting annual exam, no concerns. Could not give UA. Is going to schedule eye exam in the next couple weeks. Has not yet had colonoscopy.     Patient Care Team: , [name] as PCP - General (Family Medicine) Sees dentist Sees eye doctor  Concerns: Compliant with blood pressure medication no complaints.  No chest pain or shortness of breath or dyspnea on exertion.  He exercises regularly, particularly with biking.  Home blood pressures usually are normal  His only new complaint is this past week he had 2 nights where he had urination about 4-5 times during the night.  He normally gets up to urinate 1-2 times per night.  He does drink a lot of water.  Reviewed their medical, surgical, family, social, medication, and allergy history and updated chart as appropriate.  Past Medical History:  Diagnosis Date  . Anxiety   . Chest pain 2006   hospital eval  . GERD (gastroesophageal reflux disease)   . History of cardiovascular stress test 2006   [place]  . Hypertension     Past Surgical History:  Procedure Laterality Date  . LACERATION REPAIR     left forearm, remote past  . TESTICLE TORSION REDUCTION     teenage years    Social History   Socioeconomic History  . Marital status: Married    Spouse name: Not on file  . Number of children: Not on file  . Years of education: Not on file  . Highest education level: Not on file  Occupational History  . Not on file  Social Needs  . Financial resource strain: Not on file  . Food insecurity:    Worry: Not on file    Inability: Not on file  . Transportation needs:    Medical: Not on file    Non-medical: Not on file  Tobacco Use  . Smoking status: Never Smoker  . Smokeless tobacco: Never Used  Substance and Sexual Activity  . Alcohol use: No  . Drug use: No  . Sexual  activity: Not on file  Lifestyle  . Physical activity:    Days per week: Not on file    Minutes per session: Not on file  . Stress: Not on file  Relationships  . Social connections:    Talks on phone: Not on file    Gets together: Not on file    Attends religious service: Not on file    Active member of club or organization: Not on file    Attends meetings of clubs or organizations: Not on file    Relationship status: Not on file  . Intimate partner violence:    Fear of current or ex partner: Not on file    Emotionally abused: Not on file    Physically abused: Not on file    Forced sexual activity: Not on file  Other Topics Concern  . 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   Exercise - nothing.  Diet - hit or miss, does eat some fried foods, yo yos between healthy/unhealthy diet.  As of 01/2016=8      Family History  Problem Relation Age of Onset  . Hypertension Mother   .  Hypothyroidism Mother   . Diabetes Mother   . Hypertension Sister   . Other Father        history unknown  . Hypertension Brother   . Diabetes Brother   . Cancer Maternal Aunt        breast  . Stroke Maternal Uncle   . Hypertension Brother   . Hypertension Brother   . Heart disease Neg Hx      Current Outpatient Medications:  .  aspirin EC 81 MG tablet, Take 1 tablet (81 mg total) by mouth daily., Disp: 90 tablet, Rfl: 3 .  b complex vitamins capsule, Take 1 capsule by mouth daily. Reported on 10/20/2015, Disp: , Rfl:  .  Omega-3 Fatty Acids (FISH OIL PO), Take by mouth., Disp: , Rfl:  .  quinapril (ACCUPRIL) 20 MG tablet, 1 TAB DAILY, Disp: 30 tablet, Rfl: 0 .  vitamin B-12 (CYANOCOBALAMIN) 1000 MCG tablet, Take 1,000 mcg by mouth daily. Reported on 10/20/2015, Disp: , Rfl:   Allergies  Allergen Reactions  . Lipitor [Atorvastatin]      Shakes, didn't feel good on it       Review of Systems Constitutional: -fever, -chills, -sweats, -unexpected weight change, -decreased appetite, -fatigue Allergy: -sneezing, -itching, -congestion Dermatology: -changing moles, --rash, -lumps ENT: -runny nose, -ear pain, -sore throat, -hoarseness, -sinus pain, -teeth pain, - ringing in ears, -hearing loss, -nosebleeds Cardiology: -chest pain, -palpitations, -swelling, -difficulty breathing when lying flat, -waking up short of breath Respiratory: -cough, -shortness of breath, -difficulty breathing with exercise or exertion, -wheezing, -coughing up blood Gastroenterology: -abdominal pain, -nausea, -vomiting, -diarrhea, -constipation, -blood in stool, -changes in bowel movement, -difficulty swallowing or eating Hematology: -bleeding, -bruising  Musculoskeletal: -joint aches, -muscle aches, -joint swelling, -back pain, -neck pain, -cramping, -changes in gait Ophthalmology: denies vision changes, eye redness, itching, discharge Urology: -burning with urination, -difficulty urinating, -blood in urine, -urinary frequency, -urgency, -incontinence Neurology: -headache, -weakness, -tingling, -numbness, -memory loss, -falls, -dizziness Psychology: -depressed mood, -agitation, -sleep problems Male GU: no testicular mass, pain, no lymph nodes swollen, no swelling, no rash.     Objective:  BP 128/90   Pulse 68   Temp 97.8 F (36.6 C) (Tympanic)   Resp 18   Ht 6' (1.829 m)   Wt 208 lb (94.3 kg)   BMI 28.21 kg/m   General appearance: alert, no distress, WD/WN, African American male Skin: unremarkable HEENT: normocephalic, conjunctiva/corneas normal, sclerae anicteric, PERRLA, EOMi, nares patent, no discharge or erythema, pharynx normal Oral cavity: MMM, tongue normal, teeth normal Neck: supple, no lymphadenopathy, no thyromegaly, no masses, normal ROM, no bruits Chest: non tender, normal shape and expansion Heart: RRR, normal S1, S2, no  murmurs Lungs: CTA bilaterally, no wheezes, rhonchi, or rales Abdomen: +bs, soft, non tender, non distended, no masses, no hepatomegaly, no splenomegaly, no bruits Back: non tender, normal ROM, no scoliosis Musculoskeletal: upper extremities non tender, no obvious deformity, normal ROM throughout, lower extremities non tender, no obvious deformity, normal ROM throughout Extremities: no edema, no cyanosis, no clubbing Pulses: 2+ symmetric, upper and lower extremities, normal cap refill Neurological: alert, oriented x 3, CN2-12 intact, strength normal upper extremities and lower extremities, sensation normal throughout, DTRs 2+ throughout, no cerebellar signs, gait normal Psychiatric: normal affect, behavior normal, pleasant  GU: normal male external genitalia,circumcised, nontender, no masses, no hernia, no lymphadenopathy Rectal: prostate mildly enlarged, slight bogginess, no nodules  EKG Normal sinus rhythm, rate 62 bpm, PR 174 ms, QRS 82 ms, QTC 458 ms, Axis 18 degrees,  T wave abnormality, compared to 2017 EKG, no acute changes   Assessment and Plan :   Encounter Diagnoses  Name Primary?  . Encounter for health maintenance examination in adult Yes  . Essential hypertension   . Anxiety state   . Screen for colon cancer   . Screening for prostate cancer     Physical exam - discussed and counseled on healthy lifestyle, diet, exercise, preventative care, vaccinations, sick and well care, proper use of emergency dept and after hours care, and addressed their concerns.    Health screening: See your eye doctor yearly for routine vision care. See your dentist yearly for routine dental care including hygiene visits twice yearly.  Cancer screening Referred for Cologard  Discussed PSA, prostate exam, and prostate cancer screening risks/benefits.     Vaccinations: Advised yearly influenza vaccine   Acute issues discussed: Likely recent prostatitis that was mild and resolved on its  own  Separate significant chronic issues discussed: HTN - c/t same medication   Rodriques was seen today for annual exam.  Diagnoses and all orders for this visit:  Encounter for health maintenance examination in adult -     EKG 12-Lead -     Comprehensive metabolic panel -     CBC -     PSA -     Cologuard  Essential hypertension  Anxiety state  Screen for colon cancer -     Cologuard  Screening for prostate cancer -     CBC    Follow-up pending labs, yearly for physical

## 2018-08-27 ENCOUNTER — Other Ambulatory Visit: Payer: Self-pay | Admitting: Medical

## 2018-08-27 LAB — CBC
Hematocrit: 43.6 % (ref 37.5–51.0)
Hemoglobin: 15.2 g/dL (ref 13.0–17.7)
MCH: 30.7 pg (ref 26.6–33.0)
MCHC: 34.9 g/dL (ref 31.5–35.7)
MCV: 88 fL (ref 79–97)
Platelets: 398 10*3/uL (ref 150–450)
RBC: 4.95 x10E6/uL (ref 4.14–5.80)
RDW: 12.3 % (ref 11.6–15.4)
WBC: 4.6 10*3/uL (ref 3.4–10.8)

## 2018-08-27 LAB — COMPREHENSIVE METABOLIC PANEL
ALT: 29 IU/L (ref 0–44)
AST: 30 IU/L (ref 0–40)
Albumin/Globulin Ratio: 1.6 (ref 1.2–2.2)
Albumin: 4.7 g/dL (ref 3.8–4.9)
Alkaline Phosphatase: 82 IU/L (ref 39–117)
BUN/Creatinine Ratio: 11 (ref 9–20)
BUN: 14 mg/dL (ref 6–24)
Bilirubin Total: 0.4 mg/dL (ref 0.0–1.2)
CO2: 23 mmol/L (ref 20–29)
Calcium: 9.7 mg/dL (ref 8.7–10.2)
Chloride: 100 mmol/L (ref 96–106)
Creatinine, Ser: 1.22 mg/dL (ref 0.76–1.27)
GFR calc Af Amer: 79 mL/min/{1.73_m2} (ref >59)
GFR calc non Af Amer: 68 mL/min/{1.73_m2} (ref >59)
Globulin, Total: 2.9 g/dL (ref 1.5–4.5)
Glucose: 106 mg/dL — ABNORMAL HIGH (ref 65–99)
Potassium: 4.8 mmol/L (ref 3.5–5.2)
Sodium: 138 mmol/L (ref 134–144)
Total Protein: 7.6 g/dL (ref 6.0–8.5)

## 2018-08-27 LAB — PSA: Prostate Specific Ag, Serum: 5.2 ng/mL — ABNORMAL HIGH (ref 0.0–4.0)

## 2018-08-27 MED ORDER — ASPIRIN EC 81 MG PO TBEC: 81.0000 mg | DELAYED_RELEASE_TABLET | Freq: Every day | ORAL | 3 refills | Status: DC

## 2018-08-27 MED ORDER — QUINAPRIL HCL 20 MG PO TABS: ORAL_TABLET | ORAL | 3 refills | Status: DC

## 2018-08-29 LAB — HGB A1C W/O EAG: Hgb A1c MFr Bld: 6.8 % — ABNORMAL HIGH (ref 4.8–5.6)

## 2018-08-29 LAB — SPECIMEN STATUS REPORT

## 2018-09-09 ENCOUNTER — Telehealth: Payer: Self-pay

## 2018-09-09 NOTE — Telephone Encounter (Signed)
I assume we sent the referral

## 2018-09-09 NOTE — Telephone Encounter (Signed)
Per patient he has not sent anything in the mail for cologuard.  I gave him the phone # and he is going to call them to check on where the kit was delivered.

## 2018-09-09 NOTE — Telephone Encounter (Signed)
Yes we did.

## 2019-01-21 ENCOUNTER — Telehealth: Payer: Self-pay | Admitting: Medical

## 2019-01-21 NOTE — Telephone Encounter (Signed)
Pt called and states that he has never received his colo guard in the mail if you could please check on this for the pt pt can be reached at [phone], and confirmed pts address and it is correct in the system

## 2019-01-21 NOTE — Telephone Encounter (Signed)
Patient called back and stated he was contacted by [employer] and to disregard his call to the office.

## 2019-01-30 ENCOUNTER — Telehealth: Payer: Self-pay

## 2019-01-30 NOTE — Telephone Encounter (Signed)
lvm for pt to ask if he completed his cologuard  To close gap. [place]

## 2019-04-02 ENCOUNTER — Ambulatory Visit: Payer: 59 | Admitting: Medical

## 2019-04-02 ENCOUNTER — Encounter: Payer: Self-pay | Admitting: Medical

## 2019-04-02 ENCOUNTER — Other Ambulatory Visit: Payer: Self-pay

## 2019-04-02 VITALS — BP 160/100 | HR 69 | Temp 97.7°F | Ht 72.0 in | Wt 204.0 lb

## 2019-04-02 DIAGNOSIS — I10 Essential (primary) hypertension: Secondary | ICD-10-CM

## 2019-04-02 DIAGNOSIS — Z566 Other physical and mental strain related to work: Secondary | ICD-10-CM

## 2019-04-02 DIAGNOSIS — F419 Anxiety disorder, unspecified: Secondary | ICD-10-CM | POA: Diagnosis not present

## 2019-04-02 DIAGNOSIS — Z6379 Other stressful life events affecting family and household: Secondary | ICD-10-CM | POA: Diagnosis not present

## 2019-04-02 DIAGNOSIS — N529 Male erectile dysfunction, unspecified: Secondary | ICD-10-CM

## 2019-04-02 MED ORDER — ESCITALOPRAM OXALATE 10 MG PO TABS: 10.0000 mg | ORAL_TABLET | Freq: Every day | ORAL | 2 refills | Status: DC

## 2019-04-02 MED ORDER — METOPROLOL SUCCINATE ER 25 MG PO TB24: 25.0000 mg | ORAL_TABLET | Freq: Every day | ORAL | 2 refills | Status: DC

## 2019-04-02 MED ORDER — CLONAZEPAM 0.5 MG PO TABS: 0.5000 mg | ORAL_TABLET | Freq: Every evening | ORAL | 0 refills | Status: DC | PRN

## 2019-04-02 NOTE — Progress Notes (Signed)
Subjective:  Omar Phillips is a 53 y.o. male who presents for Chief Complaint  Patient presents with  . Hypertension    elevated yesterday   . Stress     Here for follow-up on high blood pressure.  Back in May he was compliant with medication.  However yesterday he was seen in the emergency department.   Due to elevated blood pressures.  He notes a lot of family stress in recent weeks.   Back in 2004 had problems with anxiety and elevated BP.   recently been under a lot of stress.  Nephew got killed recently in 11/2018, has had lot of family dysfunction since then.  Causing a lot of stress, interfering with his job, day to day function.  Been having fears about dying.  Lots of family recently died of cancer.  Seeing people around him die right and left.   Yesterday was at dentist to get implant.   170/103 BP yesterday at dentist office.   10 minutes later they check and it was 180/110.  Went to ED in [place].  They talked about anxiety, stress, BP medications.    Back in 05/2018 we changed off Toprol due to fatigue and ED, changed to quinapril 20mg  daily.    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    No other c/o.   Past Medical History:  Diagnosis Date  . Anxiety   . Chest pain 2006   hospital eval  . GERD (gastroesophageal reflux disease)   . History of cardiovascular stress test 2006   [place]  . Hypertension    Current Outpatient Medications on File Prior to Visit  Medication Sig Dispense Refill  . aspirin EC 81 MG tablet Take 1 tablet (81 mg total) by mouth daily. 90 tablet 3  . b complex vitamins capsule Take 1 capsule by mouth daily. Reported on 10/20/2015    . Omega-3 Fatty Acids (FISH OIL PO) Take by mouth.    . quinapril (ACCUPRIL) 20 MG tablet 1 TAB DAILY 90 tablet 3  . escitalopram (LEXAPRO) 10 MG tablet Take 1 tablet (10 mg  total) by mouth daily. 30 tablet 3  . vitamin B-12 (CYANOCOBALAMIN) 1000 MCG tablet Take 1,000 mcg by mouth daily. Reported on 10/20/2015     No current facility-administered medications on file prior to visit.    Family History  Problem Relation Age of Onset  . Hypertension Mother   . Hypothyroidism Mother   . Diabetes Mother   . Hypertension Sister   . Other Father        history unknown  . Hypertension Brother   . Diabetes Brother   . Cancer Maternal Aunt        breast  . Stroke Maternal Uncle   . Hypertension Brother   . Hypertension Brother   . Heart disease Neg Hx     The following portions of the patient's history were reviewed and updated as appropriate: allergies, current medications, past family history, past medical history, past social history, past surgical history and problem list.  ROS Otherwise as in subjective above     Objective: BP (!) 160/100   Pulse 69   Temp 97.7 F (36.5 C)   Ht 6' (1.829 m)   Wt 204 lb (92.5 kg)   SpO2 98%   BMI 27.67 kg/m   BP Readings from Last  3 Encounters:  04/02/19 (!) 160/100  08/26/18 128/90  05/29/18 (!) 140/96   Wt Readings from Last 3 Encounters:  04/02/19 204 lb (92.5 kg)  08/26/18 208 lb (94.3 kg)  05/29/18 212 lb 9.6 oz (96.4 kg)    General appearance: alert, no distress, well developed, well nourished Neck: supple, no lymphadenopathy, no thyromegaly, no masses Heart: RRR, normal S1, S2, no murmurs Lungs: CTA bilaterally, no wheezes, rhonchi, or rales Pulses: 2+ radial pulses, 2+ pedal pulses, normal cap refill Ext: no edema Neuro 2 through 12 intact, nonfocal exam Psych: Pleasant, good eye contact, answers questions appropriately but seems a little stressed    Assessment: Encounter Diagnoses  Name Primary?  . Essential hypertension Yes  . Anxiety   . Erectile dysfunction, unspecified erectile dysfunction type   . Stress due to illness of family member   . Work stress      Plan: We  discussed his concerns, recent elevated blood pressure readings at the dentist, stress at work and at home and the recent deaths in his family.  I expressed sympathy for his loved ones.  We discussed medication options, prior medications including Toprol and his current quinapril.  We discussed risk and benefits of medications.  Continue quinapril, begin back on lower dose Toprol.  He is concerned about erectile dysfunction aggravated by the medication.  Counseled on diet, exercise, sleep, stress reduction  Begin back on Lexapro.  He has been on this in the past with good response.  Begin short-term Clonazepam for acute anxiety and sleep issues.  Discussed proper use of medicines, risk and benefits of medication.  Encourage counseling.  Counseled on stress reduction, regular exercise.  Follow-up in 4 to 6 weeks   Omar Phillips was seen today for hypertension and stress.  Diagnoses and all orders for this visit:  Essential hypertension  Anxiety  Erectile dysfunction, unspecified erectile dysfunction type  Stress due to illness of family member  Work stress  Other orders -     escitalopram (LEXAPRO) 10 MG tablet; Take 1 tablet (10 mg total) by mouth daily. -     clonazePAM (KLONOPIN) 0.5 MG tablet; Take 1 tablet (0.5 mg total) by mouth at bedtime as needed for anxiety. -     metoprolol succinate (TOPROL-XL) 25 MG 24 hr tablet; Take 1 tablet (25 mg total) by mouth daily.    Follow up: 4-6 weeks

## 2019-04-25 ENCOUNTER — Telehealth: Payer: Self-pay

## 2019-04-25 ENCOUNTER — Other Ambulatory Visit: Payer: Self-pay | Admitting: Medical

## 2019-04-25 MED ORDER — SILDENAFIL CITRATE 50 MG PO TABS: 50.0000 mg | ORAL_TABLET | ORAL | 1 refills | Status: DC | PRN

## 2019-04-25 NOTE — Telephone Encounter (Signed)
Pt. Called stating that his recent changes in medications is messing with his libido and he can't have that, needs something called in asap to help him with that , recent meds are Metoprolol, Lexapro, and clonazepam. Pt. Last seen 04/02/19.

## 2019-04-25 NOTE — Telephone Encounter (Signed)
I called LM with pt. That medication has been sent in.

## 2019-04-25 NOTE — Telephone Encounter (Signed)
I called pt. And he wants to stay with all the current medications but he wants you to call him in something to be intimate with his wife because this is causing him more stress in his life not being able to be intimate with his wife, he says he just needs 3-4 pills for a month to be with his wife. He said his BP is doing good and doesn't want to mess that up, but was very adamant he needs just 3-4 pills to help him to be intimate with his wife.

## 2019-04-25 NOTE — Telephone Encounter (Signed)
The issue is if he started the new medicines at the same time is hard to know which one is causing the problem.  He has been on Lexapro in the past with no problems so I would have to think it could be the Toprol.  If he agrees then continue the quinapril but lets add a different blood pressure medication called amlodipine to help control his blood pressure along with the quinapril.  See if agreeable  The clonazepam is as needed not daily.  If he does not agree and thinks the Lexapro is the issue then we will go a different direction and I will give other instructions.  Let me know

## 2019-04-25 NOTE — Telephone Encounter (Signed)
I sent Viagra to the pharmacy.  He can use 1/2 to 1 tablet as needed for intimacy.  Take the medicine about 30 minutes prior to intimacy.  No more than 1 pill maximum in 24 hours.  If now in the future if he takes nitroglycerin for heart related issues, this cannot be taken within the same 24-hour period with Viagra or similar medicine due to potential life-threatening consequences.

## 2019-05-27 ENCOUNTER — Ambulatory Visit: Payer: 59 | Admitting: Medical

## 2019-07-05 ENCOUNTER — Other Ambulatory Visit: Payer: Self-pay | Admitting: Medical

## 2019-07-05 ENCOUNTER — Other Ambulatory Visit: Payer: Self-pay | Admitting: Family Medicine

## 2019-07-05 MED ORDER — METOPROLOL SUCCINATE ER 25 MG PO TB24: 25.0000 mg | ORAL_TABLET | Freq: Every day | ORAL | 0 refills | Status: DC

## 2019-07-05 MED ORDER — QUINAPRIL HCL 20 MG PO TABS: ORAL_TABLET | ORAL | 0 refills | Status: DC

## 2019-07-07 ENCOUNTER — Telehealth: Payer: Self-pay

## 2019-07-07 MED ORDER — ESCITALOPRAM OXALATE 10 MG PO TABS: 10.0000 mg | ORAL_TABLET | Freq: Every day | ORAL | 2 refills | Status: DC

## 2019-07-07 NOTE — Telephone Encounter (Signed)
Called pt to advise of the need or a appt. Pt advised that he will be out of his lexapro before his next appt with Audelia Acton next week. Please advise if we can get pt a script to last him until his appt next week . Thank you. [place]

## 2019-07-15 ENCOUNTER — Other Ambulatory Visit: Payer: Self-pay

## 2019-07-15 ENCOUNTER — Encounter: Payer: Self-pay | Admitting: Medical

## 2019-07-15 ENCOUNTER — Ambulatory Visit: Payer: 59 | Admitting: Medical

## 2019-07-15 VITALS — BP 132/84 | HR 58 | Temp 98.0°F | Ht 72.0 in | Wt 197.2 lb

## 2019-07-15 DIAGNOSIS — I10 Essential (primary) hypertension: Secondary | ICD-10-CM

## 2019-07-15 DIAGNOSIS — R42 Dizziness and giddiness: Secondary | ICD-10-CM

## 2019-07-15 DIAGNOSIS — F419 Anxiety disorder, unspecified: Secondary | ICD-10-CM | POA: Diagnosis not present

## 2019-07-15 DIAGNOSIS — E785 Hyperlipidemia, unspecified: Secondary | ICD-10-CM

## 2019-07-15 MED ORDER — QUINAPRIL HCL 20 MG PO TABS: 10.0000 mg | ORAL_TABLET | Freq: Every day | ORAL | 0 refills | Status: DC

## 2019-07-15 NOTE — Progress Notes (Signed)
Subjective: Chief Complaint  Patient presents with  . Follow-up    blood pressure    Here for f/u.   I saw him back in December.  At that time he was feeling stressed., BP was up here and at dentist.   He was dealing with family stress as well.  He says he has a "game plan" with lifestyle to get weight down, to reduce stress.  Takes a multivitamin daily, has cut back on salt, is exercising, working on muscle toning as well.   He is taking Quinapril still, and is taking Toprol added last visit.  He is also taking Lexapro started in 03/2019 as well.    He is getting some dizziness when he stands and he notes at least a 10 point systolic drop when lying down since changing his exercise and starting Toprol.   Recently had eye exam with eye doctor in [place], [place].  No glaucoma, but had some age related lens changes, prescription was changed a bit.    Anxiety much improved on Lexapro and Toprol.  Likes the way this combination makes him feel.      Past Medical History:  Diagnosis Date  . Anxiety   . Chest pain 2006   hospital eval  . GERD (gastroesophageal reflux disease)   . History of cardiovascular stress test 2006   [place]  . Hypertension    Current Outpatient Medications on File Prior to Visit  Medication Sig Dispense Refill  . escitalopram (LEXAPRO) 10 MG tablet Take 1 tablet (10 mg total) by mouth daily. 30 tablet 2  . metoprolol succinate (TOPROL-XL) 25 MG 24 hr tablet Take 1 tablet (25 mg total) by mouth daily. 30 tablet 0  . Multiple Vitamin (MULTIVITAMIN) capsule Take 1 capsule by mouth daily.    . Omega-3 Fatty Acids (FISH OIL PO) Take by mouth.    . sildenafil (VIAGRA) 50 MG tablet Take 1 tablet (50 mg total) by mouth as needed for erectile dysfunction. (Patient not taking: Reported on 07/15/2019) 10 tablet 1   No current facility-administered medications on file prior to visit.   ROS as in subjective   Objective: BP 132/84   Pulse (!) 58   Temp 98 F (36.7 C)    Ht 6' (1.829 m)   Wt 197 lb 3.2 oz (89.4 kg)   SpO2 98%   BMI 26.75 kg/m   General appearence: alert, no distress, WD/WN,  Heart: RRR, normal S1, S2, no murmurs Lungs: CTA bilaterally, no wheezes, rhonchi, or rales Pulses: 2+ symmetric, upper and lower extremities, normal cap refill Ext: no edema     Assessment: Encounter Diagnoses  Name Primary?  . Essential hypertension Yes  . Anxiety   . Dyslipidemia   . Dizziness      Plan: Discussed his concerns.  Congratulated him on his BP improvements and lifestyle changes.  Patient Instructions  Blood pressure goal is 120/70.    Continue to monitor your pressures.   Check blood pressure 3 days per week.    Continue Toprol XL 25mg  daily  Cut the Quinapril 20mg  in half and use 1/2 tablet daily or 10mg  daily. Increase water intake, preferably around 2 liter per day Continue your exercise program  If you are seeing blood pressure numbers 105/60 or lower, or if >130/80 regularly, then call back in case we need to adjust the medications    High cholesterol: Current guidelines recommend a statin medication such as Crestor to lower your risk of heart  disease.       Oisin was seen today for follow-up.  Diagnoses and all orders for this visit:  Essential hypertension  Anxiety  Dyslipidemia  Dizziness  Other orders -     quinapril (ACCUPRIL) 20 MG tablet; Take 0.5 tablets (10 mg total) by mouth daily. 1 TAB DAILY   F/u 3mo for fasting physical, lipid screening

## 2019-07-15 NOTE — Patient Instructions (Addendum)
Blood pressure goal is 120/70.    Continue to monitor your pressures.   Check blood pressure 3 days per week.    Continue Toprol XL 25mg  daily  Cut the Quinapril 20mg  in half and use 1/2 tablet daily or 10mg  daily. Increase water intake, preferably around 2 liter per day Continue your exercise program  If you are seeing blood pressure numbers 105/60 or lower, or if >130/80 regularly, then call back in case we need to adjust the medications    High cholesterol: Current guidelines recommend a statin medication such as Crestor to lower your risk of heart disease.

## 2019-08-04 ENCOUNTER — Other Ambulatory Visit: Payer: Self-pay | Admitting: Family Medicine

## 2019-09-05 ENCOUNTER — Other Ambulatory Visit: Payer: Self-pay | Admitting: Medical

## 2019-09-05 NOTE — Telephone Encounter (Signed)
Pt called and said he needs this medication refilled today before he goes out of town. Pt uses [facility] on [address]

## 2019-10-10 ENCOUNTER — Other Ambulatory Visit: Payer: Self-pay | Admitting: Family Medicine

## 2019-10-10 ENCOUNTER — Telehealth: Payer: Self-pay | Admitting: Medical

## 2019-10-10 ENCOUNTER — Other Ambulatory Visit: Payer: Self-pay | Admitting: Medical

## 2019-10-10 NOTE — Telephone Encounter (Signed)
Pt called to check on status of refills. He needs Lexapro and Toprol XR sent to [facility]. Pt is completely out.

## 2019-10-10 NOTE — Telephone Encounter (Signed)
Refills were done already today

## 2019-10-14 NOTE — Telephone Encounter (Signed)
Get them on schedule for physical /fasting lab visit

## 2019-10-14 NOTE — Telephone Encounter (Signed)
Called and left pt a VM to scheduled CPE

## 2019-12-22 ENCOUNTER — Other Ambulatory Visit: Payer: Self-pay

## 2019-12-22 ENCOUNTER — Telehealth: Payer: Self-pay | Admitting: Medical

## 2019-12-22 MED ORDER — METOPROLOL SUCCINATE ER 25 MG PO TB24: 25.0000 mg | ORAL_TABLET | Freq: Every day | ORAL | 1 refills | Status: DC

## 2019-12-22 NOTE — Telephone Encounter (Signed)
Medication has been sent.  

## 2019-12-22 NOTE — Telephone Encounter (Signed)
Pt called and needs  A refill on his  Metoprolol please send to [facility] [place] [address]  Pt has a cpe scededuled fpr October the 15th

## 2020-01-02 ENCOUNTER — Other Ambulatory Visit: Payer: Self-pay | Admitting: Medical

## 2020-01-02 ENCOUNTER — Other Ambulatory Visit: Payer: Self-pay | Admitting: Family Medicine

## 2020-01-23 ENCOUNTER — Other Ambulatory Visit: Payer: Self-pay

## 2020-01-23 ENCOUNTER — Encounter: Payer: Self-pay | Admitting: Medical

## 2020-01-23 ENCOUNTER — Ambulatory Visit: Payer: 59 | Admitting: Medical

## 2020-01-23 VITALS — BP 122/88 | HR 60 | Ht 72.0 in | Wt 203.0 lb

## 2020-01-23 DIAGNOSIS — K219 Gastro-esophageal reflux disease without esophagitis: Secondary | ICD-10-CM

## 2020-01-23 DIAGNOSIS — Z125 Encounter for screening for malignant neoplasm of prostate: Secondary | ICD-10-CM

## 2020-01-23 DIAGNOSIS — E785 Hyperlipidemia, unspecified: Secondary | ICD-10-CM

## 2020-01-23 DIAGNOSIS — N529 Male erectile dysfunction, unspecified: Secondary | ICD-10-CM

## 2020-01-23 DIAGNOSIS — Z1211 Encounter for screening for malignant neoplasm of colon: Secondary | ICD-10-CM

## 2020-01-23 DIAGNOSIS — Z2820 Immunization not carried out because of patient decision for unspecified reason: Secondary | ICD-10-CM | POA: Insufficient documentation

## 2020-01-23 DIAGNOSIS — Z0000 Encounter for general adult medical examination without abnormal findings: Secondary | ICD-10-CM | POA: Diagnosis not present

## 2020-01-23 DIAGNOSIS — I10 Essential (primary) hypertension: Secondary | ICD-10-CM | POA: Diagnosis not present

## 2020-01-23 DIAGNOSIS — R7301 Impaired fasting glucose: Secondary | ICD-10-CM | POA: Diagnosis not present

## 2020-01-23 DIAGNOSIS — F419 Anxiety disorder, unspecified: Secondary | ICD-10-CM

## 2020-01-23 DIAGNOSIS — Z566 Other physical and mental strain related to work: Secondary | ICD-10-CM

## 2020-01-23 DIAGNOSIS — Z7185 Encounter for immunization safety counseling: Secondary | ICD-10-CM

## 2020-01-23 MED ORDER — ESCITALOPRAM OXALATE 10 MG PO TABS: 10.0000 mg | ORAL_TABLET | Freq: Every day | ORAL | 3 refills | Status: DC

## 2020-01-23 MED ORDER — CLONAZEPAM 0.5 MG PO TABS: 0.5000 mg | ORAL_TABLET | Freq: Every evening | ORAL | 0 refills | Status: DC | PRN

## 2020-01-23 MED ORDER — QUINAPRIL HCL 20 MG PO TABS: 20.0000 mg | ORAL_TABLET | Freq: Every day | ORAL | 3 refills | Status: DC

## 2020-01-23 MED ORDER — SILDENAFIL CITRATE 50 MG PO TABS: 50.0000 mg | ORAL_TABLET | ORAL | 5 refills | Status: DC | PRN

## 2020-01-23 MED ORDER — METOPROLOL SUCCINATE ER 25 MG PO TB24: 25.0000 mg | ORAL_TABLET | Freq: Every day | ORAL | 3 refills | Status: DC

## 2020-01-23 NOTE — Progress Notes (Signed)
Complete physical exam   Patient: Omar Phillips   DOB: May 17, 1965   54 y.o. Male  MRN: [id number] Visit Date: 01/23/2020  Today's healthcare provider: Dorothea Ogle, PA-C   Chief Complaint  Patient presents with  . Annual Exam    with fasting labs    Subjective    Carry Kaidin Boehle is a 54 y.o. male who presents today for a complete physical exam.  He reports consuming a general diet. Home exercise routine includes walking 2-3 times a week . He generally feels well. He reports sleeping well. He does not have additional problems to discuss today.  HPI HPI    Annual Exam     Additional comments: with fasting labs        Last edited by Edgar Frisk, [place] on 01/23/2020  9:53 AM. (History)      Doing well, compliant with medication, feels good  Would like refill on ED medication and clonazepam which he uses periodically for really stressful moments.   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 Anxiety   . Chest pain 2006   hospital eval  . GERD (gastroesophageal reflux disease)   . History of cardiovascular stress test 2006   [place]  . Hypertension    Past Surgical History:  Procedure Laterality Date  . LACERATION REPAIR     left forearm, remote past  . TESTICLE TORSION REDUCTION     teenage years    Family Status  Relation Name Status  . Mother  Alive  . Sister  Alive  . Father  Alive  . Brother  Alive  . Mat Aunt  (Not Specified)  . Mat Uncle  (Not Specified)  . Brother  (Not Specified)  . Brother  (Not Specified)  . Neg Hx  (Not Specified)   Family History  Problem Relation Age of Onset  . Hypertension Mother   . Hypothyroidism Mother   . Diabetes Mother   . Hypertension Sister   . Other Father        history unknown  . Hypertension Brother   . Diabetes Brother   . Cancer Maternal Aunt        breast  . Stroke Maternal Uncle   . Hypertension Brother   .  Hypertension Brother   . Heart disease Neg Hx    Allergies  Allergen Reactions  . Lipitor [Atorvastatin]     Shakes, didn't feel good on it    Patient Care Team: [place] Maulden, Camelia Eng, PA-C as PCP - General (Family Medicine)   Medications: Outpatient Medications Prior to Visit  Medication Sig  . Multiple Vitamin (MULTIVITAMIN) capsule Take 1 capsule by mouth daily.  . Omega-3 Fatty Acids (FISH OIL PO) Take by mouth.  . [DISCONTINUED] escitalopram (LEXAPRO) 10 MG tablet Take 1 tablet by mouth once daily  . [DISCONTINUED] metoprolol succinate (TOPROL-XL) 25 MG 24 hr tablet Take 1 tablet (25 mg total) by mouth daily.  . [DISCONTINUED] quinapril (ACCUPRIL) 20 MG tablet Take 1 tablet by mouth once daily  . [DISCONTINUED] sildenafil (VIAGRA) 50 MG tablet Take 1 tablet (50 mg total) by mouth as needed for erectile dysfunction. (Patient not taking: Reported on 07/15/2019)   No facility-administered medications prior to visit.       Review of Systems Review of Systems Constitutional: -fever, -chills, -sweats, -unexpected weight change, -anorexia, -fatigue Allergy: -sneezing, -itching, -congestion Dermatology: denies changing moles, rash, lumps, new worrisome lesions  ENT: -runny nose, -ear pain, -sore throat, -hoarseness, -sinus pain, -teeth pain, -tinnitus, -hearing loss, -epistaxis Cardiology:  -chest pain, -palpitations, -edema, -orthopnea, -paroxysmal nocturnal dyspnea Respiratory: -cough, -shortness of breath, -dyspnea on exertion, -wheezing, -hemoptysis Gastroenterology: -abdominal pain, -nausea, -vomiting, -diarrhea, -constipation, -blood in stool, -changes in bowel movement, -dysphagia Hematology: -bleeding or bruising problems Musculoskeletal: -arthralgias, -myalgias, -joint swelling, -back pain, -neck pain, -cramping, -gait changes Ophthalmology: -vision changes, -eye redness, -itching, -discharge Urology: -dysuria, -difficulty urinating, -hematuria, -urinary frequency, -urgency,  incontinence Neurology: -headache, -weakness, -tingling, -numbness, -speech abnormality, -memory loss, -falls, -dizziness Psychology:  -depressed mood, -agitation, -sleep problems    Objective    BP 122/88   Pulse 60   Ht 6' (1.829 m)   Wt 203 lb (92.1 kg)   SpO2 96%   BMI 27.53 kg/m    Physical Exam  General appearence: alert, no distress, WD/WN, African American male HEENT: normocephalic, sclerae anicteric, PERRLA, EOMi, nares patent, no discharge or erythema, pharynx normal Oral cavity: MMM, no lesions Neck: supple, no lymphadenopathy, no thyromegaly, no masses, no bruits Heart: RRR, normal S1, S2, no murmurs Lungs: CTA bilaterally, no wheezes, rhonchi, or rales Abdomen: +bs, soft, non tender, non distended, no masses, no hepatomegaly, no splenomegaly Back: non tender Musculoskeletal: nontender, no swelling, no obvious deformity Extremities: no edema, no cyanosis, no clubbing Pulses: 2+ symmetric, upper and lower extremities, normal cap refill Neurological: alert, oriented x 3, CN2-12 intact, strength normal upper extremities and lower extremities, sensation normal throughout, DTRs 2+ throughout, no cerebellar signs, gait normal Psychiatric: normal affect, behavior normal, pleasant  GU normal male, no mass or hernia or LAD Rectal: somewhat lax tone, no nodules, prostate WNL    Last depression screening scores PHQ 2/9 Scores 01/23/2020 08/26/2018 01/08/2017  PHQ - 2 Score 0 0 0  PHQ- 9 Score 0 - -   Last fall risk screening Fall Risk  01/08/2017  Falls in the past year? No      Assessment & Plan   Encounter Diagnoses  Name Primary?  . Encounter for health maintenance examination in adult Yes  . Essential hypertension   . Impaired fasting glucose   . Gastroesophageal reflux disease without esophagitis   . Anxiety   . Dyslipidemia   . Screening for prostate cancer   . Erectile dysfunction, unspecified erectile dysfunction type   . Vaccine counseling   . Work  stress   . Screen for colon cancer   . Vaccine refused by patient       Routine Health Maintenance and Physical Exam  Exercise Activities and Dietary recommendations Goals   None     Immunization History  Administered Date(s) Administered  . Td 12/07/2006  . Tdap 11/29/2009, 01/08/2017    Health Maintenance  Topic Date Due  . Hepatitis C Screening  Never done  . COLONOSCOPY  Never done  . INFLUENZA VACCINE  Never done  . TETANUS/TDAP  01/09/2027  . HIV Screening  Completed   Physical exam - discussed and counseled on healthy lifestyle, diet, exercise, preventative care, vaccinations, sick and well care, proper use of emergency dept and after hours care, and addressed their concerns.    Health screening: See your eye doctor yearly for routine vision care. See your dentist yearly for routine dental care including hygiene visits twice yearly.  Cancer screening Colonoscopy:  Referred for screening colonoscopy  Discussed PSA, prostate exam, and prostate cancer screening risks/benefits.      Vaccinations: Advised yearly influenza vaccine, covid vaccine, Shingrix, but he declines  all  He is up to date on Td vaccine    Acute issues discussed: none  Separate significant chronic issues discussed: HTN - doing well on current regimen  Anxiety - stable, doing well, high stress emotional job  Impaired fasting glucose and prior hyperlipidemia - labs today, not currently on medication.  He has made lifestyle changes though   Rayford was seen today for annual exam.  Diagnoses and all orders for this visit:  Encounter for health maintenance examination in adult -     PSA -     CBC with Differential/Platelet -     Comprehensive metabolic panel -     Hemoglobin A1c -     Lipid panel  Essential hypertension  Impaired fasting glucose -     Hemoglobin A1c  Gastroesophageal reflux disease without esophagitis  Anxiety  Dyslipidemia -     Lipid panel  Screening  for prostate cancer -     PSA  Erectile dysfunction, unspecified erectile dysfunction type  Vaccine counseling  Work stress  Screen for colon cancer -     Ambulatory referral to Gastroenterology  Vaccine refused by patient  Other orders -     escitalopram (LEXAPRO) 10 MG tablet; Take 1 tablet (10 mg total) by mouth daily. -     metoprolol succinate (TOPROL-XL) 25 MG 24 hr tablet; Take 1 tablet (25 mg total) by mouth daily. -     quinapril (ACCUPRIL) 20 MG tablet; Take 1 tablet (20 mg total) by mouth daily. -     sildenafil (VIAGRA) 50 MG tablet; Take 1 tablet (50 mg total) by mouth as needed for erectile dysfunction. -     clonazePAM (KLONOPIN) 0.5 MG tablet; Take 1 tablet (0.5 mg total) by mouth at bedtime as needed for anxiety.    Follow-up pending labs, yearly for physical

## 2020-01-24 LAB — COMPREHENSIVE METABOLIC PANEL
ALT: 24 IU/L (ref 0–44)
AST: 25 IU/L (ref 0–40)
Albumin/Globulin Ratio: 1.7 (ref 1.2–2.2)
Albumin: 4.9 g/dL (ref 3.8–4.9)
Alkaline Phosphatase: 72 IU/L (ref 44–121)
BUN/Creatinine Ratio: 10 (ref 9–20)
BUN: 12 mg/dL (ref 6–24)
Bilirubin Total: 0.6 mg/dL (ref 0.0–1.2)
CO2: 25 mmol/L (ref 20–29)
Calcium: 9.9 mg/dL (ref 8.7–10.2)
Chloride: 99 mmol/L (ref 96–106)
Creatinine, Ser: 1.21 mg/dL (ref 0.76–1.27)
GFR calc Af Amer: 79 mL/min/{1.73_m2} (ref >59)
GFR calc non Af Amer: 68 mL/min/{1.73_m2} (ref >59)
Globulin, Total: 2.9 g/dL (ref 1.5–4.5)
Glucose: 133 mg/dL — ABNORMAL HIGH (ref 65–99)
Potassium: 4.9 mmol/L (ref 3.5–5.2)
Sodium: 137 mmol/L (ref 134–144)
Total Protein: 7.8 g/dL (ref 6.0–8.5)

## 2020-01-24 LAB — CBC WITH DIFFERENTIAL/PLATELET
Basophils Absolute: 0 10*3/uL (ref 0.0–0.2)
Basos: 1 %
EOS (ABSOLUTE): 0.1 10*3/uL (ref 0.0–0.4)
Eos: 1 %
Hematocrit: 46.6 % (ref 37.5–51.0)
Hemoglobin: 15.8 g/dL (ref 13.0–17.7)
Immature Grans (Abs): 0 10*3/uL (ref 0.0–0.1)
Immature Granulocytes: 0 %
Lymphocytes Absolute: 2.2 10*3/uL (ref 0.7–3.1)
Lymphs: 49 %
MCH: 30.9 pg (ref 26.6–33.0)
MCHC: 33.9 g/dL (ref 31.5–35.7)
MCV: 91 fL (ref 79–97)
Monocytes Absolute: 0.4 10*3/uL (ref 0.1–0.9)
Monocytes: 9 %
Neutrophils Absolute: 1.8 10*3/uL (ref 1.4–7.0)
Neutrophils: 40 %
Platelets: 312 10*3/uL (ref 150–450)
RBC: 5.12 x10E6/uL (ref 4.14–5.80)
RDW: 12.5 % (ref 11.6–15.4)
WBC: 4.5 10*3/uL (ref 3.4–10.8)

## 2020-01-24 LAB — HEMOGLOBIN A1C
Est. average glucose Bld gHb Est-mCnc: 151 mg/dL
Hgb A1c MFr Bld: 6.9 % — ABNORMAL HIGH (ref 4.8–5.6)

## 2020-01-24 LAB — LIPID PANEL
Chol/HDL Ratio: 6.7 ratio — ABNORMAL HIGH (ref 0.0–5.0)
Cholesterol, Total: 293 mg/dL — ABNORMAL HIGH (ref 100–199)
HDL: 44 mg/dL (ref >39)
LDL Chol Calc (NIH): 192 mg/dL — ABNORMAL HIGH (ref 0–99)
Triglycerides: 289 mg/dL — ABNORMAL HIGH (ref 0–149)
VLDL Cholesterol Cal: 57 mg/dL — ABNORMAL HIGH (ref 5–40)

## 2020-01-24 LAB — PSA: Prostate Specific Ag, Serum: 1.7 ng/mL (ref 0.0–4.0)

## 2020-01-26 MED ORDER — SILDENAFIL CITRATE 50 MG PO TABS: 50.0000 mg | ORAL_TABLET | ORAL | 5 refills | Status: DC | PRN

## 2020-01-26 MED ORDER — QUINAPRIL HCL 20 MG PO TABS: 20.0000 mg | ORAL_TABLET | Freq: Every day | ORAL | 3 refills | Status: DC

## 2020-01-26 MED ORDER — METOPROLOL SUCCINATE ER 25 MG PO TB24: 25.0000 mg | ORAL_TABLET | Freq: Every day | ORAL | 3 refills | Status: DC

## 2020-01-26 MED ORDER — CLONAZEPAM 0.5 MG PO TABS: 0.5000 mg | ORAL_TABLET | Freq: Every evening | ORAL | 0 refills | Status: DC | PRN

## 2020-01-26 MED ORDER — ESCITALOPRAM OXALATE 10 MG PO TABS: 10.0000 mg | ORAL_TABLET | Freq: Every day | ORAL | 3 refills | Status: DC

## 2020-01-26 NOTE — Addendum Note (Signed)
Addended by: Carlena Hurl on: 01/26/2020 01:39 PM   Modules accepted: Orders

## 2020-01-26 NOTE — Addendum Note (Signed)
Addended by: Edgar Frisk on: 01/26/2020 11:05 AM   Modules accepted: Orders

## 2020-01-26 NOTE — Addendum Note (Signed)
Addended by: Carlena Hurl on: 01/26/2020 01:38 PM   Modules accepted: Orders

## 2020-06-08 HISTORY — PX: COLONOSCOPY: SHX174

## 2020-06-15 ENCOUNTER — Telehealth: Payer: Self-pay | Admitting: Medical

## 2020-06-15 ENCOUNTER — Other Ambulatory Visit: Payer: Self-pay

## 2020-06-15 DIAGNOSIS — I10 Essential (primary) hypertension: Secondary | ICD-10-CM

## 2020-06-15 DIAGNOSIS — R9431 Abnormal electrocardiogram [ECG] [EKG]: Secondary | ICD-10-CM

## 2020-06-15 LAB — HM COLONOSCOPY

## 2020-06-15 NOTE — Telephone Encounter (Signed)
Referral sent 

## 2020-06-15 NOTE — Telephone Encounter (Signed)
I spoke with Dr. Benson Norway who saw him recently   He was concerned about his EKG.  Given his hx/o hypertension and given EKG findings, lets refer to cardiology for baseline consult.

## 2020-06-17 ENCOUNTER — Encounter: Payer: Self-pay | Admitting: Medical

## 2020-06-28 NOTE — Progress Notes (Signed)
Date:  06/29/2020   ID:  Omar Omar, DOB Jul 13, 1965, MRN [id number]  PCP:  Carlena Hurl, PA-C  Cardiologist: Rex Kras, DO, [facility] (established care 06/29/2020) Former Cardiology Providers: Dr. Minus Breeding.   REASON FOR CONSULT: Essential hypertension, Abnormal EKG  REQUESTING PHYSICIAN:  Carlena Hurl, PA-C [address] [place],  [place] [address]  Chief Complaint  Patient presents with  . Hypertension  . Abnormal ECG  . New Patient (Initial Visit)    HPI  Omar Omar is a 55 y.o. African-American male who presents to the office with a chief complaint of "hypertension and abnormal EKG." Patient's past medical history and cardiovascular risk factors include: hypertension, hypercholesterolemia, GERD, erectile dysfunction.  He is referred to the office at the request of Carlena Hurl, PA-C for evaluation of hypertension and abnormal EKG.  Patient states that he was recently scheduled for a colonoscopy and was noted to have an abnormal EKG in the preprocedure area.  His past records were reviewed and therefore patient did undergo a colonoscopy successfully and then referred to cardiology now for evaluation of an abnormal EKG and blood pressure management.  Patient states that he is always had a "an abnormal EKG."  For which she has already undergone a left heart catheterization back in 2006 results reviewed and noted below for further reference.  With regards to blood pressure management: Patient states that his home blood pressures range between 032-122 mmHg systolic and diastolic ranges between 48-25 mmHg.  Patient is conscientious of his salt intake.  He does have stress at work which may be a contributing factor as well.  He is currently on quinapril which he takes regularly.  FUNCTIONAL STATUS: No cardiovascular activity but focuses on resistant training. In the past use to walk 30 minutes a day.    ALLERGIES: Allergies  Allergen Reactions  .  Lipitor [Atorvastatin]     Shakes, didn't feel good on it    MEDICATION LIST PRIOR TO VISIT: Current Meds  Medication Sig  . metoprolol tartrate (LOPRESSOR) 25 MG tablet Take 1 tablet (25 mg total) by mouth 2 (two) times daily for 15 days.  . Multiple Vitamin (MULTIVITAMIN) capsule Take 1 capsule by mouth daily.  . Omega-3 Fatty Acids (FISH OIL PO) Take by mouth.  . quinapril (ACCUPRIL) 20 MG tablet Take 1 tablet (20 mg total) by mouth daily.     PAST MEDICAL HISTORY: Past Medical History:  Diagnosis Date  . Anxiety   . Chest pain 2006   hospital eval  . Dyslipidemia   . GERD (gastroesophageal reflux disease)   . History of cardiovascular stress test 2006   [place]  . Hypertension     PAST SURGICAL HISTORY: Past Surgical History:  Procedure Laterality Date  . LACERATION REPAIR     left forearm, remote past  . TESTICLE TORSION REDUCTION     teenage years    FAMILY HISTORY: The patient family history includes Cancer in his maternal aunt; Diabetes in his brother and mother; Hypertension in his brother, brother, brother, mother, and sister; Hypothyroidism in his mother; Other in his father; Stroke in his maternal uncle.  SOCIAL HISTORY:  The patient  reports that he has never smoked. He has never used smokeless tobacco. He reports that he does not drink alcohol and does not use drugs.  REVIEW OF SYSTEMS: Review of Systems  Constitutional: Negative for chills and fever.  HENT: Negative for hoarse voice and nosebleeds.   Eyes: Negative for  discharge, double vision and pain.  Cardiovascular: Negative for chest pain, claudication, dyspnea on exertion, leg swelling, near-syncope, orthopnea, palpitations, paroxysmal nocturnal dyspnea and syncope.  Respiratory: Negative for hemoptysis and shortness of breath.   Musculoskeletal: Negative for muscle cramps and myalgias.  Gastrointestinal: Negative for abdominal pain, constipation, diarrhea, hematemesis, hematochezia, melena,  nausea and vomiting.  Neurological: Negative for dizziness and light-headedness.    PHYSICAL EXAM: Vitals with BMI 06/29/2020 06/29/2020 01/23/2020  Height - _0  _1   Weight - 203 lbs 203 lbs  BMI - 41.74 08.14  Systolic 481 856 314  Diastolic 80 97 88  Pulse 67 66 60    CONSTITUTIONAL: Well-developed and well-nourished. No acute distress.  SKIN: Skin is warm and dry. No rash noted. No cyanosis. No pallor. No jaundice HEAD: Normocephalic and atraumatic.  EYES: No scleral icterus MOUTH/THROAT: Moist oral membranes.  NECK: No JVD present. No thyromegaly noted. No carotid bruits  LYMPHATIC: No visible cervical adenopathy.  CHEST Normal respiratory effort. No intercostal retractions  LUNGS: Clear to auscultation bilaterally.  No stridor. No wheezes. No rales.  CARDIOVASCULAR: Regular rate and rhythm, positive H7-W2, soft systolic ejection murmur, no gallops or rubs ABDOMINAL: No apparent ascites.  EXTREMITIES: No peripheral edema  HEMATOLOGIC: No significant bruising NEUROLOGIC: Oriented to person, place, and time. Nonfocal. Normal muscle tone.  PSYCHIATRIC: Normal mood and affect. Normal behavior. Cooperative  CARDIAC DATABASE: EKG:  06/29/2020: Normal sinus rhythm, 66 bpm, normal axis, diffuse T wave inversions in the anterior, anterolateral, and inferior leads suggestive of ischemia, without underlying injury pattern.  No significant change compared to prior dated 08/27/2018.  Echocardiogram: No results found for this or any previous visit from the past 1095 days.   Stress Testing: No results found for this or any previous visit from the past 1095 days.  Heart Catheterization: 04/06/2005:   Coronaries:  The left main was normal.  The LAD was large wrapping the apex, it was normal throughout its course.  The first and second diagonal were  small to moderate size and normal.   The circumflex in the AV groove had scattered luminal irregularities.  The first obtuse marginal was  small and  normal.  The second obtuse marginal was small and normal.  The third obtuse  marginal was very large and normal.   The right coronary artery was a very large dominant vessel.  There were diffuse mild luminal irregularities.  The  PDA was moderate size and normal.   Left ventriculogram:  The left ventriculogram was obtained in the RAO projection.  The EF was 60% with normal wall motion.   CONCLUSION:  Mild coronary plaque.  Well preserved ejection fraction.  LABORATORY DATA: CBC Latest Ref Rng & Units 01/23/2020 08/26/2018 10/20/2015  WBC 3.4 - 10.8 x10E3/uL 4.5 4.6 4.4  Hemoglobin 13.0 - 17.7 g/dL 15.8 15.2 14.9  Hematocrit 37.5 - 51.0 % 46.6 43.6 45.3  Platelets 150 - 450 x10E3/uL 312 398 335    CMP Latest Ref Rng & Units 01/23/2020 08/26/2018 01/08/2017  Glucose 65 - 99 mg/dL 133(H) 106(H) 148(H)  BUN 6 - 24 mg/dL _2 Creatinine 0.76 - 1.27 mg/dL 1.21 1.22 1.22  Sodium 134 - 144 mmol/L 137 138 138  Potassium 3.5 - 5.2 mmol/L 4.9 4.8 4.1  Chloride 96 - 106 mmol/L 99 100 101  CO2 20 - 29 mmol/L _3 Calcium 8.7 - 10.2 mg/dL 9.9 9.7 9.7  Total Protein 6.0 - 8.5 g/dL 7.8 7.6 7.5  Total Bilirubin 0.0 - 1.2 mg/dL 0.6 0.4 0.6  Alkaline Phos 44 - 121 IU/L 72 82 -  AST 0 - 40 IU/L 25 30 33  ALT 0 - 44 IU/L 24 29 42    Lipid Panel  Lab Results  Component Value Date   CHOL 293 (H) 01/23/2020   HDL 44 01/23/2020   LDLCALC 192 (H) 01/23/2020   LDLDIRECT 178.2 12/07/2006   TRIG 289 (H) 01/23/2020   CHOLHDL 6.7 (H) 01/23/2020   No components found for: NTPROBNP No results for input(s): PROBNP in the last 8760 hours. No results for input(s): TSH in the last 8760 hours.  BMP Recent Labs    01/23/20 1020  NA 137  K 4.9  CL 99  CO2 25  GLUCOSE 133*  BUN 12  CREATININE 1.21  CALCIUM 9.9  GFRNONAA 68  GFRAA 79    HEMOGLOBIN A1C Lab Results  Component Value Date   HGBA1C 6.9 (H) 01/23/2020   MPG 140 01/08/2017    IMPRESSION:    ICD-10-CM   1.  Essential hypertension  I10 CMP14+EGFR    LDL cholesterol, direct    Lipid Panel With LDL/HDL Ratio  2. Nonspecific abnormal electrocardiogram (ECG) (EKG)  R94.31 EKG 12-Lead    CT CORONARY MORPH W/CTA COR W/SCORE W/CA W/CM &/OR WO/CM    CT CORONARY FRACTIONAL FLOW RESERVE DATA PREP    CT CORONARY FRACTIONAL FLOW RESERVE FLUID ANALYSIS    metoprolol tartrate (LOPRESSOR) 25 MG tablet  3. Cardiac murmur  R01.1 PCV ECHOCARDIOGRAM COMPLETE  4. Hypercholesteremia  E78.00      RECOMMENDATIONS: Omar Omar is a 55 y.o. male whose past medical history and cardiac risk factors include: hypertension, hypercholesterolemia, GERD, erectile dysfunction.  Patient is referred to the office for further evaluation of an abnormal EKG.  He is noted to have normal sinus rhythm with diffuse T wave inversions as noted above.  Patient has had similar EKG findings in the past and back in 2016 he also has undergone left heart catheterization results were obtained and reviewed during today's office visit and summarized above.  Given the EKG findings and multiple cardiovascular risk factors that shared decision was to proceed with coronary CTA to further evaluate for coronary artery calcification and obstructive CAD.  As his last ischemic evaluation was back in 2006.  Check a BMP prior to coronary CTA.  We will start Lopressor 25 mg p.o. twice daily for 1 week leading up to the CT scan.  With regards to blood pressure management patient states that his home blood pressures do improve with increasing physical activity and decreasing stress.  Continue current medical therapy for now and patient is asked to keep a log of his blood pressures and to bring it in at the next office visit to see if additional medication is warranted.  Plan echo to evaluate his cardiac murmur noted on physical examination.  His most recent lipid profile from October 2021 notes elevated cholesterol levels.  He has had multiple cholesterol  readings in the past where the LDL is greater than 100 mg/dL.  Recommend fasting lipid profile for reevaluation.  FINAL MEDICATION LIST END OF ENCOUNTER: Meds ordered this encounter  Medications  . metoprolol tartrate (LOPRESSOR) 25 MG tablet    Sig: Take 1 tablet (25 mg total) by mouth 2 (two) times daily for 15 days.    Dispense:  30 tablet    Refill:  0    Medications Discontinued During This Encounter  Medication  Reason  . sildenafil (VIAGRA) 50 MG tablet Error  . metoprolol succinate (TOPROL-XL) 25 MG 24 hr tablet Error  . escitalopram (LEXAPRO) 10 MG tablet Error  . clonazePAM (KLONOPIN) 0.5 MG tablet Error     Current Outpatient Medications:  .  metoprolol tartrate (LOPRESSOR) 25 MG tablet, Take 1 tablet (25 mg total) by mouth 2 (two) times daily for 15 days., Disp: 30 tablet, Rfl: 0 .  Multiple Vitamin (MULTIVITAMIN) capsule, Take 1 capsule by mouth daily., Disp: , Rfl:  .  Omega-3 Fatty Acids (FISH OIL PO), Take by mouth., Disp: , Rfl:  .  quinapril (ACCUPRIL) 20 MG tablet, Take 1 tablet (20 mg total) by mouth daily., Disp: 90 tablet, Rfl: 3  Orders Placed This Encounter  Procedures  . CT CORONARY MORPH W/CTA COR W/SCORE W/CA W/CM &/OR WO/CM  . CT CORONARY FRACTIONAL FLOW RESERVE DATA PREP  . CT CORONARY FRACTIONAL FLOW RESERVE FLUID ANALYSIS  . CMP14+EGFR  . LDL cholesterol, direct  . Lipid Panel With LDL/HDL Ratio  . EKG 12-Lead  . PCV ECHOCARDIOGRAM COMPLETE    There are no Patient Instructions on file for this visit.   --Continue cardiac medications as reconciled in final medication list. --Return in about 4 weeks (around 07/27/2020) for Follow up, BP, Review test results, Lipid. Or sooner if needed. --Continue follow-up with your primary care physician regarding the management of your other chronic comorbid conditions.  Patient's questions and concerns were addressed to his satisfaction. He voices understanding of the instructions provided during this encounter.    This note was created using a voice recognition software as a result there may be grammatical errors inadvertently enclosed that do not reflect the nature of this encounter. Every attempt is made to correct such errors.  Total time spent 63 minutes reviewing his history of present illness, obtaining prior heart catheterization reports and reviewing them with the patient and summarized findings noted above, independently reviewing prior labs, review of his primary care provider's office notes, discussing disease processes, coordination of care, and discussing cardiovascular testing.   Rex Kras, [place], [facility]  Pager: [phone] Office: [phone]

## 2020-06-29 ENCOUNTER — Ambulatory Visit: Payer: 59 | Admitting: Cardiology

## 2020-06-29 ENCOUNTER — Other Ambulatory Visit: Payer: Self-pay

## 2020-06-29 ENCOUNTER — Encounter: Payer: Self-pay | Admitting: Cardiology

## 2020-06-29 VITALS — BP 148/80 | HR 67 | Temp 98.4°F | Resp 16 | Ht 72.0 in | Wt 203.0 lb

## 2020-06-29 DIAGNOSIS — R9431 Abnormal electrocardiogram [ECG] [EKG]: Secondary | ICD-10-CM

## 2020-06-29 DIAGNOSIS — E7800 Pure hypercholesterolemia, unspecified: Secondary | ICD-10-CM

## 2020-06-29 DIAGNOSIS — R011 Cardiac murmur, unspecified: Secondary | ICD-10-CM

## 2020-06-29 DIAGNOSIS — I10 Essential (primary) hypertension: Secondary | ICD-10-CM

## 2020-06-29 MED ORDER — METOPROLOL TARTRATE 25 MG PO TABS
25.0000 mg | ORAL_TABLET | Freq: Two times a day (BID) | ORAL | 0 refills | Status: DC
Start: 2020-06-29 — End: 2021-04-07

## 2020-07-02 LAB — CMP14+EGFR
ALT: 28 IU/L (ref 0–44)
AST: 40 IU/L (ref 0–40)
Albumin/Globulin Ratio: 1.7 (ref 1.2–2.2)
Albumin: 4.8 g/dL (ref 3.8–4.9)
Alkaline Phosphatase: 78 IU/L (ref 44–121)
BUN/Creatinine Ratio: 14 (ref 9–20)
BUN: 18 mg/dL (ref 6–24)
Bilirubin Total: 0.3 mg/dL (ref 0.0–1.2)
CO2: 20 mmol/L (ref 20–29)
Calcium: 9.8 mg/dL (ref 8.7–10.2)
Chloride: 100 mmol/L (ref 96–106)
Creatinine, Ser: 1.29 mg/dL — ABNORMAL HIGH (ref 0.76–1.27)
Globulin, Total: 2.9 g/dL (ref 1.5–4.5)
Glucose: 119 mg/dL — ABNORMAL HIGH (ref 65–99)
Potassium: 4.4 mmol/L (ref 3.5–5.2)
Sodium: 138 mmol/L (ref 134–144)
Total Protein: 7.7 g/dL (ref 6.0–8.5)
eGFR: 66 mL/min/{1.73_m2} (ref >59)

## 2020-07-02 LAB — LDL CHOLESTEROL, DIRECT: LDL Direct: 189 mg/dL — ABNORMAL HIGH (ref 0–99)

## 2020-07-02 LAB — LIPID PANEL WITH LDL/HDL RATIO
Cholesterol, Total: 264 mg/dL — ABNORMAL HIGH (ref 100–199)
HDL: 40 mg/dL (ref >39)
LDL Chol Calc (NIH): 182 mg/dL — ABNORMAL HIGH (ref 0–99)
LDL/HDL Ratio: 4.6 ratio — ABNORMAL HIGH (ref 0.0–3.6)
Triglycerides: 220 mg/dL — ABNORMAL HIGH (ref 0–149)
VLDL Cholesterol Cal: 42 mg/dL — ABNORMAL HIGH (ref 5–40)

## 2020-07-08 ENCOUNTER — Other Ambulatory Visit: Payer: Self-pay

## 2020-07-08 ENCOUNTER — Ambulatory Visit: Payer: 59

## 2020-07-08 DIAGNOSIS — R011 Cardiac murmur, unspecified: Secondary | ICD-10-CM

## 2020-07-19 ENCOUNTER — Telehealth: Payer: Self-pay

## 2020-07-19 NOTE — Telephone Encounter (Signed)
Called and spoke to pt regarding echo results. Pt voiced understanding.

## 2020-07-20 ENCOUNTER — Other Ambulatory Visit: Payer: Self-pay | Admitting: Cardiology

## 2020-07-20 DIAGNOSIS — E7800 Pure hypercholesterolemia, unspecified: Secondary | ICD-10-CM

## 2020-07-20 MED ORDER — ROSUVASTATIN CALCIUM 20 MG PO TABS: 20.0000 mg | ORAL_TABLET | Freq: Every day | ORAL | 0 refills | Status: DC

## 2020-07-26 ENCOUNTER — Telehealth (HOSPITAL_COMMUNITY): Payer: Self-pay | Admitting: Emergency Medicine

## 2020-07-26 NOTE — Telephone Encounter (Signed)
Reaching out to patient to offer assistance regarding upcoming cardiac imaging study; pt verbalizes understanding of appt date/time, parking situation and where to check in, pre-test NPO status and medications ordered, and verified current allergies; name and call back number provided for further questions should they arise Marchia Bond RN Navigator Cardiac Imaging [facility] [phone] office [phone] cell  25mg  metoprolol tartrate BID

## 2020-07-28 ENCOUNTER — Ambulatory Visit (HOSPITAL_COMMUNITY)
Admission: RE | Admit: 2020-07-28 | Discharge: 2020-07-28 | Disposition: A | Discharge status: Home / Self Care | Payer: 59 | Source: Ambulatory Visit | Attending: Cardiology | Admitting: Cardiology

## 2020-07-28 ENCOUNTER — Ambulatory Visit (HOSPITAL_COMMUNITY)
Admission: RE | Admit: 2020-07-28 | Discharge: 2020-07-28 | Disposition: A | Discharge status: Home / Self Care | Payer: 59 | Source: Ambulatory Visit | Attending: Medical | Admitting: Medical

## 2020-07-28 ENCOUNTER — Other Ambulatory Visit: Payer: Self-pay

## 2020-07-28 DIAGNOSIS — R9431 Abnormal electrocardiogram [ECG] [EKG]: Secondary | ICD-10-CM | POA: Diagnosis not present

## 2020-07-28 MED ORDER — NITROGLYCERIN 0.4 MG SL SUBL
SUBLINGUAL_TABLET | SUBLINGUAL | Status: AC
  Filled 2020-07-28: qty 2

## 2020-07-28 MED ORDER — IOHEXOL 350 MG/ML SOLN
100.0000 mL | Freq: Once | INTRAVENOUS | Status: AC | PRN
  Administered 2020-07-28: 100 mL via INTRAVENOUS

## 2020-07-28 MED ORDER — NITROGLYCERIN 0.4 MG SL SUBL
0.8000 mg | SUBLINGUAL_TABLET | Freq: Once | SUBLINGUAL | Status: AC
  Administered 2020-07-28: 0.8 mg via SUBLINGUAL

## 2020-08-12 ENCOUNTER — Ambulatory Visit: Payer: 59 | Admitting: Cardiology

## 2020-09-13 NOTE — Telephone Encounter (Signed)
Error

## 2021-01-28 ENCOUNTER — Other Ambulatory Visit: Payer: Self-pay | Admitting: Medical

## 2021-01-28 NOTE — Telephone Encounter (Signed)
Sheena please schedule him for a appointment as he has not been in over year. I will refill for 30 days

## 2021-01-28 NOTE — Telephone Encounter (Signed)
Pt is requesting refill on this medication

## 2021-02-08 ENCOUNTER — Encounter: Payer: 59 | Admitting: Medical

## 2021-02-23 ENCOUNTER — Telehealth: Payer: Self-pay | Admitting: Medical

## 2021-02-23 MED ORDER — QUINAPRIL HCL 20 MG PO TABS: 20.0000 mg | ORAL_TABLET | Freq: Every day | ORAL | 0 refills | Status: DC

## 2021-02-23 NOTE — Telephone Encounter (Signed)
Pt called and schedule a cpe for December. He needs refills on quinapril until then. Pt uses [facility] on [address].

## 2021-03-25 ENCOUNTER — Telehealth: Payer: Self-pay

## 2021-03-25 NOTE — Telephone Encounter (Signed)
Pt needs refill Quinapril to hold til CPE 04/07/21 to [facility]

## 2021-03-28 MED ORDER — QUINAPRIL HCL 20 MG PO TABS: 20.0000 mg | ORAL_TABLET | Freq: Every day | ORAL | 0 refills | Status: DC

## 2021-03-28 NOTE — Telephone Encounter (Signed)
done

## 2021-04-07 ENCOUNTER — Ambulatory Visit: Payer: 59 | Admitting: Medical

## 2021-04-07 ENCOUNTER — Other Ambulatory Visit: Payer: Self-pay

## 2021-04-07 VITALS — BP 120/70 | HR 70 | Ht 72.0 in | Wt 191.8 lb

## 2021-04-07 DIAGNOSIS — E785 Hyperlipidemia, unspecified: Secondary | ICD-10-CM

## 2021-04-07 DIAGNOSIS — N529 Male erectile dysfunction, unspecified: Secondary | ICD-10-CM

## 2021-04-07 DIAGNOSIS — F419 Anxiety disorder, unspecified: Secondary | ICD-10-CM

## 2021-04-07 DIAGNOSIS — Z7185 Encounter for immunization safety counseling: Secondary | ICD-10-CM

## 2021-04-07 DIAGNOSIS — Z0000 Encounter for general adult medical examination without abnormal findings: Secondary | ICD-10-CM

## 2021-04-07 DIAGNOSIS — Z566 Other physical and mental strain related to work: Secondary | ICD-10-CM

## 2021-04-07 DIAGNOSIS — K219 Gastro-esophageal reflux disease without esophagitis: Secondary | ICD-10-CM

## 2021-04-07 DIAGNOSIS — Z125 Encounter for screening for malignant neoplasm of prostate: Secondary | ICD-10-CM

## 2021-04-07 DIAGNOSIS — I10 Essential (primary) hypertension: Secondary | ICD-10-CM

## 2021-04-07 DIAGNOSIS — Z131 Encounter for screening for diabetes mellitus: Secondary | ICD-10-CM

## 2021-04-07 MED ORDER — CLONAZEPAM 0.5 MG PO TABS: 0.5000 mg | ORAL_TABLET | Freq: Every evening | ORAL | 1 refills | Status: DC | PRN

## 2021-04-07 MED ORDER — QUINAPRIL HCL 20 MG PO TABS: 20.0000 mg | ORAL_TABLET | Freq: Every day | ORAL | 3 refills | Status: DC

## 2021-04-07 NOTE — Progress Notes (Signed)
Subjective:   HPI  Omar Phillips is a 55 y.o. male who presents for Chief Complaint  Patient presents with   fasting cpe    Fasting cpe, no concerns    Patient Care Team: Armani Brar, [name] as PCP - General (Family Medicine) Sees dentist Sees eye doctor Dr. Rex Kras, cardiology Dr. Carol Ada, GI   Concerns: Doing well.   Saw cardiology and GI this year.    Reviewed their medical, surgical, family, social, medication, and allergy history and updated chart as appropriate.  Past Medical History:  Diagnosis Date   Anxiety    Chest pain 2006   hospital eval   Dyslipidemia    GERD (gastroesophageal reflux disease)    History of cardiovascular stress test 2006   [facility]   Hypertension     Past Surgical History:  Procedure Laterality Date   LACERATION REPAIR     left forearm, remote past   TESTICLE TORSION REDUCTION     teenage years    Family History  Problem Relation Age of Onset   Hypertension Mother    Hypothyroidism Mother    Diabetes Mother    Hypertension Sister    Other Father        history unknown   Hypertension Brother    Diabetes Brother    Cancer Maternal Aunt        breast   Stroke Maternal Uncle    Hypertension Brother    Hypertension Brother    Heart disease Neg Hx      Current Outpatient Medications:    Multiple Vitamin (MULTIVITAMIN) capsule, Take 1 capsule by mouth daily., Disp: , Rfl:    Omega-3 Fatty Acids (FISH OIL PO), Take by mouth., Disp: , Rfl:    clonazePAM (KLONOPIN) 0.5 MG tablet, Take 1 tablet (0.5 mg total) by mouth at bedtime as needed for anxiety., Disp: 20 tablet, Rfl: 1   quinapril (ACCUPRIL) 20 MG tablet, Take 1 tablet (20 mg total) by mouth daily., Disp: 90 tablet, Rfl: 3  Allergies  Allergen Reactions   Lipitor [Atorvastatin]     Shakes, didn't feel good on it     Review of Systems Constitutional: -fever, -chills, -sweats, -unexpected weight change, -decreased appetite,  -fatigue Allergy: -sneezing, -itching, -congestion Dermatology: -changing moles, --rash, -lumps ENT: -runny nose, -ear pain, -sore throat, -hoarseness, -sinus pain, -teeth pain, - ringing in ears, -hearing loss, -nosebleeds Cardiology: -chest pain, -palpitations, -swelling, -difficulty breathing when lying flat, -waking up short of breath Respiratory: -cough, -shortness of breath, -difficulty breathing with exercise or exertion, -wheezing, -coughing up blood Gastroenterology: -abdominal pain, -nausea, -vomiting, -diarrhea, -constipation, -blood in stool, -changes in bowel movement, -difficulty swallowing or eating Hematology: -bleeding, -bruising  Musculoskeletal: -joint aches, -muscle aches, -joint swelling, -back pain, -neck pain, -cramping, -changes in gait Ophthalmology: denies vision changes, eye redness, itching, discharge Urology: -burning with urination, -difficulty urinating, -blood in urine, -urinary frequency, -urgency, -incontinence Neurology: -headache, -weakness, -tingling, -numbness, -memory loss, -falls, -dizziness Psychology: -depressed mood, -agitation, -sleep problems Male GU: no testicular mass, pain, no lymph nodes swollen, no swelling, no rash.  Depression screen [facility] 2/9 04/07/2021 01/23/2020 08/26/2018 01/08/2017  Decreased Interest 0 0 0 0  Down, Depressed, Hopeless 0 0 0 0  PHQ - 2 Score 0 0 0 0  Altered sleeping - 0 - -  Tired, decreased energy - 0 - -  Change in appetite - 0 - -  Feeling bad or failure about yourself  - 0 - -  Trouble concentrating - 0 - -  Moving slowly or fidgety/restless - 0 - -  Suicidal thoughts - 0 - -  PHQ-9 Score - 0 - -        Objective:  BP 120/70    Pulse 70    Ht 6' (1.829 m)    Wt 191 lb 12.8 oz (87 kg)    BMI 26.01 kg/m   General appearance: alert, no distress, WD/WN, African American male Skin: unremarkable HEENT: normocephalic, conjunctiva/corneas normal, sclerae anicteric, PERRLA, EOMi Neck: supple, no lymphadenopathy,  no thyromegaly, no masses, normal ROM, no bruits Chest: non tender, normal shape and expansion Heart: RRR, normal S1, S2, no murmurs Lungs: CTA bilaterally, no wheezes, rhonchi, or rales Abdomen: +bs, soft, non tender, non distended, no masses, no hepatomegaly, no splenomegaly, no bruits Back: non tender, normal ROM, no scoliosis Musculoskeletal: upper extremities non tender, no obvious deformity, normal ROM throughout, lower extremities non tender, no obvious deformity, normal ROM throughout Extremities: no edema, no cyanosis, no clubbing Pulses: 2+ symmetric, upper and lower extremities, normal cap refill Neurological: alert, oriented x 3, CN2-12 intact, strength normal upper extremities and lower extremities, sensation normal throughout, DTRs 2+ throughout, no cerebellar signs, gait normal Psychiatric: normal affect, behavior normal, pleasant  GU: normal male external genitalia,circumcised, nontender, no masses, no hernia, no lymphadenopathy Rectal: anus normal tone, prostate WNL, no nodules  Assessment and Plan :   Encounter Diagnoses  Name Primary?   Encounter for health maintenance examination in adult Yes   Dyslipidemia    Work stress    Vaccine counseling    Screening for prostate cancer    Gastroesophageal reflux disease without esophagitis    Essential hypertension    Erectile dysfunction, unspecified erectile dysfunction type    Anxiety    Screening for diabetes mellitus     This visit was a preventative care visit, also known as wellness visit or routine physical.   Topics typically include healthy lifestyle, diet, exercise, preventative care, vaccinations, sick and well care, proper use of emergency dept and after hours care, as well as other concerns.     Recommendations: Continue to return yearly for your annual wellness and preventative care visits.  This gives [place] a chance to discuss healthy lifestyle, exercise, vaccinations, review your chart record, and perform  screenings where appropriate.  I recommend you see your eye doctor yearly for routine vision care.  I recommend you see your dentist yearly for routine dental care including hygiene visits twice yearly.   Vaccination recommendations were reviewed Immunization History  Administered Date(s) Administered   Td 12/07/2006   Tdap 11/29/2009, 01/08/2017   Shingles vaccine:  I recommend you have a shingles vaccine to help prevent shingles or herpes zoster outbreak.   Please call your insurer to inquire about coverage for the Shingrix vaccine given in 2 doses.   Some insurers cover this vaccine after age 61, some cover this after age 9.  If your insurer covers this, then call to schedule appointment to have this vaccine here.  I recommend yearly flu shot  Consider covid vaccine    Screening for cancer: Colon cancer screening: I reviewed your colonoscopy on file that is up to date from 06/2020, normal, repeat 10 years  We discussed PSA, prostate exam, and prostate cancer screening risks/benefits.     Skin cancer screening: Check your skin regularly for new changes, growing lesions, or other lesions of concern Come in for evaluation if you have skin lesions of concern.  Lung cancer screening: If you have a greater than 20 pack year history of tobacco use, then you may qualify for lung cancer screening with a chest CT scan.   Please call your insurance company to inquire about coverage for this test.  We currently don't have screenings for other cancers besides breast, cervical, colon, and lung cancers.  If you have a strong family history of cancer or have other cancer screening concerns, please let me know.    Bone health: Get at least 150 minutes of aerobic exercise weekly Get weight bearing exercise at least once weekly Bone density test:  A bone density test is an imaging test that uses a type of X-ray to measure the amount of calcium and other minerals in your bones. The test may  be used to diagnose or screen you for a condition that causes weak or thin bones (osteoporosis), predict your risk for a broken bone (fracture), or determine how well your osteoporosis treatment is working. The bone density test is recommended for females 51 and older, or females or males <35 if certain risk factors such as thyroid disease, long term use of steroids such as for asthma or rheumatological issues, vitamin D deficiency, estrogen deficiency, family history of osteoporosis, self or family history of fragility fracture in first degree relative.    Heart health: Get at least 150 minutes of aerobic exercise weekly Limit alcohol It is important to maintain a healthy blood pressure and healthy cholesterol numbers  Heart disease screening: Screening for heart disease includes screening for blood pressure, fasting lipids, glucose/diabetes screening, BMI height to weight ratio, reviewed of smoking status, physical activity, and diet.    Goals include blood pressure 120/80 or less, maintaining a healthy lipid/cholesterol profile, preventing diabetes or keeping diabetes numbers under good control, not smoking or using tobacco products, exercising most days per week or at least 150 minutes per week of exercise, and eating healthy variety of fruits and vegetables, healthy oils, and avoiding unhealthy food choices like fried food, fast food, high sugar and high cholesterol foods.      Medical care options: I recommend you continue to seek care here first for routine care.  We try really hard to have available appointments Monday through Friday daytime hours for sick visits, acute visits, and physicals.  Urgent care should be used for after hours and weekends for significant issues that cannot wait till the next day.  The emergency department should be used for significant potentially life-threatening emergencies.  The emergency department is expensive, can often have long wait times for less  significant concerns, so try to utilize primary care, urgent care, or telemedicine when possible to avoid unnecessary trips to the emergency department.  Virtual visits and telemedicine have been introduced since the pandemic started in 2020, and can be convenient ways to receive medical care.  We offer virtual appointments as well to assist you in a variety of options to seek medical care.   Separate significant issues discussed: HTN - doing well on current regimen  Anxiety - stable, doing well, high stress emotional job  Impaired fasting glucose and prior hyperlipidemia - labs today, not currently on medication.         Labarron was seen today for fasting cpe.  Diagnoses and all orders for this visit:  Encounter for health maintenance examination in adult -     Comprehensive metabolic panel -     CBC -     PSA -  Hemoglobin A1c -     Lipid panel -     Urinalysis  Dyslipidemia -     Lipid panel  Work stress  Vaccine counseling  Screening for prostate cancer  Gastroesophageal reflux disease without esophagitis  Essential hypertension -     Comprehensive metabolic panel -     Urinalysis  Erectile dysfunction, unspecified erectile dysfunction type  Anxiety  Screening for diabetes mellitus -     Hemoglobin A1c  Other orders -     clonazePAM (KLONOPIN) 0.5 MG tablet; Take 1 tablet (0.5 mg total) by mouth at bedtime as needed for anxiety. -     quinapril (ACCUPRIL) 20 MG tablet; Take 1 tablet (20 mg total) by mouth daily.   Follow-up pending labs, yearly for physical

## 2021-04-07 NOTE — Patient Instructions (Signed)
[facility] [employer] [address], [place], [place] [address] [phone]

## 2021-04-08 LAB — COMPREHENSIVE METABOLIC PANEL
ALT: 27 IU/L (ref 0–44)
AST: 25 IU/L (ref 0–40)
Albumin/Globulin Ratio: 1.8 (ref 1.2–2.2)
Albumin: 4.8 g/dL (ref 3.8–4.9)
Alkaline Phosphatase: 90 IU/L (ref 44–121)
BUN/Creatinine Ratio: 13 (ref 9–20)
BUN: 15 mg/dL (ref 6–24)
Bilirubin Total: 0.7 mg/dL (ref 0.0–1.2)
CO2: 26 mmol/L (ref 20–29)
Calcium: 9.9 mg/dL (ref 8.7–10.2)
Chloride: 102 mmol/L (ref 96–106)
Creatinine, Ser: 1.18 mg/dL (ref 0.76–1.27)
Globulin, Total: 2.7 g/dL (ref 1.5–4.5)
Glucose: 98 mg/dL (ref 70–99)
Potassium: 4.8 mmol/L (ref 3.5–5.2)
Sodium: 140 mmol/L (ref 134–144)
Total Protein: 7.5 g/dL (ref 6.0–8.5)
eGFR: 74 mL/min/{1.73_m2} (ref >59)

## 2021-04-08 LAB — URINALYSIS
Bilirubin, UA: NEGATIVE
Glucose, UA: NEGATIVE
Ketones, UA: NEGATIVE
Leukocytes,UA: NEGATIVE
Nitrite, UA: NEGATIVE
RBC, UA: NEGATIVE
Specific Gravity, UA: >=1.03 — AB (ref 1.005–1.030)
Urobilinogen, Ur: 0.2 mg/dL (ref 0.2–1.0)
pH, UA: 5.5 (ref 5.0–7.5)

## 2021-04-08 LAB — CBC
Hematocrit: 45.2 % (ref 37.5–51.0)
Hemoglobin: 15.4 g/dL (ref 13.0–17.7)
MCH: 30.3 pg (ref 26.6–33.0)
MCHC: 34.1 g/dL (ref 31.5–35.7)
MCV: 89 fL (ref 79–97)
Platelets: 300 10*3/uL (ref 150–450)
RBC: 5.08 x10E6/uL (ref 4.14–5.80)
RDW: 12.6 % (ref 11.6–15.4)
WBC: 4.5 10*3/uL (ref 3.4–10.8)

## 2021-04-08 LAB — LIPID PANEL
Chol/HDL Ratio: 6 ratio — ABNORMAL HIGH (ref 0.0–5.0)
Cholesterol, Total: 293 mg/dL — ABNORMAL HIGH (ref 100–199)
HDL: 49 mg/dL (ref >39)
LDL Chol Calc (NIH): 210 mg/dL — ABNORMAL HIGH (ref 0–99)
Triglycerides: 179 mg/dL — ABNORMAL HIGH (ref 0–149)
VLDL Cholesterol Cal: 34 mg/dL (ref 5–40)

## 2021-04-08 LAB — HEMOGLOBIN A1C
Est. average glucose Bld gHb Est-mCnc: 140 mg/dL
Hgb A1c MFr Bld: 6.5 % — ABNORMAL HIGH (ref 4.8–5.6)

## 2021-04-08 LAB — PSA: Prostate Specific Ag, Serum: 1.7 ng/mL (ref 0.0–4.0)

## 2021-05-02 ENCOUNTER — Other Ambulatory Visit: Payer: Self-pay | Admitting: Medical

## 2021-05-02 ENCOUNTER — Telehealth: Payer: Self-pay

## 2021-05-02 MED ORDER — VALSARTAN 80 MG PO TABS: 80.0000 mg | ORAL_TABLET | Freq: Every day | ORAL | 0 refills | Status: DC

## 2021-05-02 NOTE — Telephone Encounter (Signed)
Pt. Called stating that he has been waiting on [facility] to get his Quinapril in but they told him this weekend that there was a recall on it and he needed to contact his doctor to have it switched to a different med. He said he would not like to take lisinopril though. He would like something in the same class as Quinapril if possible sent to [facility].

## 2021-08-12 ENCOUNTER — Other Ambulatory Visit: Payer: Self-pay | Admitting: Medical

## 2021-10-28 ENCOUNTER — Ambulatory Visit: Payer: 59 | Admitting: Medical

## 2021-10-28 VITALS — BP 130/88 | HR 64 | Temp 97.0°F | Wt 190.2 lb

## 2021-10-28 DIAGNOSIS — Z566 Other physical and mental strain related to work: Secondary | ICD-10-CM | POA: Diagnosis not present

## 2021-10-28 DIAGNOSIS — L732 Hidradenitis suppurativa: Secondary | ICD-10-CM | POA: Diagnosis not present

## 2021-10-28 DIAGNOSIS — F419 Anxiety disorder, unspecified: Secondary | ICD-10-CM | POA: Diagnosis not present

## 2021-10-28 DIAGNOSIS — D171 Benign lipomatous neoplasm of skin and subcutaneous tissue of trunk: Secondary | ICD-10-CM

## 2021-10-28 MED ORDER — SULFAMETHOXAZOLE-TRIMETHOPRIM 800-160 MG PO TABS: 1.0000 | ORAL_TABLET | Freq: Two times a day (BID) | ORAL | 0 refills | Status: DC

## 2021-10-28 MED ORDER — ESCITALOPRAM OXALATE 10 MG PO TABS
10.0000 mg | ORAL_TABLET | Freq: Every day | ORAL | 0 refills | Status: DC
Start: 2021-10-28 — End: 2022-03-08

## 2021-10-28 MED ORDER — CLONAZEPAM 0.5 MG PO TABS: 0.5000 mg | ORAL_TABLET | Freq: Every evening | ORAL | 1 refills | Status: DC | PRN
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
## 2021-11-22 ENCOUNTER — Other Ambulatory Visit: Payer: Self-pay | Admitting: Medical

## 2021-11-22 NOTE — Telephone Encounter (Signed)
Refill sildenafil last apt 10/28/21 did not see in med list.

## 2021-12-14 ENCOUNTER — Encounter: Payer: Self-pay | Admitting: Internal Medicine

## 2022-02-16 ENCOUNTER — Encounter: Payer: Self-pay | Admitting: Emergency Medicine

## 2022-02-16 ENCOUNTER — Other Ambulatory Visit: Payer: Self-pay

## 2022-02-16 ENCOUNTER — Emergency Department
Admission: EM | Admit: 2022-02-16 | Discharge: 2022-02-16 | Disposition: A | Discharge status: Home / Self Care | Payer: 59 | Attending: Emergency Medicine | Admitting: Emergency Medicine

## 2022-02-16 DIAGNOSIS — M5442 Lumbago with sciatica, left side: Secondary | ICD-10-CM

## 2022-02-16 DIAGNOSIS — M545 Low back pain, unspecified: Secondary | ICD-10-CM | POA: Diagnosis present

## 2022-02-16 DIAGNOSIS — Z79899 Other long term (current) drug therapy: Secondary | ICD-10-CM | POA: Insufficient documentation

## 2022-02-16 DIAGNOSIS — M5116 Intervertebral disc disorders with radiculopathy, lumbar region: Secondary | ICD-10-CM | POA: Diagnosis not present

## 2022-02-16 DIAGNOSIS — M5126 Other intervertebral disc displacement, lumbar region: Secondary | ICD-10-CM

## 2022-02-16 MED ORDER — DEXAMETHASONE SODIUM PHOSPHATE 10 MG/ML IJ SOLN
10.0000 mg | Freq: Once | INTRAMUSCULAR | Status: AC
  Administered 2022-02-16: 10 mg via INTRAMUSCULAR
  Filled 2022-02-16: qty 1

## 2022-02-16 MED ORDER — KETOROLAC TROMETHAMINE 30 MG/ML IJ SOLN
30.0000 mg | Freq: Once | INTRAMUSCULAR | Status: AC
  Administered 2022-02-16: 30 mg via INTRAMUSCULAR
  Filled 2022-02-16: qty 1

## 2022-02-16 MED ORDER — PREDNISONE 10 MG PO TABS: 10.0000 mg | ORAL_TABLET | ORAL | 0 refills | Status: DC

## 2022-02-16 MED ORDER — GABAPENTIN 300 MG PO CAPS: 300.0000 mg | ORAL_CAPSULE | Freq: Three times a day (TID) | ORAL | 0 refills | Status: DC

## 2022-02-16 NOTE — ED Triage Notes (Signed)
Pt states a history of sciatica and that 3 weeks ago we went to his PCP for the pain and was given a shot. Pt states he had an MRI this past Monday. Pt states the pain has gotten worse, and that he can hardly walk due to the pain.

## 2022-02-16 NOTE — ED Provider Triage Note (Signed)
Emergency Medicine Provider Triage Evaluation Note  Omar Phillips , a 56 y.o. male  was evaluated in triage.  Pt complains of acute on chronic sciatica pain. No relief with injections, gabapentin, and another medicine prescribed by his orthopedist. MRI last Monday with no new findings from previous in 2010.  Physical Exam  BP (!) 164/115   Pulse 70   Temp 98.5 F (36.9 C)   Resp 20   Ht 6' (1.829 m)   Wt 86.2 kg   SpO2 99%   BMI 25.77 kg/m  Gen:   Awake, no distress   Resp:  Normal effort  MSK:   Moves extremities without difficulty  Other:    Medical Decision Making  Medically screening exam initiated at 1:07 PM.  Appropriate orders placed.  Omar Phillips was informed that the remainder of the evaluation will be completed by another provider, this initial triage assessment does not replace that evaluation, and the importance of remaining in the ED until their evaluation is complete.    Victorino Dike, FNP 02/16/22 1309

## 2022-02-16 NOTE — ED Provider Notes (Signed)
[facility] Provider Note  Patient Contact: 4:35 PM (approximate)   History   Back Pain   HPI  Omar Phillips is a 56 y.o. male who presents emergency department with any of low back pain.  Patient has a history of herniated disc from 2010.  Patient had an epidural injection at the time of injury, has done well until a few weeks ago.  Patient was working in his yard, seating when he started to have some increasing back pain.  He saw his orthopedist, has had an MRI that was performed 3 days ago.  Patient still has a herniated disc, no new issues and no evidence of central cord compression according to the patient.  Patient is scheduled to have epidural injections in a week and a half.  Patient is currently on Robaxin, diclofenac and gabapentin.  He is only taking 300 mg daily of gabapentin.  Patient is requesting medication changes until he can see his orthopedist for epidural injection.  No bowel or bladder dysfunction, saddle anesthesia or paresthesias.  Patient has MRI results with him that were performed in a facility that does not have access to epic.     Physical Exam   Triage Vital Signs: ED Triage Vitals  Enc Vitals Group     BP 02/16/22 1306 (!) 164/115     Pulse Rate 02/16/22 1306 70     Resp 02/16/22 1306 20     Temp 02/16/22 1306 98.5 F (36.9 C)     Temp src --      SpO2 02/16/22 1306 99 %     Weight 02/16/22 1307 190 lb (86.2 kg)     Height 02/16/22 1307 6' (1.829 m)     Head Circumference --      Peak Flow --      Pain Score 02/16/22 1307 10     Pain Loc --      Pain Edu? --      Excl. in [place]? --     Most recent vital signs: Vitals:   02/16/22 1306  BP: (!) 164/115  Pulse: 70  Resp: 20  Temp: 98.5 F (36.9 C)  SpO2: 99%     General: Alert and in no acute distress.  Cardiovascular:  Good peripheral perfusion Respiratory: Normal respiratory effort without tachypnea or retractions. Lungs CTAB.  Musculoskeletal: Full range  of motion to all extremities.  No visible signs of trauma to the lumbar spine.  Patient has tenderness diffusely to the lumbar spine with some extension into the SI joint and sciatic notch.  No palpable abnormality or step-off.  Pulses sensation intact bilateral lower extremities Neurologic:  No gross focal neurologic deficits are appreciated.  Skin:   No rash noted Other:   ED Results / Procedures / Treatments   Labs (all labs ordered are listed, but only abnormal results are displayed) Labs Reviewed - No data to display   EKG     RADIOLOGY    No results found.  PROCEDURES:  Critical Care performed: No  Procedures   MEDICATIONS ORDERED IN ED: Medications  ketorolac (TORADOL) 30 MG/ML injection 30 mg (has no administration in time range)  dexamethasone (DECADRON) injection 10 mg (has no administration in time range)     IMPRESSION / MDM / ASSESSMENT AND PLAN / ED COURSE  I reviewed the triage vital signs and the nursing notes.  Differential diagnosis includes, but is not limited to, lumbago, sciatica, herniated disc, cauda equina  Patient's presentation is most consistent with acute presentation with potential threat to life or bodily function.   Patient's diagnosis is consistent with low back pain with sciatica, herniated disc.  Patient presents to the emergency department with a known herniated disc.  Just had an MRI performed from his orthopedist.  Results are not in epic but patient does have the results with him showing herniated disc without evidence of cauda equina or other concerning findings.  Patient has not developed any new pain or deficits, is here primarily for medication for relief of symptoms while waiting for epidural injection.  As such I will not pursue further imaging as he just had an MRI reviewed by his orthopedist 3 days ago.  Patient is scheduled for an epidural injection with his orthopedist but is requesting  additional medications for symptom relief.  Patient is currently taking diclofenac orally, 300 mg of gabapentin only at nighttime, muscle relaxer as needed.  At this time I will increase the patient's gabapentin, and a prednisone taper.  Concerning signs and symptoms of bowel or bladder dysfunction, saddle anesthesia or paresthesia are discussed with the patient to return emergently for.  Otherwise follow-up with his orthopedist for epidural injection.. Patient is given ED precautions to return to the ED for any worsening or new symptoms.        FINAL CLINICAL IMPRESSION(S) / ED DIAGNOSES   Final diagnoses:  Acute midline low back pain with left-sided sciatica  Lumbar herniated disc     Rx / DC Orders   ED Discharge Orders     None        Note:  This document was prepared using Dragon voice recognition software and may include unintentional dictation errors.   Darletta Moll, PA-C 02/16/22 1725    Vanessa Marfa, MD 02/16/22 1754

## 2022-02-17 ENCOUNTER — Ambulatory Visit: Payer: 59 | Admitting: Medical

## 2022-02-17 VITALS — BP 130/80 | HR 89 | Wt 188.8 lb

## 2022-02-17 DIAGNOSIS — R937 Abnormal findings on diagnostic imaging of other parts of musculoskeletal system: Secondary | ICD-10-CM | POA: Diagnosis not present

## 2022-02-17 DIAGNOSIS — G8929 Other chronic pain: Secondary | ICD-10-CM

## 2022-02-17 DIAGNOSIS — M48061 Spinal stenosis, lumbar region without neurogenic claudication: Secondary | ICD-10-CM | POA: Diagnosis not present

## 2022-02-17 DIAGNOSIS — M5410 Radiculopathy, site unspecified: Secondary | ICD-10-CM | POA: Diagnosis not present

## 2022-02-17 DIAGNOSIS — M5440 Lumbago with sciatica, unspecified side: Secondary | ICD-10-CM | POA: Diagnosis not present

## 2022-02-17 DIAGNOSIS — M48062 Spinal stenosis, lumbar region with neurogenic claudication: Secondary | ICD-10-CM | POA: Insufficient documentation

## 2022-02-17 NOTE — Patient Instructions (Signed)
Recommendations: Finish out the prednisone taper.  This is a steroid.  This should be about a weeks worth of medication. While on the prednisone, do not take other anti-inflammatories including diclofenac, meloxicam, Aleve, ibuprofen, Motrin, BC powders or other anti-inflammatories. When you finish the prednisone then you can use the diclofenac or meloxicam You were on gabapentin once a day but just were increased to 3 times a day starting today. Be aware gabapentin can make you sleepy, but I would go ahead and do it 3 times a day as advised by orthopedics You were prescribed Robaxin methocarbamol muscle relaxer by orthopedics.  This is an as needed medication for spasm or tightness or soreness in the muscle.  Uses as needed but this can also cause sedation. Follow-up with orthopedics next week for the steroid injection as planned Take it easy over the weekend. If you feel like doing activity next week avoid strenuous activity, heavy lifting, and just do light weight activities such as walking on the elliptical or treadmill, stationary bike or walking outside.  Avoid hard pavement such as asphalt if possible

## 2022-02-17 NOTE — Progress Notes (Signed)
Subjective:  Omar Phillips is a 56 y.o. male who presents for Chief Complaint  Patient presents with   back pain    Back pain, ortho gave shot 2 weeks ago and it didn't work. Went to ER yesterday and was given shots yesterday and no relief. Went to ortho today and herniated disc L4- but still having hip pain now.  just wanted to give update here     Here for back pain about that started about 1.5 month ago.  Accompanied by his wife today.    He notes history of back pain on and off in the past.  He had a particularly bad episode about a decade ago and had an MRI at that time.  But for the last several years has been fairly okay except in the last 4 to 5 weeks.  Had been having back pain and left hip pain for last month+  Gradually started having pain down left leg.   Been using machinery doing yard work, Product/process development scientist.  Had been using some ibuprofen.   Pain got worse over time where he was limited in walking.   Has had hx/o herniated disc 2010. He called ortho, Dr. Mina Marble, went in for appt, was given gabapentin, meloxicam, had been using this.  Also been using robaxin muscle relaxer.  Went back in for steroid shot 2 weeks ago.  No prior back surgery.    Went to [facility] ED 1 week ago for back pain that got unbearable.  Was given shot for pain and put on Diclofenac  Yesterday got up, broke down in pain again, left burning, shooting pain, back pain.   Went to Emergency Dept local here in [facility] yesterday.  Was given shot for pain this ED visit as well and given round of prednisone.  Having steroid shot EDSI next week on schedule with orthopedics.  Here to basically discuss his recent ED visits, his MRI and neck steps.  Want some guidance.  He also wants a different muscle laxer.  He feels like the Robaxin given by the orthopedics does not help  He just started prednisone taper yesterday, and was on Gabapentin once daily for the past week but was advised to go  to 3 times daily starting today.  No other aggravating or relieving factors.    No other c/o.  Past Medical History:  Diagnosis Date   Anxiety    Chest pain 2006   hospital eval   Dyslipidemia    GERD (gastroesophageal reflux disease)    History of cardiovascular stress test 2006   [facility]   Hypertension    Current Outpatient Medications on File Prior to Visit  Medication Sig Dispense Refill   clonazePAM (KLONOPIN) 0.5 MG tablet Take 1 tablet (0.5 mg total) by mouth at bedtime as needed for anxiety. 20 tablet 1   escitalopram (LEXAPRO) 10 MG tablet Take 1 tablet (10 mg total) by mouth daily. 90 tablet 0   gabapentin (NEURONTIN) 300 MG capsule Take 1 capsule (300 mg total) by mouth 3 (three) times daily. 90 capsule 0   Multiple Vitamin (MULTIVITAMIN) capsule Take 1 capsule by mouth daily.     Omega-3 Fatty Acids (FISH OIL PO) Take by mouth.     predniSONE (DELTASONE) 10 MG tablet Take 1 tablet (10 mg total) by mouth as directed. 42 tablet 0   sildenafil (VIAGRA) 50 MG tablet TAKE 1 TABLET BY MOUTH AS NEEDED FOR ERECTILE DYSFUNCTION 10 tablet 0  sulfamethoxazole-trimethoprim (BACTRIM DS) 800-160 MG tablet Take 1 tablet by mouth 2 (two) times daily. 14 tablet 0   valsartan (DIOVAN) 80 MG tablet Take 1 tablet by mouth once daily 90 tablet 0   No current facility-administered medications on file prior to visit.   Past Surgical History:  Procedure Laterality Date   LACERATION REPAIR     left forearm, remote past   TESTICLE TORSION REDUCTION     teenage years    The following portions of the patient's history were reviewed and updated as appropriate: allergies, current medications, past family history, past medical history, past social history, past surgical history and problem list.  ROS Otherwise as in subjective above    Objective: BP 130/80   Pulse 89   Wt 188 lb 12.8 oz (85.6 kg)   BMI 25.61 kg/m   General appearance: alert, no distress, well developed, well  nourished Psych pleasant, answers question appropriate, Otherwise not examined   Assessment: Encounter Diagnoses  Name Primary?   Chronic bilateral low back pain with sciatica, sciatica laterality unspecified Yes   Spinal stenosis at L4-L5 level    Radicular pain    Abnormal MRI, lumbar spine      Plan: We discussed his 2 recent emergency department visits.  We discussed his recent medications.  I advised against changing muscle laxer today as he is just now going to 3 times daily gabapentin today along with prednisone taper which was just started yesterday.  We discussed that this medicine gabapentin can cause sedation and muscle relaxer can cause sedation as well.  Advised to give the medicine some time to work.  We discussed his MRI findings from 02/13/2022 lumbar spine MRIs showing impression: Lumbar spine degeneration especially affecting the facets, focally notable degeneration L4-5 where there is advanced central stenosis.  Bilateral L5 compression at the subarticular recesses..  This was done through [place] to continue the current plan that orthopedic is laid out for him which includes increasing the gabapentin 3 times a day, finish out the prednisone taper, use Robaxin as needed as prescribed by orthopedics, follow-up with orthopedics next week for epidural steroid injection.  He sees [facility]ar[facility] manager.  Advise he not take any NSAIDs while on the prednisone  Omar Phillips was seen today for back pain.  Diagnoses and all orders for this visit:  Chronic bilateral low back pain with sciatica, sciatica laterality unspecified  Spinal stenosis at L4-L5 level  Radicular pain  Abnormal MRI, lumbar spine    Follow up: As needed

## 2022-02-20 ENCOUNTER — Encounter: Payer: Self-pay | Admitting: Medical

## 2022-02-21 ENCOUNTER — Telehealth: Payer: Self-pay | Admitting: Medical

## 2022-02-21 DIAGNOSIS — R937 Abnormal findings on diagnostic imaging of other parts of musculoskeletal system: Secondary | ICD-10-CM

## 2022-02-21 DIAGNOSIS — M48061 Spinal stenosis, lumbar region without neurogenic claudication: Secondary | ICD-10-CM

## 2022-02-21 DIAGNOSIS — M5410 Radiculopathy, site unspecified: Secondary | ICD-10-CM

## 2022-02-21 DIAGNOSIS — G8929 Other chronic pain: Secondary | ICD-10-CM

## 2022-02-21 NOTE — Telephone Encounter (Signed)
Pt was notified, however he's at work now and says he can't work as he is in so much pain and thinking about taking some time off. He says ortho is taking too long to do anything.

## 2022-02-21 NOTE — Telephone Encounter (Signed)
Pt called and stated he is having to wait on his ortho doctor and while he is waiting he wanted to know if you could refer him to [facility].  Also he asks if Omar Phillips can give him a call at [phone].

## 2022-02-21 NOTE — Telephone Encounter (Signed)
error 

## 2022-02-23 ENCOUNTER — Telehealth: Payer: Self-pay | Admitting: Medical

## 2022-02-23 NOTE — Telephone Encounter (Signed)
Pt having issues with ortho, wants different muscle relaxer, current med not helping Advised him to contact their office again and let them know his concerns

## 2022-03-07 ENCOUNTER — Telehealth: Payer: 59 | Admitting: Medical

## 2022-03-07 DIAGNOSIS — R937 Abnormal findings on diagnostic imaging of other parts of musculoskeletal system: Secondary | ICD-10-CM | POA: Diagnosis not present

## 2022-03-07 DIAGNOSIS — M5410 Radiculopathy, site unspecified: Secondary | ICD-10-CM | POA: Diagnosis not present

## 2022-03-07 DIAGNOSIS — G8929 Other chronic pain: Secondary | ICD-10-CM

## 2022-03-07 DIAGNOSIS — M48061 Spinal stenosis, lumbar region without neurogenic claudication: Secondary | ICD-10-CM | POA: Diagnosis not present

## 2022-03-07 DIAGNOSIS — R262 Difficulty in walking, not elsewhere classified: Secondary | ICD-10-CM

## 2022-03-07 DIAGNOSIS — M5440 Lumbago with sciatica, unspecified side: Secondary | ICD-10-CM | POA: Diagnosis not present

## 2022-03-07 NOTE — Progress Notes (Signed)
Subjective:     Patient ID: Omar Phillips Omar Phillips, male   DOB: 06-Oct-1965, 56 y.o.   MRN: [id number]  This visit type was conducted due to national recommendations for restrictions regarding the COVID-19 Pandemic (e.g. social distancing) in an effort to limit this patient's exposure and mitigate transmission in our community.  Due to their co-morbid illnesses, this patient is at least at moderate risk for complications without adequate follow up.  This format is felt to be most appropriate for this patient at this time.    Documentation for virtual audio and video telecommunications through [facility] encounter:  The patient was located at home. The provider was located in the office. The patient did consent to this visit and is aware of possible charges through their insurance for this visit.  The other persons participating in this telemedicine service were none. Time spent on call was 20 minutes and in review of previous records 20 minutes total.  This virtual service is not related to other E/M service within previous 7 days.   HPI Chief Complaint  Patient presents with   discuss case    Having issues still with back pain, had MRI 11/6 - needed Shot. Had an appt with ortho and was told there is nothing else he can do. Having a lot of issues- not able to sleep, focus. Need a referral to pain management- to [facility] consult for concerns about pain, next steps.  I saw him recently for same on 02/17/22.  He went back to ortho on 03/01/22, had another EDSI but still not getting a lot or relief. Still having lots pain.  Using a cane some now.  Embarrassed as he is sometimes urinating in a bottle as he can't get to bathroom quickly.  His kids are upset seeing him this way.   He wants to see pain management and different surgeon for second opinion.  He has been advised that he needs back surgery but quite scared about pursuing this.    Using robaxin BID, Gabapentin '300mg'$  TID.  He  was upset that ortho couldn't write him out of work despite the pain and limited activity he has currently.   No other aggravating or relieving factors. No other complaint.    Past Medical History:  Diagnosis Date   Anxiety    Chest pain 2006   hospital eval   Dyslipidemia    GERD (gastroesophageal reflux disease)    History of cardiovascular stress test 2006   [facility]   Hypertension    Current Outpatient Medications on File Prior to Visit  Medication Sig Dispense Refill   clonazePAM (KLONOPIN) 0.5 MG tablet Take 1 tablet (0.5 mg total) by mouth at bedtime as needed for anxiety. 20 tablet 1   escitalopram (LEXAPRO) 10 MG tablet Take 1 tablet (10 mg total) by mouth daily. 90 tablet 0   gabapentin (NEURONTIN) 300 MG capsule Take 1 capsule (300 mg total) by mouth 3 (three) times daily. 90 capsule 0   Multiple Vitamin (MULTIVITAMIN) capsule Take 1 capsule by mouth daily.     Omega-3 Fatty Acids (FISH OIL PO) Take by mouth.     sildenafil (VIAGRA) 50 MG tablet TAKE 1 TABLET BY MOUTH AS NEEDED FOR ERECTILE DYSFUNCTION 10 tablet 0   valsartan (DIOVAN) 80 MG tablet Take 1 tablet by mouth once daily 90 tablet 0   No current facility-administered medications on file prior to visit.    Review of Systems As in subjective  Objective:   Physical Exam Due to coronavirus pandemic stay at home measures, patient visit was virtual and they were not examined in person.   There were no vitals taken for this visit.  Gen: wd, wn ,nad Psych: tearful at times, upset      Assessment:     Encounter Diagnoses  Name Primary?   Chronic bilateral low back pain with sciatica, sciatica laterality unspecified Yes   Radicular pain    Spinal stenosis at L4-L5 level    Abnormal MRI, lumbar spine    Difficulty walking        Plan:     I reviewed his recent orthopedic notes, recent lumbar MRI that was abnormal.  At this point we will refer as below to pain clinic and separate neurosurgery  consult for second opinion.  Continue current medications, Robaxin, gabapentin, can add back NSAIDs since he has stopped the prednisone.  I gave him a note out of work out for short-term  [name] was seen today for discuss case.  Diagnoses and all orders for this visit:  Chronic bilateral low back pain with sciatica, sciatica laterality unspecified -     Ambulatory referral to Pain Clinic -     Ambulatory referral to Neurosurgery  Radicular pain -     Ambulatory referral to Pain Clinic -     Ambulatory referral to Neurosurgery  Spinal stenosis at L4-L5 level -     Ambulatory referral to Pain Clinic -     Ambulatory referral to Neurosurgery  Abnormal MRI, lumbar spine -     Ambulatory referral to Pain Clinic -     Ambulatory referral to Neurosurgery  Difficulty walking    F/u pending referral

## 2022-03-08 ENCOUNTER — Encounter: Payer: Self-pay | Admitting: Medical

## 2022-03-08 ENCOUNTER — Ambulatory Visit
Payer: 59 | Attending: Student in an Organized Health Care Education/Training Program | Admitting: Student in an Organized Health Care Education/Training Program

## 2022-03-08 ENCOUNTER — Encounter: Payer: Self-pay | Admitting: Student in an Organized Health Care Education/Training Program

## 2022-03-08 VITALS — BP 136/97 | HR 89 | Temp 97.1°F | Ht 72.0 in | Wt 195.0 lb

## 2022-03-08 DIAGNOSIS — M5410 Radiculopathy, site unspecified: Secondary | ICD-10-CM | POA: Diagnosis not present

## 2022-03-08 DIAGNOSIS — M47816 Spondylosis without myelopathy or radiculopathy, lumbar region: Secondary | ICD-10-CM | POA: Diagnosis not present

## 2022-03-08 DIAGNOSIS — M48062 Spinal stenosis, lumbar region with neurogenic claudication: Secondary | ICD-10-CM | POA: Diagnosis present

## 2022-03-08 MED ORDER — TIZANIDINE HCL 4 MG PO TABS: 4.0000 mg | ORAL_TABLET | Freq: Two times a day (BID) | ORAL | 1 refills | Status: AC | PRN

## 2022-03-08 MED ORDER — GABAPENTIN 300 MG PO CAPS: 300.0000 mg | ORAL_CAPSULE | Freq: Three times a day (TID) | ORAL | 2 refills | Status: DC

## 2022-03-08 NOTE — Progress Notes (Signed)
Safety precautions to be maintained throughout the outpatient stay will include: orient to surroundings, keep bed in low position, maintain call bell within reach at all times, provide assistance with transfer out of bed and ambulation.  

## 2022-03-08 NOTE — Progress Notes (Signed)
Patient: Omar Phillips  Service Category: E/M  Provider: Gillis Santa, MD  DOB: 30-Dec-1965  DOS: 03/08/2022  Referring Provider: Caryl Ada  MRN: [id number]  Setting: Ambulatory outpatient  PCP: Carlena Hurl, PA-C  Type: New Patient  Specialty: Interventional Pain Management    Location: Office  Delivery: Face-to-face     Primary Reason(s) for Visit: Encounter for initial evaluation of one or more chronic problems (new to examiner) potentially causing chronic pain, and posing a threat to normal musculoskeletal function. (Level of risk: High) CC: Leg Pain (Left thigh, left hip)  HPI  Omar Phillips is a 56 y.o. year old, male patient, who comes for the first time to our practice referred by Carlena Hurl, PA-C for our initial evaluation of his chronic pain. He has Anxiety; Essential hypertension; Gastroesophageal reflux disease without esophagitis; Encounter for health maintenance examination in adult; Dyslipidemia; Screening for prostate cancer; Erectile dysfunction; Vaccine counseling; Work stress; Chronic bilateral low back pain with sciatica; Radicular pain of left lower extremity; Spinal stenosis, lumbar region, with neurogenic claudication; Abnormal MRI, lumbar spine; and Lumbar facet arthropathy on their problem list. Today he comes in for evaluation of his Leg Pain (Left thigh, left hip)  Pain Assessment: Location: Left Leg (left thigh) Radiating: denies Onset: More than a month ago Duration: Chronic pain Quality: Tingling, Aching, Constant Severity: 9 /10 (subjective, self-reported pain score)  Effect on ADL: limits ADLs Timing: Constant Modifying factors: meds sometimes BP: (!) 136/97  HR: 89  Onset and Duration: Present longer than 3 months Cause of pain: Unknown Severity: No change since onset, NAS-11 at its worse: 10/10, NAS-11 at its best: 7/10, NAS-11 now: 10/10, and NAS-11 on the average: 10/10 Timing: Not influenced by the time of the day Aggravating  Factors: Prolonged standing and Walking Alleviating Factors: Cold packs, Hot packs, and TENS Associated Problems: Day-time cramps, Night-time cramps, Depression, Pain that wakes patient up, and Pain that does not allow patient to sleep Quality of Pain: Agonizing, Annoying, Burning, Dull, Horrible, Nagging, Sharp, Stabbing, and Uncomfortable Previous Examinations or Tests: MRI scan and X-rays Previous Treatments: Epidural steroid injections  Patient is a pleasant 56 year old male who presents with a chief complaint of left leg pain that started approximately 6 weeks ago.  No inciting or traumatic event.  He has been evaluated by orthopedics for this.  He has trouble walking.  He states that he has seen [facility] orthopedics and that they could not do anything else.  He is currently on Robaxin twice daily 500 mg, gabapentin milligrams 3 times daily.  He is endorsing any side effects or any significant analgesic benefit with these meds patients.  He is trying to go back to work.  He works as a Education officer, museum.  He is here for second opinion.  His lumbar MRI is below which shows moderate to severe spinal stenosis at L4 to compression at the subarticular recess.  This is likely causing his left radicular pain and symptoms.   Meds   Current Outpatient Medications:    Multiple Vitamin (MULTIVITAMIN) capsule, Take 1 capsule by mouth daily., Disp: , Rfl:    Omega-3 Fatty Acids (FISH OIL PO), Take by mouth., Disp: , Rfl:    sildenafil (VIAGRA) 50 MG tablet, TAKE 1 TABLET BY MOUTH AS NEEDED FOR ERECTILE DYSFUNCTION, Disp: 10 tablet, Rfl: 0   tiZANidine (ZANAFLEX) 4 MG tablet, Take 1 tablet (4 mg total) by mouth every 12 (twelve) hours as needed for muscle spasms., Disp: 60  tablet, Rfl: 1   valsartan (DIOVAN) 80 MG tablet, Take 1 tablet by mouth once daily, Disp: 90 tablet, Rfl: 0   gabapentin (NEURONTIN) 300 MG capsule, Take 1 capsule (300 mg total) by mouth 3 (three) times daily., Disp: 180 capsule, Rfl:  2  Imaging Review   MR Lumbar Spine Wo Contrast  Narrative Clinical Data: Low back pain radiating into the right leg.  Recent injury.  MRI LUMBAR SPINE WITHOUT CONTRAST  Technique:  Multiplanar and multiecho pulse sequences of the lumbar spine were obtained without intravenous contrast.  Comparison: 04/12/2007 MRI from [facility]  Findings: Anatomic alignment.  Marrow signal homogeneous.  Mild disc degeneration at L4-5 with desiccation and slight loss of interspace height.  Conus medullaris normal.  The individual disc spaces are examined as follows:  L1-2:  Normal.  L2-3:  Mild facet arthropathy.  No stenosis or disc protrusion.  L3-4:  Mild facet arthropathy.  No stenosis or disc protrusion.  L4-5:  Advanced facet arthropathy.  Central and rightward disc protrusion.  Moderate central canal stenosis and right greater than left L5 nerve root compression.  Mild neural foraminal narrowing without L4 nerve root encroachment.  L5-S1:  Moderate facet arthropathy.  Shallow central and leftward protrusion.  Possible irritation left S1 nerve root.  L5 nerve roots exit the foramina without difficulty.  Compared with the prior study, the stenosis and disc protrusion at L4-5 are worse.  The appearance at L5-S1 is essentially unchanged.  IMPRESSION: Multifactorial spinal stenosis at L4-5, of moderate severity, secondary to a central and rightward protrusion with advanced facet arthropathy; right greater than left L5 nerve root encroachment is seen, and is worse compared to 2009.  Stable shallow central and leftward protrusion at L5-S1.  Provider: Reesa Chew   Lumbar MRI: 02/13/2022 lumbar spine degeneration especially notable degeneration at L4-L5 where there is apparent central canal stenosis. Impression compression of the subarticular recess   L4-L5: Moderate facet spurring with ligamentum flavum thickening, disc narrowing and endplate ridging with bulge and  right paracentral disc protrusion.  Advanced spinal stenosis.  Biforaminal L5 compression at the subarticular recess.  Mild right foraminal narrowing.   Complexity Note: Imaging results reviewed.                         ROS  Cardiovascular: Heart murmur Pulmonary or Respiratory: Snoring  Neurological: No reported neurological signs or symptoms such as seizures, abnormal skin sensations, urinary and/or fecal incontinence, being born with an abnormal open spine and/or a tethered spinal cord Psychological-Psychiatric: Anxiousness Gastrointestinal: No reported gastrointestinal signs or symptoms such as vomiting or evacuating blood, reflux, heartburn, alternating episodes of diarrhea and constipation, inflamed or scarred liver, or pancreas or irrregular and/or infrequent bowel movements Genitourinary: No reported renal or genitourinary signs or symptoms such as difficulty voiding or producing urine, peeing blood, non-functioning kidney, kidney stones, difficulty emptying the bladder, difficulty controlling the flow of urine, or chronic kidney disease Hematological: No reported hematological signs or symptoms such as prolonged bleeding, low or poor functioning platelets, bruising or bleeding easily, hereditary bleeding problems, low energy levels due to low hemoglobin or being anemic Endocrine: No reported endocrine signs or symptoms such as high or low blood sugar, rapid heart rate due to high thyroid levels, obesity or weight gain due to slow thyroid or thyroid disease Rheumatologic: No reported rheumatological signs and symptoms such as fatigue, joint pain, tenderness, swelling, redness, heat, stiffness, decreased range of motion, with or without associated  rash Musculoskeletal: Negative for myasthenia gravis, muscular dystrophy, multiple sclerosis or malignant hyperthermia Work History: Working full time  Allergies  Omar Phillips is allergic to lipitor [atorvastatin].  Laboratory Chemistry Profile    Renal Lab Results  Component Value Date   BUN 15 04/07/2021   CREATININE 1.18 04/07/2021   LABCREA 188 10/20/2015   BCR 13 04/07/2021   GFRAA 79 01/23/2020   GFRNONAA 68 01/23/2020   SPECGRAV      >=1.030 (A) 04/07/2021   PHUR 5.5 04/07/2021   PROTEINUR Trace 04/07/2021     Electrolytes Lab Results  Component Value Date   NA 140 04/07/2021   K 4.8 04/07/2021   CL 102 04/07/2021   CALCIUM 9.9 04/07/2021     Hepatic Lab Results  Component Value Date   AST 25 04/07/2021   ALT 27 04/07/2021   ALBUMIN 4.8 04/07/2021   ALKPHOS 90 04/07/2021   LIPASE 24 07/25/2013     ID Lab Results  Component Value Date   HIV NON REACTIVE 04/11/2011     Bone Lab Results  Component Value Date   TESTOSTERONE 353 01/12/2017     Endocrine Lab Results  Component Value Date   GLUCOSE 98 04/07/2021   GLUCOSEU Negative 04/07/2021   HGBA1C 6.5 (H) 04/07/2021   TSH 0.62 10/20/2015   TESTOSTERONE 353 01/12/2017     Neuropathy Lab Results  Component Value Date   HGBA1C 6.5 (H) 04/07/2021   HIV NON REACTIVE 04/11/2011     CNS No results found for: "COLORCSF", "APPEARCSF", "RBCCOUNTCSF", "WBCCSF", "POLYSCSF", "LYMPHSCSF", "EOSCSF", "PROTEINCSF", "GLUCCSF", "JCVIRUS", "CSFOLI", "IGGCSF", "LABACHR", "ACETBL"   Inflammation (CRP: Acute  ESR: Chronic) No results found for: "CRP", "ESRSEDRATE", "LATICACIDVEN"   Rheumatology No results found for: "RF", "ANA", "LABURIC", "URICUR", "LYMEIGGIGMAB", "LYMEABIGMQN", "HLAB27"   Coagulation Lab Results  Component Value Date   PLT 300 04/07/2021     Cardiovascular Lab Results  Component Value Date   HGB 15.4 04/07/2021   HCT 45.2 04/07/2021     Screening Lab Results  Component Value Date   HIV NON REACTIVE 04/11/2011     Cancer No results found for: "CEA", "CA125", "LABCA2"   Allergens No results found for: "ALMOND", "APPLE", "ASPARAGUS", "AVOCADO", "BANANA", "BARLEY", "BASIL", "BAYLEAF", "GREENBEAN", "LIMABEAN", "WHITEBEAN",  "BEEFIGE", "REDBEET", "BLUEBERRY", "BROCCOLI", "CABBAGE", "MELON", "CARROT", "CASEIN", "CASHEWNUT", "CAULIFLOWER", "CELERY"     Note: Lab results reviewed.  PFSH  Drug: Omar Phillips  reports no history of drug use. Alcohol:  reports no history of alcohol use. Tobacco:  reports that he has never smoked. He has never used smokeless tobacco. Medical:  has a past medical history of Anxiety, Chest pain (2006), Dyslipidemia, GERD (gastroesophageal reflux disease), History of cardiovascular stress test (2006), and Hypertension. Family: family history includes Cancer in his maternal aunt; Diabetes in his brother and mother; Hypertension in his brother, brother, brother, mother, and sister; Hypothyroidism in his mother; Other in his father; Stroke in his maternal uncle.  Past Surgical History:  Procedure Laterality Date   LACERATION REPAIR     left forearm, remote past   TESTICLE TORSION REDUCTION     teenage years   Active Ambulatory Problems    Diagnosis Date Noted   Anxiety 12/07/2006   Essential hypertension 06/13/2012   Gastroesophageal reflux disease without esophagitis 10/20/2015   Encounter for health maintenance examination in adult 10/20/2015   Dyslipidemia 10/20/2015   Screening for prostate cancer 10/20/2015   Erectile dysfunction 01/08/2017   Vaccine counseling 01/08/2017   Work stress 04/02/2019  Chronic bilateral low back pain with sciatica 02/17/2022   Radicular pain of left lower extremity 02/17/2022   Spinal stenosis, lumbar region, with neurogenic claudication 02/17/2022   Abnormal MRI, lumbar spine 02/17/2022   Lumbar facet arthropathy 03/08/2022   Resolved Ambulatory Problems    Diagnosis Date Noted   Sciatica 04/05/2007   BACK PAIN 03/22/2007   Noncompliance 10/20/2015   Rash 10/20/2015   Need for Tdap vaccination 01/08/2017   Screen for colon cancer 01/08/2017   Low testosterone 05/29/2018   Other fatigue 05/29/2018   Impaired fasting glucose 05/29/2018    Stress due to illness of family member 04/02/2019   Dizziness 07/15/2019   Vaccine refused by patient 01/23/2020   Past Medical History:  Diagnosis Date   Chest pain 2006   GERD (gastroesophageal reflux disease)    History of cardiovascular stress test 2006   Hypertension    Constitutional Exam  General appearance: Well nourished, well developed, and well hydrated. In no apparent acute distress Vitals:   03/08/22 1029  BP: (!) 136/97  Pulse: 89  Temp: (!) 97.1 F (36.2 C)  TempSrc: Temporal  SpO2: 100%  Weight: 195 lb (88.5 kg)  Height: 6' (1.829 m)   BMI Assessment: Estimated body mass index is 26.45 kg/m as calculated from the following:   Height as of this encounter: 6' (1.829 m).   Weight as of this encounter: 195 lb (88.5 kg).  BMI interpretation table: BMI level Category Range association with higher incidence of chronic pain  <18 kg/m2 Underweight   18.5-24.9 kg/m2 Ideal body weight   25-29.9 kg/m2 Overweight Increased incidence by 20%  30-34.9 kg/m2 Obese (Class I) Increased incidence by 68%  35-39.9 kg/m2 Severe obesity (Class II) Increased incidence by 136%  >40 kg/m2 Extreme obesity (Class III) Increased incidence by 254%   Patient's current BMI Ideal Body weight  Body mass index is 26.45 kg/m. Ideal body weight: 77.6 kg (171 lb 1.2 oz) Adjusted ideal body weight: 81.9 kg (180 lb 10.3 oz)   BMI Readings from Last 4 Encounters:  03/08/22 26.45 kg/m  02/17/22 25.61 kg/m  02/16/22 25.77 kg/m  10/28/21 25.80 kg/m   Wt Readings from Last 4 Encounters:  03/08/22 195 lb (88.5 kg)  02/17/22 188 lb 12.8 oz (85.6 kg)  02/16/22 190 lb (86.2 kg)  10/28/21 190 lb 3.2 oz (86.3 kg)    Psych/Mental status: Alert, oriented x 3 (person, place, & time)       Eyes: PERLA Respiratory: No evidence of acute respiratory distress  Lumbar Spine Area Exam  Skin & Axial Inspection: No masses, redness, or swelling Alignment: Symmetrical Functional ROM: Unrestricted  ROM       Stability: No instability detected Muscle Tone/Strength: Functionally intact. No obvious neuro-muscular anomalies detected. Sensory (Neurological): Dermatomal pain pattern, neurogenic on the left L4-L5  Gait & Posture Assessment  Ambulation: Patient came in today in a wheel chair Gait: Antalgic gait (limping) Posture: Difficulty standing up straight, due to pain  Lower Extremity Exam    Side: Right lower extremity  Side: Left lower extremity  Stability: No instability observed          Stability: No instability observed          Skin & Extremity Inspection: Skin color, temperature, and hair growth are WNL. No peripheral edema or cyanosis. No masses, redness, swelling, asymmetry, or associated skin lesions. No contractures.  Skin & Extremity Inspection: Skin color, temperature, and hair growth are WNL. No peripheral edema or cyanosis.  No masses, redness, swelling, asymmetry, or associated skin lesions. No contractures.  Functional ROM: Unrestricted ROM                  Functional ROM: Unrestricted ROM                  Muscle Tone/Strength: Functionally intact. No obvious neuro-muscular anomalies detected.  Muscle Tone/Strength: Functionally intact. No obvious neuro-muscular anomalies detected.  Sensory (Neurological): Unimpaired        Sensory (Neurological): Neurogenic pain pattern        DTR: Patellar: deferred today Achilles: deferred today Plantar: deferred today  DTR: Patellar: deferred today Achilles: deferred today Plantar: deferred today  Palpation: No palpable anomalies  Palpation: No palpable anomalies   5 out of 5 strength bilateral lower extremity: Plantar flexion, dorsiflexion, knee flexion, knee extension.   Assessment  Primary Diagnosis & Pertinent Problem List: The primary encounter diagnosis was Spinal stenosis, lumbar region, with neurogenic claudication (L4/5). Diagnoses of Lumbar facet arthropathy and Radicular pain of left lower extremity were also  pertinent to this visit.  Visit Diagnosis (New problems to examiner): 1. Spinal stenosis, lumbar region, with neurogenic claudication (L4/5)   2. Lumbar facet arthropathy   3. Radicular pain of left lower extremity    Plan of Care (Initial workup plan)  General Recommendations: The pain condition that the patient suffers from is best treated with a multidisciplinary approach that involves an increase in physical activity to prevent de-conditioning and worsening of the pain cycle, as well as psychological counseling (formal and/or informal) to address the co-morbid psychological affects of pain. The goal of the multidisciplinary approach is to return the patient to a higher level of overall function and to restore their ability to perform activities of daily living.  1. Spinal stenosis, lumbar region, with neurogenic claudication (L4/5) - Ambulatory referral to Physical Therapy - Lumbar Epidural Injection; Future - gabapentin (NEURONTIN) 300 MG capsule; Take 1 capsule (300 mg total) by mouth 3 (three) times daily.  Dispense: 180 capsule; Refill: 2 - tiZANidine (ZANAFLEX) 4 MG tablet; Take 1 tablet (4 mg total) by mouth every 12 (twelve) hours as needed for muscle spasms.  Dispense: 60 tablet; Refill: 1  2. Lumbar facet arthropathy - Ambulatory referral to Physical Therapy - gabapentin (NEURONTIN) 300 MG capsule; Take 1 capsule (300 mg total) by mouth 3 (three) times daily.  Dispense: 180 capsule; Refill: 2 - tiZANidine (ZANAFLEX) 4 MG tablet; Take 1 tablet (4 mg total) by mouth every 12 (twelve) hours as needed for muscle spasms.  Dispense: 60 tablet; Refill: 1  3. Radicular pain of left lower extremity - Ambulatory referral to Physical Therapy - Lumbar Epidural Injection; Future - gabapentin (NEURONTIN) 300 MG capsule; Take 1 capsule (300 mg total) by mouth 3 (three) times daily.  Dispense: 180 capsule; Refill: 2 - tiZANidine (ZANAFLEX) 4 MG tablet; Take 1 tablet (4 mg total) by mouth every  12 (twelve) hours as needed for muscle spasms.  Dispense: 60 tablet; Refill: 1  Discontinue Robaxin.  Referral Orders         Ambulatory referral to Physical Therapy     Procedure Orders         Lumbar Epidural Injection     Pharmacotherapy (current): Medications ordered:  Meds ordered this encounter  Medications   gabapentin (NEURONTIN) 300 MG capsule    Sig: Take 1 capsule (300 mg total) by mouth 3 (three) times daily.    Dispense:  180 capsule  Refill:  2   tiZANidine (ZANAFLEX) 4 MG tablet    Sig: Take 1 tablet (4 mg total) by mouth every 12 (twelve) hours as needed for muscle spasms.    Dispense:  60 tablet    Refill:  1    Do not place this medication, or any other prescription from our practice, on "Automatic Refill". Patient may have prescription filled one day early if pharmacy is closed on scheduled refill date.   Medications administered during this visit: Omar Phillips had no medications administered during this visit.     Interventional management options: Omar Phillips was informed that there is no guarantee that he would be a candidate for interventional therapies. The decision will be based on the results of diagnostic studies, as well as Omar Phillips's risk profile.  Procedure(s) under consideration:  Mild Spinal cord stim trial    I spent a total of 60 minutes reviewing chart data, face-to-face evaluation with the patient, counseling and coordination of care as detailed above.   Provider-requested follow-up: Return in about 4 weeks (around 04/05/2022) for Left L4/5 ESI , in clinic (PO Valium).  No future appointments.   Note by: Gillis Santa, MD Date: 03/08/2022; Time: 1:51 PM

## 2022-03-08 NOTE — Patient Instructions (Signed)
Epidural Steroid Injection Patient Information  Description: The epidural space surrounds the nerves as they exit the spinal cord.  In some patients, the nerves can be compressed and inflamed by a bulging disc or a tight spinal canal (spinal stenosis).  By injecting steroids into the epidural space, we can bring irritated nerves into direct contact with a potentially helpful medication.  These steroids act directly on the irritated nerves and can reduce swelling and inflammation which often leads to decreased pain.  Epidural steroids may be injected anywhere along the spine and from the neck to the low back depending upon the location of your pain.   After numbing the skin with local anesthetic (like Novocaine), a small needle is passed into the epidural space slowly.  You may experience a sensation of pressure while this is being done.  The entire block usually last less than 10 minutes.  Conditions which may be treated by epidural steroids:  Low back and leg pain Neck and arm pain Spinal stenosis Post-laminectomy syndrome Herpes zoster (shingles) pain Pain from compression fractures  Preparation for the injection:  Do not eat any solid food or dairy products within 8 hours of your appointment.  You may drink clear liquids up to 3 hours before appointment.  Clear liquids include water, black coffee, juice or soda.  No milk or cream please. You may take your regular medication, including pain medications, with a sip of water before your appointment  Diabetics should hold regular insulin (if taken separately) and take 1/2 normal NPH dos the morning of the procedure.  Carry some sugar containing items with you to your appointment. A driver must accompany you and be prepared to drive you home after your procedure.  Bring all your current medications with your. An IV may be inserted and sedation may be given at the discretion of the physician.   A blood pressure cuff, EKG and other monitors will  often be applied during the procedure.  Some patients may need to have extra oxygen administered for a short period. You will be asked to provide medical information, including your allergies, prior to the procedure.  We must know immediately if you are taking blood thinners (like Coumadin/Warfarin)  Or if you are allergic to IV iodine contrast (dye). We must know if you could possible be pregnant.  Possible side-effects: Bleeding from needle site Infection (rare, may require surgery) Nerve injury (rare) Numbness & tingling (temporary) Difficulty urinating (rare, temporary) Spinal headache ( a headache worse with upright posture) Light -headedness (temporary) Pain at injection site (several days) Decreased blood pressure (temporary) Weakness in arm/leg (temporary) Pressure sensation in back/neck (temporary)  Call if you experience: Fever/chills associated with headache or increased back/neck pain. Headache worsened by an upright position. New onset weakness or numbness of an extremity below the injection site Hives or difficulty breathing (go to the emergency room) Inflammation or drainage at the infection site Severe back/neck pain Any new symptoms which are concerning to you  Please note:  Although the local anesthetic injected can often make your back or neck feel good for several hours after the injection, the pain will likely return.  It takes 3-7 days for steroids to work in the epidural space.  You may not notice any pain relief for at least that one week.  If effective, we will often do a series of three injections spaced 3-6 weeks apart to maximally decrease your pain.  After the initial series, we generally will wait several months before   considering a repeat injection of the same type.  If you have any questions, please call [phone] [facility]

## 2022-03-10 ENCOUNTER — Other Ambulatory Visit: Payer: Self-pay | Admitting: Medical

## 2022-03-20 ENCOUNTER — Telehealth: Payer: Self-pay | Admitting: Medical

## 2022-03-20 NOTE — Telephone Encounter (Signed)
Pt called and scheduled virtual follow up on Thursday ( first available appt) His paperwork has him going back to work on Wednesday, pt states he is not able to work but has to have note or something Before Wednesday  I told him that we would send you a note about the work issue and call him if we get earlier cancellation.

## 2022-03-21 NOTE — Telephone Encounter (Signed)
Called patient back and told him that he needs to go back to specialists for note for work. Patient states specialist have told him to go back to his doctor. Advised him again per Audelia Acton that he can not take him out any longer that he would need to consult with specialist about work note because they are the treating him for his pain. Audelia Acton has already recommend counseling for patient but he has not scheduled anything. He states he is going to call specialist and get them to call us and tell us that he needs to be out of work. I advised him that if they feel he can not work, they would need to take him out of work.

## 2022-03-23 ENCOUNTER — Encounter: Payer: Self-pay | Admitting: Medical

## 2022-03-23 ENCOUNTER — Telehealth: Payer: 59 | Admitting: Medical

## 2022-03-23 ENCOUNTER — Telehealth: Payer: Self-pay | Admitting: Medical

## 2022-03-23 VITALS — Ht 72.0 in | Wt 171.0 lb

## 2022-03-23 DIAGNOSIS — I10 Essential (primary) hypertension: Secondary | ICD-10-CM

## 2022-03-23 DIAGNOSIS — R937 Abnormal findings on diagnostic imaging of other parts of musculoskeletal system: Secondary | ICD-10-CM

## 2022-03-23 DIAGNOSIS — F419 Anxiety disorder, unspecified: Secondary | ICD-10-CM

## 2022-03-23 DIAGNOSIS — M5440 Lumbago with sciatica, unspecified side: Secondary | ICD-10-CM | POA: Diagnosis not present

## 2022-03-23 DIAGNOSIS — M4727 Other spondylosis with radiculopathy, lumbosacral region: Secondary | ICD-10-CM | POA: Diagnosis not present

## 2022-03-23 DIAGNOSIS — R634 Abnormal weight loss: Secondary | ICD-10-CM | POA: Insufficient documentation

## 2022-03-23 DIAGNOSIS — M47816 Spondylosis without myelopathy or radiculopathy, lumbar region: Secondary | ICD-10-CM

## 2022-03-23 DIAGNOSIS — G8929 Other chronic pain: Secondary | ICD-10-CM

## 2022-03-23 DIAGNOSIS — M5410 Radiculopathy, site unspecified: Secondary | ICD-10-CM

## 2022-03-23 NOTE — Telephone Encounter (Signed)
Pt's daughter came by and dropped off a copy of pt's fmla. Sending back to [place].

## 2022-03-23 NOTE — Progress Notes (Signed)
Subjective:     Patient ID: Omar Omar, male   DOB: September 20, 1965, 56 y.o.   MRN: [id number]  This visit type was conducted due to national recommendations for restrictions regarding the COVID-19 Pandemic (e.g. social distancing) in an effort to limit this patient's exposure and mitigate transmission in our community.  Due to their co-morbid illnesses, this patient is at least at moderate risk for complications without adequate follow up.  This format is felt to be most appropriate for this patient at this time.    Documentation for virtual audio and video telecommunications through [facility] encounter:  The patient was located at home. The provider was located in the office. The patient did consent to this visit and is aware of possible charges through their insurance for this visit.  The other persons participating in this telemedicine service were none Time spent on call was 20 minutes and in review of previous records 20 minutes total.  This virtual service is not related to other E/M service within previous 7 days.   HPI Chief Complaint  Patient presents with   OTHER    F/u on FMLA, sciatic nerve pain pressing on L5 and can't walk, been there for 20 some years, peeing in a cup because he can't get up to work, how is he supposed to work, neurologist said he needed to see his doctor, needs PT, couldn't go to PT because he couldn't walk, and management on 04/05/22. Lost 11 pounds because he is worried about his health and job situation.    Virtual for discussion of pain and recent visits to pain management initial consult and neurosurgery for 2nd opinion.  Since his last virtual visit with me he is seeing pain management for initial consult as well as neurosurgery for second opinion after seeing orthopedics.  He states that he cannot do physical therapy as he can barely walk right now due to the pain.  He is having to use a cup to urinate and as he has pain just going to the bathroom.   No fever.  No other new symptoms just the ongoing pains he has had for several weeks now.  He is under a lot of stress of being out of work and with the pain.  He does not want to be out of work.  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  He has had talk with supervisor about how long he may not need to be out of work due to his pain and treatment  He recently saw Dr. Arnoldo Morale, neurosurgery. He notes that with his discussion with neurosurgeon, surgery is likely both they required him to at least attempt physical therapy first.  He has his second visit with pain management on 04/05/2022, and they are supposed to work on his pain treatments and make a recommendation for time out of work or work restrictions at that time.  In the meantime he called a couple counselors, regarding stress and anxiety.  There are not a lot of available appointments right now given the holidays and people are backed up with schedules.  He is still trying to get an appointment time  No other aggravating or relieving factors. No other complaint.    Past Medical History:  Diagnosis Date   Anxiety    Chest pain 2006   hospital eval   Dyslipidemia    GERD (gastroesophageal reflux disease)  History of cardiovascular stress test 2006   [place]   Hypertension    Current Outpatient Medications on File Prior to Visit  Medication Sig Dispense Refill   gabapentin (NEURONTIN) 300 MG capsule Take 1 capsule (300 mg total) by mouth 3 (three) times daily. 180 capsule 2   Multiple Vitamin (MULTIVITAMIN) capsule Take 1 capsule by mouth daily.     Omega-3 Fatty Acids (FISH OIL PO) Take by mouth.     sildenafil (VIAGRA) 50 MG tablet TAKE 1 TABLET BY MOUTH AS NEEDED FOR ERECTILE DYSFUNCTION 10 tablet 0   tiZANidine (ZANAFLEX) 4 MG tablet Take 1 tablet (4 mg total) by mouth every 12 (twelve) hours as needed for muscle spasms. 60  tablet 1   valsartan (DIOVAN) 80 MG tablet Take 1 tablet by mouth once daily 90 tablet 0   No current facility-administered medications on file prior to visit.    Review of Systems As in subjective      Objective:   Physical Exam Due to coronavirus pandemic stay at home measures, patient visit was virtual and they were not examined in person.   Ht 6' (1.829 m)   Wt 171 lb (77.6 kg)   BMI 23.19 kg/m   Wt Readings from Last 3 Encounters:  03/23/22 171 lb (77.6 kg)  03/08/22 195 lb (88.5 kg)  02/17/22 188 lb 12.8 oz (85.6 kg)   Gen: wd, wn ,nad Psych: pleasant, good eye contact, answers questions appropriately      Assessment:     Encounter Diagnoses  Name Primary?   Chronic bilateral low back pain with sciatica, sciatica laterality unspecified Yes   Essential hypertension    Lumbar facet arthropathy    Abnormal MRI, lumbar spine    Anxiety    Radicular pain of left lower extremity    Weight loss        Plan:     I reviewed over his 03/01/22 orthopedic notes from Dr. Mina Marble, his recent visit with Dr. Holley Raring pain management from 03/08/22, neurosurgery consult note from 03/17/22  His neurosurgery consult note was a second opinion visit.  His main concern today is protecting his job and FMLA paperwork.  I advised I will only write him out through 04/05/2022 to give him time to get in with pain management for his second visit which would start his care with them.  His first visit with a get to know you initial consult visit so I did not take over treatment plan or FMLA paperwork at that time.  I advised to go ahead and get in with counseling for stress and anxiety concerns in general.  We discussed that he may have to look in a leave of absence depending on how long he might be at work with this issue with his back.  He will be having epidural steroid injection soon.  There is still the possibly of surgery coming up.  I made it clear that I cannot write him out of work after  04/05/2022 as this would need to be through his pain management or neurosurgery or orthopedic office.  If any worse symptoms in the meantime go to the emergency department.  Continue current medications, Robaxin, gabapentin, NSAIDs.  I will await his FMLA paperwork. 1st day out of work, 02/13/22   Omar Phillips was seen today for other.  Diagnoses and all orders for this visit:  Chronic bilateral low back pain with sciatica, sciatica laterality unspecified  Essential hypertension  Lumbar facet arthropathy  Abnormal MRI,  lumbar spine  Anxiety  Radicular pain of left lower extremity  Weight loss   F/u with pain management

## 2022-03-27 ENCOUNTER — Telehealth: Payer: Self-pay | Admitting: Internal Medicine

## 2022-03-27 NOTE — Telephone Encounter (Signed)
Received [employer] forms. Putting in your folder to fill out

## 2022-03-28 NOTE — Progress Notes (Signed)
Gabriel Cirri said FMLA papers came in yesterday,  do you still need a work note,  I will not be here tomorrow, please send to Gabriel Cirri if one is needed.

## 2022-04-05 ENCOUNTER — Ambulatory Visit
Admission: RE | Admit: 2022-04-05 | Discharge: 2022-04-05 | Disposition: A | Discharge status: Home / Self Care | Payer: 59 | Source: Ambulatory Visit | Attending: Student in an Organized Health Care Education/Training Program | Admitting: Student in an Organized Health Care Education/Training Program

## 2022-04-05 ENCOUNTER — Encounter: Payer: Self-pay | Admitting: Student in an Organized Health Care Education/Training Program

## 2022-04-05 ENCOUNTER — Ambulatory Visit
Payer: 59 | Attending: Student in an Organized Health Care Education/Training Program | Admitting: Student in an Organized Health Care Education/Training Program

## 2022-04-05 DIAGNOSIS — M48062 Spinal stenosis, lumbar region with neurogenic claudication: Secondary | ICD-10-CM | POA: Diagnosis not present

## 2022-04-05 DIAGNOSIS — M79662 Pain in left lower leg: Secondary | ICD-10-CM | POA: Diagnosis present

## 2022-04-05 DIAGNOSIS — M5410 Radiculopathy, site unspecified: Secondary | ICD-10-CM | POA: Diagnosis not present

## 2022-04-05 MED ORDER — ROPIVACAINE HCL 2 MG/ML IJ SOLN
2.0000 mL | Freq: Once | INTRAMUSCULAR | Status: AC
  Administered 2022-04-05: 2 mL via EPIDURAL
  Filled 2022-04-05: qty 20

## 2022-04-05 MED ORDER — SODIUM CHLORIDE (PF) 0.9 % IJ SOLN
INTRAMUSCULAR | Status: AC
  Filled 2022-04-05: qty 10

## 2022-04-05 MED ORDER — DIAZEPAM 5 MG PO TABS
5.0000 mg | ORAL_TABLET | ORAL | Status: AC
  Administered 2022-04-05: 5 mg via ORAL

## 2022-04-05 MED ORDER — DEXAMETHASONE SODIUM PHOSPHATE 10 MG/ML IJ SOLN
10.0000 mg | Freq: Once | INTRAMUSCULAR | Status: AC
  Administered 2022-04-05: 10 mg
  Filled 2022-04-05: qty 1

## 2022-04-05 MED ORDER — SODIUM CHLORIDE 0.9% FLUSH
2.0000 mL | Freq: Once | INTRAVENOUS | Status: AC
  Administered 2022-04-05: 2 mL

## 2022-04-05 MED ORDER — IOHEXOL 180 MG/ML  SOLN
10.0000 mL | Freq: Once | INTRAMUSCULAR | Status: AC
  Administered 2022-04-05: 10 mL via EPIDURAL
  Filled 2022-04-05: qty 20

## 2022-04-05 MED ORDER — DIAZEPAM 5 MG PO TABS
ORAL_TABLET | ORAL | Status: AC
  Filled 2022-04-05: qty 1

## 2022-04-05 NOTE — Patient Instructions (Signed)

## 2022-04-05 NOTE — Progress Notes (Signed)
PROVIDER NOTE: Interpretation of information contained herein should be left to medically-trained personnel. Specific patient instructions are provided elsewhere under "Patient Instructions" section of medical record. This document was created in part using STT-dictation technology, any transcriptional errors that may result from this process are unintentional.  Patient: Omar Phillips Type: Established DOB: Nov 12, 1965 MRN: [id number] PCP: Carlena Hurl, PA-C  Service: Procedure DOS: 04/05/2022 Setting: Ambulatory Location: Ambulatory outpatient facility Delivery: Face-to-face Provider: Gillis Santa, MD Specialty: Interventional Pain Management Specialty designation: 09 Location: Outpatient facility Ref. Prov.: Gillis Santa, MD    Primary Reason for Visit: Interventional Pain Management Treatment. CC: Back Pain (lower)   Procedure:           Type: Lumbar epidural steroid injection (LESI) (interlaminar) #1    Laterality: Left   Level:  L4-5 Level.  Imaging: Fluoroscopic guidance         Anesthesia: Local anesthesia (1-2% Lidocaine) Sedation: 5 mg p.o. Valium DOS: 04/05/2022  Performed by: Gillis Santa, MD  Purpose: Diagnostic/Therapeutic Indications: Lumbar radicular pain of intraspinal etiology of more than 4 weeks that has failed to respond to conservative therapy and is severe enough to impact quality of life or function. 1. Spinal stenosis, lumbar region, with neurogenic claudication (L4/5)   2. Radicular pain of left lower extremity    NAS-11 Pain score:   Pre-procedure: 10-Worst pain ever/10   Post-procedure: 8 /10      Position / Prep / Materials:  Position: Prone w/ head of the table raised (slight reverse trendelenburg) to facilitate breathing.  Prep solution: DuraPrep (Iodine Povacrylex [0.7% available iodine] and Isopropyl Alcohol, 74% w/w) Prep Area: Entire Posterior Lumbar Region from lower scapular tip down to mid buttocks area and from flank to  flank. Materials:  Tray: Epidural tray Needle(s):  Type: Epidural needle (Tuohy) Gauge (G):  22 Length: Regular (3.5-in) Qty: 1  Pre-op H&P Assessment:  [name] is a 56 y.o. (year old), male patient, seen today for interventional treatment. He  has a past surgical history that includes Testicle torsion reduction and Laceration repair. [name] has a current medication list which includes the following prescription(s): gabapentin, multivitamin, omega-3 fatty acids, sildenafil, tizanidine, and valsartan. His primarily concern today is the Back Pain (lower)  Initial Vital Signs:  Pulse/HCG Rate: 69ECG Heart Rate: 78 Temp: (!) 97.4 F (36.3 C) Resp: 16 BP: (!) 150/93 SpO2: 98 %  BMI: Estimated body mass index is 23.19 kg/m as calculated from the following:   Height as of this encounter: 6' (1.829 m).   Weight as of this encounter: 171 lb (77.6 kg).  Risk Assessment: Allergies: Reviewed. He is allergic to lipitor [atorvastatin].  Allergy Precautions: None required Coagulopathies: Reviewed. None identified.  Blood-thinner therapy: None at this time Active Infection(s): Reviewed. None identified. [name] is afebrile  Site Confirmation: [name] was asked to confirm the procedure and laterality before marking the site Procedure checklist: Completed Consent: Before the procedure and under the influence of no sedative(s), amnesic(s), or anxiolytics, the patient was informed of the treatment options, risks and possible complications. To fulfill our ethical and legal obligations, as recommended by the American Medical Association's Code of Ethics, I have informed the patient of my clinical impression; the nature and purpose of the treatment or procedure; the risks, benefits, and possible complications of the intervention; the alternatives, including doing nothing; the risk(s) and benefit(s) of the alternative treatment(s) or procedure(s); and the risk(s) and benefit(s) of doing  nothing. The patient was provided information  about the general risks and possible complications associated with the procedure. These may include, but are not limited to: failure to achieve desired goals, infection, bleeding, organ or nerve damage, allergic reactions, paralysis, and death. In addition, the patient was informed of those risks and complications associated to Spine-related procedures, such as failure to decrease pain; infection (i.e.: Meningitis, epidural or intraspinal abscess); bleeding (i.e.: epidural hematoma, subarachnoid hemorrhage, or any other type of intraspinal or peri-dural bleeding); organ or nerve damage (i.e.: Any type of peripheral nerve, nerve root, or spinal cord injury) with subsequent damage to sensory, motor, and/or autonomic systems, resulting in permanent pain, numbness, and/or weakness of one or several areas of the body; allergic reactions; (i.e.: anaphylactic reaction); and/or death. Furthermore, the patient was informed of those risks and complications associated with the medications. These include, but are not limited to: allergic reactions (i.e.: anaphylactic or anaphylactoid reaction(s)); adrenal axis suppression; blood sugar elevation that in diabetics may result in ketoacidosis or comma; water retention that in patients with history of congestive heart failure may result in shortness of breath, pulmonary edema, and decompensation with resultant heart failure; weight gain; swelling or edema; medication-induced neural toxicity; particulate matter embolism and blood vessel occlusion with resultant organ, and/or nervous system infarction; and/or aseptic necrosis of one or more joints. Finally, the patient was informed that Medicine is not an exact science; therefore, there is also the possibility of unforeseen or unpredictable risks and/or possible complications that may result in a catastrophic outcome. The patient indicated having understood very clearly. We have given  the patient no guarantees and we have made no promises. Enough time was given to the patient to ask questions, all of which were answered to the patient's satisfaction. [name] has indicated that he wanted to continue with the procedure. Attestation: I, the ordering provider, attest that I have discussed with the patient the benefits, risks, side-effects, alternatives, likelihood of achieving goals, and potential problems during recovery for the procedure that I have provided informed consent. Date  Time: 04/05/2022  8:19 AM  Pre-Procedure Preparation:  Monitoring: As per clinic protocol. Respiration, ETCO2, SpO2, BP, heart rate and rhythm monitor placed and checked for adequate function Safety Precautions: Patient was assessed for positional comfort and pressure points before starting the procedure. Time-out: I initiated and conducted the "Time-out" before starting the procedure, as per protocol. The patient was asked to participate by confirming the accuracy of the "Time Out" information. Verification of the correct person, site, and procedure were performed and confirmed by me, the nursing staff, and the patient. "Time-out" conducted as per Joint Commission's Universal Protocol (UP.01.01.01). Time: 0917  Description/Narrative of Procedure:          Target: Epidural space via interlaminar opening, initially targeting the lower laminar border of the superior vertebral body. Region: Lumbar Approach: Percutaneous paravertebral  Rationale (medical necessity): procedure needed and proper for the diagnosis and/or treatment of the patient's medical symptoms and needs. Procedural Technique Safety Precautions: Aspiration looking for blood return was conducted prior to all injections. At no point did we inject any substances, as a needle was being advanced. No attempts were made at seeking any paresthesias. Safe injection practices and needle disposal techniques used. Medications properly checked for  expiration dates. SDV (single dose vial) medications used. Description of the Procedure: Protocol guidelines were followed. The procedure needle was introduced through the skin, ipsilateral to the reported pain, and advanced to the target area. Bone was contacted and the needle walked caudad, until the  lamina was cleared. The epidural space was identified using "loss-of-resistance technique" with 2-3 ml of PF-NaCl (0.9% NSS), in a 5cc LOR glass syringe.  6 cc solution made of 3cc of preservative-free saline, 2 cc of 0.2% ropivacaine, 1 cc of Decadron 10 mg/cc.   Vitals:   04/05/22 0917 04/05/22 0923 04/05/22 0927 04/05/22 0936  BP: (!) 142/106 (!) 155/109 (!) 138/106 (!) 143/99  Pulse:      Resp: 16 18 (!) 22   Temp:      TempSrc:      SpO2: 96% 97% 98%   Weight:      Height:        Start Time: 0917 hrs. End Time: 0927 hrs.  Imaging Guidance (Spinal):          Type of Imaging Technique: Fluoroscopy Guidance (Spinal) Indication(s): Assistance in needle guidance and placement for procedures requiring needle placement in or near specific anatomical locations not easily accessible without such assistance. Exposure Time: Please see nurses notes. Contrast: Before injecting any contrast, we confirmed that the patient did not have an allergy to iodine, shellfish, or radiological contrast. Once satisfactory needle placement was completed at the desired level, radiological contrast was injected. Contrast injected under live fluoroscopy. No contrast complications. See chart for type and volume of contrast used. Fluoroscopic Guidance: I was personally present during the use of fluoroscopy. "Tunnel Vision Technique" used to obtain the best possible view of the target area. Parallax error corrected before commencing the procedure. "Direction-depth-direction" technique used to introduce the needle under continuous pulsed fluoroscopy. Once target was reached, antero-posterior, oblique, and lateral  fluoroscopic projection used confirm needle placement in all planes. Images permanently stored in EMR. Interpretation: I personally interpreted the imaging intraoperatively. Adequate needle placement confirmed in multiple planes. Appropriate spread of contrast into desired area was observed. No evidence of afferent or efferent intravascular uptake. No intrathecal or subarachnoid spread observed. Permanent images saved into the patient's record.  Antibiotic Prophylaxis:   Anti-infectives (From admission, onward)    None      Indication(s): None identified  Post-operative Assessment:  Post-procedure Vital Signs:  Pulse/HCG Rate: 6984 Temp: (!) 97.4 F (36.3 C) Resp: (!) 22 BP: (!) 143/99 SpO2: 98 %  EBL: None  Complications: No immediate post-treatment complications observed by team, or reported by patient.  Note: The patient tolerated the entire procedure well. A repeat set of vitals were taken after the procedure and the patient was kept under observation following institutional policy, for this type of procedure. Post-procedural neurological assessment was performed, showing return to baseline, prior to discharge. The patient was provided with post-procedure discharge instructions, including a section on how to identify potential problems. Should any problems arise concerning this procedure, the patient was given instructions to immediately contact us, at any time, without hesitation. In any case, we plan to contact the patient by telephone for a follow-up status report regarding this interventional procedure.  Comments:  No additional relevant information.  Plan of Care  Orders:  Orders Placed This Encounter  Procedures   DG PAIN CLINIC C-ARM 1-60 MIN NO REPORT    Intraoperative interpretation by procedural physician at [facility].    Standing Status:   Standing    Number of Occurrences:   1    Order Specific Question:   Reason for exam:    Answer:   Assistance in  needle guidance and placement for procedures requiring needle placement in or near specific anatomical locations not easily accessible without such  assistance.     Medications ordered for procedure: Meds ordered this encounter  Medications   iohexol (OMNIPAQUE) 180 MG/ML injection 10 mL    Must be Myelogram-compatible. If not available, you may substitute with a water-soluble, non-ionic, hypoallergenic, myelogram-compatible radiological contrast medium.   ropivacaine (PF) 2 mg/mL (0.2%) (NAROPIN) injection 2 mL   sodium chloride flush (NS) 0.9 % injection 2 mL   dexamethasone (DECADRON) injection 10 mg   diazepam (VALIUM) tablet 5 mg    Make sure Flumazenil is available in the pyxis when using this medication. If oversedation occurs, administer 0.2 mg IV over 15 sec. If after 45 sec no response, administer 0.2 mg again over 1 min; may repeat at 1 min intervals; not to exceed 4 doses (1 mg)   Medications administered: We administered iohexol, ropivacaine (PF) 2 mg/mL (0.2%), sodium chloride flush, dexamethasone, and diazepam.  See the medical record for exact dosing, route, and time of administration.  Follow-up plan:   Return in about 4 weeks (around 05/03/2022) for Post Procedure Evaluation, in person (consider MILD).       Left L4/5 ESI 04/05/22   Recent Visits Date Type Provider Dept  03/08/22 Office Visit Gillis Santa, MD [facility]  Showing recent visits within past 90 days and meeting all other requirements Today's Visits Date Type Provider Dept  04/05/22 Procedure visit Gillis Santa, MD [facility]  Showing today's visits and meeting all other requirements Future Appointments Date Type Provider Dept  05/03/22 Appointment Gillis Santa, MD [facility]  Showing future appointments within next 90 days and meeting all other requirements  Disposition: Discharge home  Discharge (Date  Time): 04/05/2022; 0937 hrs.   Primary Care Physician:  Caryl Ada Location: [facility] Note by: Gillis Santa, MD Date: 04/05/2022; Time: 10:10 AM  Disclaimer:  Medicine is not an exact science. The only guarantee in medicine is that nothing is guaranteed. It is important to note that the decision to proceed with this intervention was based on the information collected from the patient. The Data and conclusions were drawn from the patient's questionnaire, the interview, and the physical examination. Because the information was provided in large part by the patient, it cannot be guaranteed that it has not been purposely or unconsciously manipulated. Every effort has been made to obtain as much relevant data as possible for this evaluation. It is important to note that the conclusions that lead to this procedure are derived in large part from the available data. Always take into account that the treatment will also be dependent on availability of resources and existing treatment guidelines, considered by other Pain Management Practitioners as being common knowledge and practice, at the time of the intervention. For Medico-Legal purposes, it is also important to point out that variation in procedural techniques and pharmacological choices are the acceptable norm. The indications, contraindications, technique, and results of the above procedure should only be interpreted and judged by a Board-Certified Interventional Pain Specialist with extensive familiarity and expertise in the same exact procedure and technique.

## 2022-04-06 ENCOUNTER — Encounter: Payer: Self-pay | Admitting: Medical

## 2022-04-06 ENCOUNTER — Telehealth: Payer: 59 | Admitting: Medical

## 2022-04-06 VITALS — BP 150/99 | Ht 72.0 in | Wt 175.0 lb

## 2022-04-06 DIAGNOSIS — M48062 Spinal stenosis, lumbar region with neurogenic claudication: Secondary | ICD-10-CM

## 2022-04-06 DIAGNOSIS — I10 Essential (primary) hypertension: Secondary | ICD-10-CM | POA: Diagnosis not present

## 2022-04-06 DIAGNOSIS — F419 Anxiety disorder, unspecified: Secondary | ICD-10-CM | POA: Diagnosis not present

## 2022-04-06 DIAGNOSIS — R937 Abnormal findings on diagnostic imaging of other parts of musculoskeletal system: Secondary | ICD-10-CM | POA: Diagnosis not present

## 2022-04-06 DIAGNOSIS — M47816 Spondylosis without myelopathy or radiculopathy, lumbar region: Secondary | ICD-10-CM

## 2022-04-06 DIAGNOSIS — M5440 Lumbago with sciatica, unspecified side: Secondary | ICD-10-CM

## 2022-04-06 DIAGNOSIS — G8929 Other chronic pain: Secondary | ICD-10-CM

## 2022-04-06 DIAGNOSIS — M5410 Radiculopathy, site unspecified: Secondary | ICD-10-CM

## 2022-04-06 NOTE — Telephone Encounter (Signed)
Called for post procedure check. No answer. LVM. 

## 2022-04-06 NOTE — Progress Notes (Signed)
Subjective:     Patient ID: Omar Omar, male   DOB: 23-Nov-1965, 56 y.o.   MRN: [id number]  This visit type was conducted due to national recommendations for restrictions regarding the COVID-19 Pandemic (e.g. social distancing) in an effort to limit this patient's exposure and mitigate transmission in our community.  Due to their co-morbid illnesses, this patient is at least at moderate risk for complications without adequate follow up.  This format is felt to be most appropriate for this patient at this time.    Documentation for virtual audio and video telecommunications through [place] encounter:  The patient was located at home. The provider was located in the office. The patient did consent to this visit and is aware of possible charges through their insurance for this visit.  The other persons participating in this telemedicine service were none Time spent on call was 20 minutes and in review of previous records 20 minutes total.  This virtual service is not related to other E/M service within previous 7 days.   HPI Chief Complaint  Patient presents with   other    Update on other doctor's, pain management yesterday, chiropractors today,  started PT last week,    Virtual consult.  Saw pain management yesterday, had medication injection yesterday.  Was advised that it may take 14 days for medication benefit to kick in.  BP was elevated at the visit.  He talked to pain management about the FMLA, and he notes specialist advised that he doesn't normally write FMLA until he has been seeing patient a year or more.   Seeing chiropractor 2 times per week as well.  Saw them this morning. Seeing massage therapist as well. Just started physical therapist last week.  Prior had xray on spine, was advised by the chiropractor that if they didn't give treatment for the curved spine, that surgery still may not resolve symptoms without correcting the curved spine issue with chiropractor.   He  notes that his personal goal is to have improvement by end of month.  Recently pain management wrote FMLA through end of January.   Mentally doing better if feeling better with pain.  Pain varies from day to day.   Taking 600 mg gabapentin TID, ibuprofen  Seeing PT at [facility] in [place].  Seeing a massage therapist in [place], [place] on recommendation by a friend, getting deep tissue massage.  Seeing [facility] on [address].     No other aggravating or relieving factors. No other complaint.    Past Medical History:  Diagnosis Date   Anxiety    Chest pain 2006   hospital eval   Dyslipidemia    GERD (gastroesophageal reflux disease)    History of cardiovascular stress test 2006   [facility]   Hypertension    Current Outpatient Medications on File Prior to Visit  Medication Sig Dispense Refill   gabapentin (NEURONTIN) 300 MG capsule Take 1 capsule (300 mg total) by mouth 3 (three) times daily. (Patient taking differently: Take 600 mg by mouth 3 (three) times daily.) 180 capsule 2   Multiple Vitamin (MULTIVITAMIN) capsule Take 1 capsule by mouth daily.     Omega-3 Fatty Acids (FISH OIL PO) Take by mouth.     sildenafil (VIAGRA) 50 MG tablet TAKE 1 TABLET BY MOUTH AS NEEDED FOR ERECTILE DYSFUNCTION 10 tablet 0   tiZANidine (ZANAFLEX) 4 MG tablet Take 1 tablet (4 mg total) by mouth every 12 (twelve) hours as needed for muscle spasms. Clint  tablet 1   valsartan (DIOVAN) 80 MG tablet Take 1 tablet by mouth once daily 90 tablet 0   No current facility-administered medications on file prior to visit.    Review of Systems As in subjective      Objective:   Physical Exam Due to coronavirus pandemic stay at home measures, patient visit was virtual and they were not examined in person.    BP (!) 150/99   Ht 6' (1.829 m)   Wt 175 lb (79.4 kg)   BMI 23.73 kg/m   Wt Readings from Last 3 Encounters:  04/06/22 175 lb (79.4 kg)  04/05/22 171 lb (77.6 kg)  03/23/22 171 lb  (77.6 kg)   Gen: wd, wn ,nad      Assessment:     Encounter Diagnoses  Name Primary?   Essential hypertension Yes   Anxiety    Abnormal MRI, lumbar spine    Lumbar facet arthropathy    Chronic bilateral low back pain with sciatica, sciatica laterality unspecified    Radicular pain of left lower extremity    Spinal stenosis, lumbar region, with neurogenic claudication        Plan:     I reviewed his pain management clinic note from yesterday, I reviewed back over his other notes from orthopedics from November 2023 and neurosurgery consult notes from December 2023.  I had completed his FMLA recently through the April 05, 2022 date.  He notes that pain management did update an FMLA yesterday through the end of January.  I reminded him that since I am not managing his pain it is hard for me to comment on how long he needs to be out of work for what restrictions he may need which is why this really should be coming from the people managing his pain.  As it gets towards the end of January, if need be we can communicate with pain management about the FMLA.  Overall he feels like he has a plan in place with the treatments he is getting.  I reminded him to make sure he makes his pain management person aware of his other treatment through chiropractic, physical therapy and massage therapy.   Omar Phillips was seen today for other.  Diagnoses and all orders for this visit:  Essential hypertension  Anxiety  Abnormal MRI, lumbar spine  Lumbar facet arthropathy  Chronic bilateral low back pain with sciatica, sciatica laterality unspecified  Radicular pain of left lower extremity  Spinal stenosis, lumbar region, with neurogenic claudication   F/u with pain management

## 2022-04-14 NOTE — Progress Notes (Signed)
Omar Phillips has this been resolved?

## 2022-04-19 ENCOUNTER — Ambulatory Visit: Payer: 59 | Admitting: Student in an Organized Health Care Education/Training Program

## 2022-04-26 ENCOUNTER — Encounter: Payer: Self-pay | Admitting: Medical

## 2022-04-26 ENCOUNTER — Telehealth: Payer: 59 | Admitting: Medical

## 2022-04-26 VITALS — Wt 190.0 lb

## 2022-04-26 DIAGNOSIS — R937 Abnormal findings on diagnostic imaging of other parts of musculoskeletal system: Secondary | ICD-10-CM | POA: Diagnosis not present

## 2022-04-26 DIAGNOSIS — M5410 Radiculopathy, site unspecified: Secondary | ICD-10-CM

## 2022-04-26 DIAGNOSIS — M5440 Lumbago with sciatica, unspecified side: Secondary | ICD-10-CM

## 2022-04-26 DIAGNOSIS — G8929 Other chronic pain: Secondary | ICD-10-CM

## 2022-04-26 DIAGNOSIS — M48062 Spinal stenosis, lumbar region with neurogenic claudication: Secondary | ICD-10-CM

## 2022-04-26 DIAGNOSIS — F419 Anxiety disorder, unspecified: Secondary | ICD-10-CM | POA: Diagnosis not present

## 2022-04-26 NOTE — Progress Notes (Signed)
Subjective:     Patient ID: Omar Phillips, male   DOB: 1965/06/01, 57 y.o.   MRN: [id number]  This visit type was conducted due to national recommendations for restrictions regarding the COVID-19 Pandemic (e.g. social distancing) in an effort to limit this patient's exposure and mitigate transmission in our community.  Due to their co-morbid illnesses, this patient is at least at moderate risk for complications without adequate follow up.  This format is felt to be most appropriate for this patient at this time.    Documentation for virtual audio and video telecommunications through [place] encounter:  The patient was located at home. The provider was located in the office. The patient did consent to this visit and is aware of possible charges through their insurance for this visit.  The other persons participating in this telemedicine service were none Time spent on call was 20 minutes and in review of previous records 20 minutes total.  This virtual service is not related to other E/M service within previous 7 days.   HPI Chief Complaint  Patient presents with   other    F/u update, wants to talk to you about who's going to be writing him out of working, 04/05/22 injection in spine doing pretty good, PT and deep tissue massage helping he would like to return to work by the end of the month with restrictions,    Virtual for update on pain and issues.   Seeing massage therapy and physical therapist regularly.  Hasn't seen pain management since 04/05/22, can't get back in with him any sooner.  Has appt coming up.  Still having limitations, can't walk for very long distances.  Is seeing some improvements with physical therapy, now in 4th week.  Taking gabapentin '600mg'$  TID, muscle relaxer as needed.  Gets some side effects with Gabapentin.   Sometimes gets dizzy with gabapentin, sometimes a little weird feeling on Gabapentin, can't drive while on this medication TID. Gabapentin makes him  sleepy.  He wants to try to get back to work end of this month with some restrictions.   His job does require a lot of walking.    Having some pulling sensation in thighs.  Gets some intermittent tingling in legs.  Still having low back pain.  Still using cane some to help.  Mentally doing better if feeling better with pain.  Pain varies from day to day.     Seeing PT at [facility] in [place].  Seeing a massage therapist in [place], [place] on recommendation by a friend, getting deep tissue massage.  Seeing Omar Phillips on [address].    Had EDSI injection 04/05/22.  Feels like he is maybe 60% improved, particularly since he could barely walk a month ago with the onset of pain.  Wants to be 70% improved before going back to work.   No other aggravating or relieving factors. No other complaint.   Past Medical History:  Diagnosis Date   Anxiety    Chest pain 2006   hospital eval   Dyslipidemia    GERD (gastroesophageal reflux disease)    History of cardiovascular stress test 2006   [facility]   Hypertension    Current Outpatient Medications on File Prior to Visit  Medication Sig Dispense Refill   gabapentin (NEURONTIN) 300 MG capsule Take 1 capsule (300 mg total) by mouth 3 (three) times daily. (Patient taking differently: Take 600 mg by mouth 3 (three) times daily.) 180 capsule 2   Multiple Vitamin (MULTIVITAMIN) capsule  Take 1 capsule by mouth daily.     Omega-3 Fatty Acids (FISH OIL PO) Take by mouth.     sildenafil (VIAGRA) 50 MG tablet TAKE 1 TABLET BY MOUTH AS NEEDED FOR ERECTILE DYSFUNCTION 10 tablet 0   tiZANidine (ZANAFLEX) 4 MG tablet Take 1 tablet (4 mg total) by mouth every 12 (twelve) hours as needed for muscle spasms. 60 tablet 1   valsartan (DIOVAN) 80 MG tablet Take 1 tablet by mouth once daily 90 tablet 0   No current facility-administered medications on file prior to visit.    Review of Systems As in subjective      Objective:   Physical Exam Due  to coronavirus pandemic stay at home measures, patient visit was virtual and they were not examined in person.    Wt 190 lb (86.2 kg)   BMI 25.77 kg/m   Wt Readings from Last 3 Encounters:  04/26/22 190 lb (86.2 kg)  04/06/22 175 lb (79.4 kg)  04/05/22 171 lb (77.6 kg)   Gen: wd, wn ,nad      Assessment:     Encounter Diagnoses  Name Primary?   Chronic bilateral low back pain with sciatica, sciatica laterality unspecified Yes   Abnormal MRI, lumbar spine    Anxiety    Radicular pain of left lower extremity    Spinal stenosis, lumbar region, with neurogenic claudication        Plan:     Discussed his progress. He feels 60% improvement so far.  He is continuing PT, massage, and chiropractor therapy.  Consider repeat EDSI.  He has f/u with pain management in the next 2 weeks.    I reviewed his pain management notes, orthopedic notes from November 2023 and neurosurgery consult notes from December 2023.  Current plan is recheck in 2 weeks or so before end of January to decide on work restrictions, progress, next steps.  Discussed red flag symptom that would prompt immediate recheck or possible surgery.  At his last neurosurgery appt, it was left open in turns of return visit.  The goal was to see how he progresses with non surgical therapy.  Omar Phillips was seen today for other.  Diagnoses and all orders for this visit:  Chronic bilateral low back pain with sciatica, sciatica laterality unspecified  Abnormal MRI, lumbar spine  Anxiety  Radicular pain of left lower extremity  Spinal stenosis, lumbar region, with neurogenic claudication   F/u with pain management

## 2022-05-03 ENCOUNTER — Ambulatory Visit
Payer: 59 | Attending: Student in an Organized Health Care Education/Training Program | Admitting: Student in an Organized Health Care Education/Training Program

## 2022-05-03 ENCOUNTER — Encounter: Payer: Self-pay | Admitting: Student in an Organized Health Care Education/Training Program

## 2022-05-03 VITALS — BP 148/95 | HR 72 | Temp 97.2°F | Resp 18 | Ht 72.0 in | Wt 189.0 lb

## 2022-05-03 DIAGNOSIS — M5410 Radiculopathy, site unspecified: Secondary | ICD-10-CM | POA: Insufficient documentation

## 2022-05-03 DIAGNOSIS — G894 Chronic pain syndrome: Secondary | ICD-10-CM | POA: Diagnosis present

## 2022-05-03 DIAGNOSIS — M48062 Spinal stenosis, lumbar region with neurogenic claudication: Secondary | ICD-10-CM | POA: Insufficient documentation

## 2022-05-03 MED ORDER — DULOXETINE HCL 20 MG PO CPEP: ORAL_CAPSULE | ORAL | 0 refills | Status: DC

## 2022-05-03 NOTE — Progress Notes (Signed)
Safety precautions to be maintained throughout the outpatient stay will include: orient to surroundings, keep bed in low position, maintain call bell within reach at all times, provide assistance with transfer out of bed and ambulation.  

## 2022-05-03 NOTE — Progress Notes (Signed)
PROVIDER NOTE: Information contained herein reflects review and annotations entered in association with encounter. Interpretation of such information and data should be left to medically-trained personnel. Information provided to patient can be located elsewhere in the medical record under "Patient Instructions". Document created using STT-dictation technology, any transcriptional errors that may result from process are unintentional.    Patient: Omar Phillips  Service Category: E/M  Provider: Gillis Santa, MD  DOB: 1966-03-23  DOS: 05/03/2022  Referring Provider: Caryl Ada  MRN: [id number]  Specialty: Interventional Pain Management  PCP: Carlena Hurl, PA-C  Type: Established Patient  Setting: Ambulatory outpatient    Location: Office  Delivery: Face-to-face     HPI  [name], a 57 y.o. year old male, is here today because of his Spinal stenosis, lumbar region, with neurogenic claudication [M48.062]. [name] primary complain today is Leg Pain (From left hip to ankle) Last encounter: My last encounter with him was on 04/05/2022. Pertinent problems: [name] does not have any pertinent problems on file. Pain Assessment: Severity of Chronic pain is reported as a 7 /10. Location: Leg Left/left hip to calf; different areas of the leg at different times. Onset: More than a month ago. Quality: Cramping, Sore. Timing: Constant. Modifying factor(s): procedure, PT, topicals. Vitals:  height is 6' (1.829 m) and weight is 189 lb (85.7 kg). His temporal temperature is 97.2 F (36.2 C) (abnormal). His blood pressure is 148/95 (abnormal) and his pulse is 72. His respiration is 18 and oxygen saturation is 100%.  BMI: Estimated body mass index is 25.63 kg/m as calculated from the following:   Height as of this encounter: 6' (1.829 m).   Weight as of this encounter: 189 lb (85.7 kg).  Reason for encounter: post-procedure evaluation and assessment.   Post-procedure  evaluation   Type: Lumbar epidural steroid injection (LESI) (interlaminar) #1    Laterality: Left   Level:  L4-5 Level.  Imaging: Fluoroscopic guidance         Anesthesia: Local anesthesia (1-2% Lidocaine) Sedation: 5 mg p.o. Valium DOS: 04/05/2022  Performed by: Gillis Santa, MD  Purpose: Diagnostic/Therapeutic Indications: Lumbar radicular pain of intraspinal etiology of more than 4 weeks that has failed to respond to conservative therapy and is severe enough to impact quality of life or function. 1. Spinal stenosis, lumbar region, with neurogenic claudication (L4/5)   2. Radicular pain of left lower extremity    NAS-11 Pain score:   Pre-procedure: 10-Worst pain ever/10   Post-procedure: 8 /10      Effectiveness:  Initial hour after procedure: 100 %  Subsequent 4-6 hours post-procedure: 45 %  Analgesia past initial 6 hours: 50 % (X 1 week; presently 60-75% better)  Ongoing improvement:  Analgesic:  60% Function: Somewhat improved ROM: [name] reports improvement in ROM   Has been working with PT for 5 weeks, has made significant progress   ROS  Constitutional: Denies any fever or chills Gastrointestinal: No reported hemesis, hematochezia, vomiting, or acute GI distress Musculoskeletal:  left leg pain Neurological: No reported episodes of acute onset apraxia, aphasia, dysarthria, agnosia, amnesia, paralysis, loss of coordination, or loss of consciousness  Medication Review  DULoxetine, Omega-3 Fatty Acids, multivitamin, sildenafil, tiZANidine, and valsartan  History Review  Allergy: [name] is allergic to lipitor [atorvastatin]. Drug: [name]  reports no history of drug use. Alcohol:  reports no history of alcohol use. Tobacco:  reports that he has never smoked. He has never used smokeless tobacco.  Social: [name]  reports that he has never smoked. He has never used smokeless tobacco. He reports that he does not drink alcohol and does not use  drugs. Medical:  has a past medical history of Anxiety, Chest pain (2006), Dyslipidemia, GERD (gastroesophageal reflux disease), History of cardiovascular stress test (2006), and Hypertension. Surgical: [name]  has a past surgical history that includes Testicle torsion reduction and Laceration repair. Family: family history includes Cancer in his maternal aunt; Diabetes in his brother and mother; Hypertension in his brother, brother, brother, mother, and sister; Hypothyroidism in his mother; Other in his father; Stroke in his maternal uncle.  Laboratory Chemistry Profile   Renal Lab Results  Component Value Date   BUN 15 04/07/2021   CREATININE 1.18 04/07/2021   LABCREA 188 10/20/2015   BCR 13 04/07/2021   GFRAA 79 01/23/2020   GFRNONAA 68 01/23/2020    Hepatic Lab Results  Component Value Date   AST 25 04/07/2021   ALT 27 04/07/2021   ALBUMIN 4.8 04/07/2021   ALKPHOS 90 04/07/2021   LIPASE 24 07/25/2013    Electrolytes Lab Results  Component Value Date   NA 140 04/07/2021   K 4.8 04/07/2021   CL 102 04/07/2021   CALCIUM 9.9 04/07/2021    Bone Lab Results  Component Value Date   TESTOSTERONE 353 01/12/2017    Inflammation (CRP: Acute Phase) (ESR: Chronic Phase) No results found for: "CRP", "ESRSEDRATE", "LATICACIDVEN"       Note: Above Lab results reviewed.  Recent Imaging Review  DG PAIN CLINIC C-ARM 1-60 MIN NO REPORT Fluoro was used, but no Radiologist interpretation will be provided.  Please refer to "NOTES" tab for provider progress note. Note: Reviewed        Physical Exam  General appearance: Well nourished, well developed, and well hydrated. In no apparent acute distress Mental status: Alert, oriented x 3 (person, place, & time)       Respiratory: No evidence of acute respiratory distress Eyes: PERLA Vitals: BP (!) 148/95   Pulse 72   Temp (!) 97.2 F (36.2 C) (Temporal)   Resp 18   Ht 6' (1.829 m)   Wt 189 lb (85.7 kg)   SpO2 100%   BMI  25.63 kg/m  BMI: Estimated body mass index is 25.63 kg/m as calculated from the following:   Height as of this encounter: 6' (1.829 m).   Weight as of this encounter: 189 lb (85.7 kg). Ideal: Ideal body weight: 77.6 kg (171 lb 1.2 oz) Adjusted ideal body weight: 80.9 kg (178 lb 3.9 oz)  Patient presents today in wheelchair however states that his left leg pain has significantly improved Able to ambulate with less pain Increased range of motion of lumbar spine  Assessment   Diagnosis Status  1. Spinal stenosis, lumbar region, with neurogenic claudication (L4/5)   2. Radicular pain of left lower extremity   3. Chronic pain syndrome    Controlled Controlled Controlled   Updated Problems: Problem  Chronic Pain Syndrome    Plan of Care    [name] has a current medication list which includes the following long-term medication(s): duloxetine, sildenafil, and valsartan.  Pharmacotherapy (Medications Ordered): Meds ordered this encounter  Medications   DULoxetine (CYMBALTA) 20 MG capsule    Sig: Take 1 capsule (20 mg total) by mouth daily for 30 days, THEN 2 capsules (40 mg total) daily.    Dispense:  150 capsule    Refill:  0  Orders:  Orders Placed This Encounter  Procedures   Lumbar Epidural Injection    Standing Status:   Standing    Number of Occurrences:   1    Standing Expiration Date:   11/01/2022    Scheduling Instructions:     Procedure: Interlaminar Lumbar Epidural Steroid injection (LESI)            Laterality: L4/5     Timeframe: PRN    Order Specific Question:   Where will this procedure be performed?    Answer:   [facility]   Follow-up plan:   Return for PRN- repeat L-ESI, pt with call to schedule.     Left L4/5 ESI 04/05/22    Recent Visits Date Type Provider Dept  04/05/22 Procedure visit Gillis Santa, MD [facility]  03/08/22 Office Visit Gillis Santa, MD [facility]  Showing recent visits within  past 90 days and meeting all other requirements Today's Visits Date Type Provider Dept  05/03/22 Office Visit Gillis Santa, MD [facility]  Showing today's visits and meeting all other requirements Future Appointments No visits were found meeting these conditions. Showing future appointments within next 90 days and meeting all other requirements  I discussed the assessment and treatment plan with the patient. The patient was provided an opportunity to ask questions and all were answered. The patient agreed with the plan and demonstrated an understanding of the instructions.  Patient advised to call back or seek an in-person evaluation if the symptoms or condition worsens.  Duration of encounter: 61mnutes.  Total time on encounter, as per AMA guidelines included both the face-to-face and non-face-to-face time personally spent by the physician and/or other qualified health care professional(s) on the day of the encounter (includes time in activities that require the physician or other qualified health care professional and does not include time in activities normally performed by clinical staff). Physician's time may include the following activities when performed: Preparing to see the patient (e.g., pre-charting review of records, searching for previously ordered imaging, lab work, and nerve conduction tests) Review of prior analgesic pharmacotherapies. Reviewing PMP Interpreting ordered tests (e.g., lab work, imaging, nerve conduction tests) Performing post-procedure evaluations, including interpretation of diagnostic procedures Obtaining and/or reviewing separately obtained history Performing a medically appropriate examination and/or evaluation Counseling and educating the patient/family/caregiver Ordering medications, tests, or procedures Referring and communicating with other health care professionals (when not separately reported) Documenting clinical information in the  electronic or other health record Independently interpreting results (not separately reported) and communicating results to the patient/ family/caregiver Care coordination (not separately reported)  Note by: BGillis Santa MD Date: 05/03/2022; Time: 3:17 PM

## 2022-05-05 ENCOUNTER — Encounter: Payer: Self-pay | Admitting: Medical

## 2022-05-05 ENCOUNTER — Ambulatory Visit: Payer: 59 | Admitting: Medical

## 2022-05-05 VITALS — BP 110/70 | HR 59 | Wt 186.8 lb

## 2022-05-05 DIAGNOSIS — G894 Chronic pain syndrome: Secondary | ICD-10-CM | POA: Diagnosis not present

## 2022-05-05 DIAGNOSIS — R937 Abnormal findings on diagnostic imaging of other parts of musculoskeletal system: Secondary | ICD-10-CM

## 2022-05-05 DIAGNOSIS — M5440 Lumbago with sciatica, unspecified side: Secondary | ICD-10-CM | POA: Diagnosis not present

## 2022-05-05 DIAGNOSIS — M47816 Spondylosis without myelopathy or radiculopathy, lumbar region: Secondary | ICD-10-CM | POA: Diagnosis not present

## 2022-05-05 DIAGNOSIS — M5410 Radiculopathy, site unspecified: Secondary | ICD-10-CM

## 2022-05-05 DIAGNOSIS — G8929 Other chronic pain: Secondary | ICD-10-CM

## 2022-05-05 DIAGNOSIS — M48062 Spinal stenosis, lumbar region with neurogenic claudication: Secondary | ICD-10-CM

## 2022-05-05 NOTE — Progress Notes (Signed)
Subjective:     Patient ID: Omar Omar, male   DOB: 04-28-1965, 57 y.o.   MRN: [id number]  HPI Chief Complaint  Patient presents with   discuss pain management    Discuss pain management to just follow-up on what's been going on   Here for f/u on pain.  Saw pain management yesterday.  Not tolerating the gabapentin so medications were changed yesterday.    Started having problems with back pain, leg pain, weakness since 12/2021.  Since initial visit has seen orthopedics, neurosurgery, and subsequently massage therapy, chiropractor therapy, PT, acupuncture.  He is still seeing multiple therapy modalities weekly.  Getting ready to start dry needling.  He is making progress.  Insurance not covering some of his treatments.  Paying out of pocket for several therapies . Been seeing massage therapy, physical therapy, chiropractor therapy, acupuncture, pain management currently.  Having lot of out of pocket expense weekly.    Feels like he is making progress.  He has had varying opinions on the culprit, one doctor saying stenosis, one doctor saying bulging disc, one doctor recommending surgery.     Feels ongoing pain, weakness, issues with coordination.   Still needs time to improve as he wants to avoid surgery if possible.  Gets pain particularly in left thigh and left leg.    He has done PT about 5 weeks.   Gets decompression with chiropractor, no adjustments.    Having some pulling sensation in thighs.  Gets some intermittent tingling in legs.  Still having low back pain.  Mentally doing better if feeling better with pain.  Pain varies from day to day.     Seeing PT at [facility] [address] in [place].  Seeing a massage therapist in [place], [place] on recommendation by a friend, getting deep tissue massage.  Seeing [facility] on [address].    Still using cane some, particularly when getting tired.   Leg raise hurts low back.  Hurts to bend over which is limited right now.  05/10/22 is the  end date of his current work note.   Will need new paperwork from 05/11/22 - 05/28/22.   He plans to go back to work on 05/29/22 with restrictions.  No other aggravating or relieving factors. No other complaint.   Past Medical History:  Diagnosis Date   Anxiety    Chest pain 2006   hospital eval   Dyslipidemia    GERD (gastroesophageal reflux disease)    History of cardiovascular stress test 2006   [facility]   Hypertension    Current Outpatient Medications on File Prior to Visit  Medication Sig Dispense Refill   DULoxetine (CYMBALTA) 20 MG capsule Take 1 capsule (20 mg total) by mouth daily for 30 days, THEN 2 capsules (40 mg total) daily. 150 capsule 0   Multiple Vitamin (MULTIVITAMIN) capsule Take 1 capsule by mouth daily.     Omega-3 Fatty Acids (FISH OIL PO) Take by mouth.     sildenafil (VIAGRA) 50 MG tablet TAKE 1 TABLET BY MOUTH AS NEEDED FOR ERECTILE DYSFUNCTION 10 tablet 0   tiZANidine (ZANAFLEX) 4 MG tablet Take 1 tablet (4 mg total) by mouth every 12 (twelve) hours as needed for muscle spasms. 60 tablet 1   valsartan (DIOVAN) 80 MG tablet Take 1 tablet by mouth once daily 90 tablet 0   No current facility-administered medications on file prior to visit.    Review of Systems As in subjective      Objective:  Physical Exam BP 110/70   Pulse (!) 59   Wt 186 lb 12.8 oz (84.7 kg)   BMI 25.33 kg/m   Wt Readings from Last 3 Encounters:  05/05/22 186 lb 12.8 oz (84.7 kg)  05/03/22 189 lb (85.7 kg)  04/26/22 190 lb (86.2 kg)   Gen: wd, wn ,nad He has a little bit of an antalgic gait with walking, nontender of the legs with seems to be in pain in the leg if trying to use flexion of the back.  Range of motion is quite limited.  Back nontender.  No obvious atrophy of muscles of legs, range of motion of legs relatively normal Pulses normal Left leg DTR little decreased Leg strength a little blunted of the left leg but still able to push against resistance and pull  against resistance Using cane with walking     Assessment:     Encounter Diagnoses  Name Primary?   Chronic bilateral low back pain with sciatica, sciatica laterality unspecified Yes   Lumbar facet arthropathy    Abnormal MRI, lumbar spine    Chronic pain syndrome    Radicular pain of left lower extremity    Spinal stenosis, lumbar region, with neurogenic claudication        Plan:      I reviewed pain management visit from yesterday 05/03/22.  They note he has had some improvement.  At this point has had 1 EDSI with pain management, had prior 2 steroid shots with orthopedic office in September and October.   Gabapentin was discontinued and was started on Cymbalta.   He hasn't picked this up from pharmacy yet,  is starting this today.  He was advised to f/u for EDSI as needed, but no scheduled follow up with pain management currently.    Discussed his progress. He feels 60+% improvement so far.  He is continuing PT, massage, and chiropractor therapy.  Consider repeat EDSI.    Discussed red flag symptom that would prompt immediate recheck or possible surgery.  At his last neurosurgery appt, it was left open in turns of return visit.  The goal was to see how he progresses with non surgical therapy.  Updated work note today.  The goal is back to work restrictions on 05/29/2022  Malikah was seen today for discuss pain management.  Diagnoses and all orders for this visit:  Chronic bilateral low back pain with sciatica, sciatica laterality unspecified  Lumbar facet arthropathy  Abnormal MRI, lumbar spine  Chronic pain syndrome  Radicular pain of left lower extremity  Spinal stenosis, lumbar region, with neurogenic claudication   F/u with pain management and his other therapies

## 2022-05-09 ENCOUNTER — Telehealth: Payer: 59 | Admitting: Medical

## 2022-05-09 DIAGNOSIS — T50905A Adverse effect of unspecified drugs, medicaments and biological substances, initial encounter: Secondary | ICD-10-CM

## 2022-05-09 DIAGNOSIS — G4700 Insomnia, unspecified: Secondary | ICD-10-CM | POA: Diagnosis not present

## 2022-05-09 DIAGNOSIS — G479 Sleep disorder, unspecified: Secondary | ICD-10-CM

## 2022-05-09 MED ORDER — HYDROXYZINE HCL 10 MG PO TABS: ORAL_TABLET | ORAL | 0 refills | Status: DC

## 2022-05-09 NOTE — Progress Notes (Unsigned)
Subjective:     Patient ID: Omar Omar, male   DOB: [birth year], 57 y.o.   MRN: [id number]  This visit type was conducted due to national recommendations for restrictions regarding the COVID-19 Pandemic (e.g. social distancing) in an effort to limit this patient's exposure and mitigate transmission in our community.  Due to their co-morbid illnesses, this patient is at least at moderate risk for complications without adequate follow up.  This format is felt to be most appropriate for this patient at this time.    Documentation for virtual audio and video telecommunications through [place] encounter:  The patient was located at home. The provider was located in the office. The patient did consent to this visit and is aware of possible charges through their insurance for this visit.  The other persons participating in this telemedicine service were none. Time spent on call was 20 minutes and in review of previous records 20 minutes total.  This virtual service is not related to other E/M service within previous 7 days.   HPI Chief Complaint  Patient presents with   Anxiety    Stopped taking muscle relaxer and gabapentin and having some anixety    Virtual consult for sleep concerns, medication intolerance  I just saw him within the last week for follow-up on chronic pain, medication review, review of progress.  At his recent visit for those issues it was my understanding that after he had talk to his pain management doctor and his pharmacist, he had already stopped gabapentin and muscle relaxer tizanidine.  However today he states that he had been weaning down off of gabapentin and tizanidine over recent weeks and then ultimately stopped it since our last visit after talking with his pharmacist.  He did wean down off the medication gradually per pharmacy recommendations  His current problem is that he is not sleeping.  The gabapentin and tizanidine did not seem to help with sleep.   He thinks that tizanidine helped a bit better for sleep but he does not want to take those 2 medications now.  He wants something different to help with sleep but does not only thing addictive  He was on 600 mg of gabapentin 3 times a day and then weaned down to 300 mg 3 times a day reportedly over the last few weeks.  He had recently started Cymbalta as an alternative to help with pain and mood and anxiety but did not tolerate that either after a brief trial so he is no longer on that.  The Cymbalta was prescribed by pain management on January 24  On the 1 hand he states that he feels like he has not been sleeping well the last few weeks, but on the other hand tizanidine and gabapentin was helping to some extent  Past Medical History:  Diagnosis Date   Anxiety    Chest pain 2006   hospital eval   Dyslipidemia    GERD (gastroesophageal reflux disease)    History of cardiovascular stress test 2006   [facility]   Hypertension    Current Outpatient Medications on File Prior to Visit  Medication Sig Dispense Refill   Multiple Vitamin (MULTIVITAMIN) capsule Take 1 capsule by mouth daily.     Omega-3 Fatty Acids (FISH OIL PO) Take by mouth.     sildenafil (VIAGRA) 50 MG tablet TAKE 1 TABLET BY MOUTH AS NEEDED FOR ERECTILE DYSFUNCTION 10 tablet 0   valsartan (DIOVAN) 80 MG tablet Take 1 tablet by mouth once  daily 90 tablet 0   No current facility-administered medications on file prior to visit.    Review of Systems As in subjective    Objective:   Physical Exam Due to coronavirus pandemic stay at home measures, patient visit was virtual and they were not examined in person.   There were no vitals taken for this visit.  Gen: wd, wn, nad Pleasant, good eye contact, answers questions appropriately     Assessment:     Encounter Diagnoses  Name Primary?   Insomnia, unspecified type Yes   Sleep disturbance    Medication reaction, initial encounter        Plan:     He  continues with his treatments for severe back pain and weakness in the legs and paresthesias.  Treatments including physical therapy, chiropractic therapy, massage therapy, management and medication through pain management clinic.  He has also seen both orthopedics and neurosurgery in recent months for the same issues  His main concern today is sleep since he has weaned off gabapentin and tizanidine.  He had a recent retrial of Cymbalta to help with pain and anxiety but apparently did not tolerate that and has discontinued this as well  He wants to avoid habit-forming medications.  He also seems to be real sensitive to medications.  So we will try hydroxyzine.  Of note he recently tried melatonin on his own and did not feel like he was getting benefit of that either  Advised 20 mg at nighttime hydroxyzine.  If anxious feeling can use this 10 mg during the day as well once or twice in the daytime.  Addendum: I am finishing the chart documentation today on May 10, 2022 the day after his visit.  I had to leave the office around 2 PM yesterday to go complete CPR training.  Apparently he went to the emergency department last night after our visit earlier in the day.  I reviewed the emergency department notes where he showed up at about 2:20 AM in the morning.  He was restarted on Lexapro and Lunesta by emergency department last night.  He apparently has taken these 2 medications in the past and done okay with these.  Apparently he had called the crisis hotline yesterday from emergency department but was not suicidal or homicidal.  We will reach out to him today  Willie was seen today for anxiety.  Diagnoses and all orders for this visit:  Insomnia, unspecified type  Sleep disturbance  Medication reaction, initial encounter  Other orders -     hydrOXYzine (ATARAX) 10 MG tablet; 2 tablets QHS for sleep, can use 1 tablet in the day time for anxiety  F/u prn

## 2022-05-10 ENCOUNTER — Emergency Department (HOSPITAL_BASED_OUTPATIENT_CLINIC_OR_DEPARTMENT_OTHER)
Admission: EM | Admit: 2022-05-10 | Discharge: 2022-05-10 | Disposition: A | Discharge status: Home / Self Care | Payer: 59 | Attending: Emergency Medicine | Admitting: Emergency Medicine

## 2022-05-10 ENCOUNTER — Other Ambulatory Visit: Payer: Self-pay

## 2022-05-10 DIAGNOSIS — Z79899 Other long term (current) drug therapy: Secondary | ICD-10-CM | POA: Diagnosis not present

## 2022-05-10 DIAGNOSIS — F419 Anxiety disorder, unspecified: Secondary | ICD-10-CM | POA: Insufficient documentation

## 2022-05-10 DIAGNOSIS — G4700 Insomnia, unspecified: Secondary | ICD-10-CM | POA: Insufficient documentation

## 2022-05-10 DIAGNOSIS — R000 Tachycardia, unspecified: Secondary | ICD-10-CM | POA: Insufficient documentation

## 2022-05-10 DIAGNOSIS — R002 Palpitations: Secondary | ICD-10-CM | POA: Diagnosis not present

## 2022-05-10 MED ORDER — ESZOPICLONE 1 MG PO TABS: 1.0000 mg | ORAL_TABLET | Freq: Every evening | ORAL | 0 refills | Status: DC | PRN

## 2022-05-10 MED ORDER — ESCITALOPRAM OXALATE 10 MG PO TABS: 10.0000 mg | ORAL_TABLET | Freq: Every day | ORAL | 0 refills | Status: DC

## 2022-05-10 MED ORDER — LORAZEPAM 1 MG PO TABS
2.0000 mg | ORAL_TABLET | Freq: Once | ORAL | Status: AC
  Administered 2022-05-10: 2 mg via ORAL
  Filled 2022-05-10: qty 2

## 2022-05-10 NOTE — ED Provider Notes (Signed)
[facility] Provider Note   CSN: [id number] Arrival date & time: 05/10/22  0036     History  Chief Complaint  Patient presents with   Tachycardia    Omar Phillips is a 57 y.o. male.  57 year old male who presents ER today secondary to difficulty sleeping and subsequent palpitations.  Patient relays that back in the fall he had severe low back and leg pain that made him incapacitated.  He had injections, x-rays, MRIs and ultimately ended up in pain management restarted on Neurontin.  He started get other alternative treatments which seem to help any reduced his Neurontin from 1800 daily to 0 over 7 days with the pharmacist recommendation.  States the last few days of doing that he had difficulty sleeping.  States he stays awake till 2, 3, 4:00 in the morning.  He states while he is trying to sleep he feels his heart is pounding out of his chest and that his blood pressure was high.  States he is thinking about not being able to sleep and the stresses throughout the day.  States he does not take naps in the day.  No caffeine.  No alcohol, drugs or tobacco.  Exercises much as his pain will let him.  Saw his doctor who suggested hydroxyzine 3 try that tonight felt little bit sedated but did help with his sleep so he presents here for further evaluation after talking some in the crisis line.  He denies suicidal or homicidal thoughts but does endorse anxiety but thinks is related to not sleeping rather than primary anxiety.  States has been on [place].  In the past for similar symptoms and they seem to help quite a bit.  He is not interested in any addictive medications for this anxiety and once again thinks that the sleep is causing the first place.        Home Medications Prior to Admission medications   Medication Sig Start Date End Date Taking? Authorizing Provider  escitalopram (LEXAPRO) 10 MG tablet Take 1 tablet (10 mg total)  by mouth daily. 05/10/22  Yes Stuart Guillen, Corene Cornea, MD  eszopiclone (LUNESTA) 1 MG TABS tablet Take 1 tablet (1 mg total) by mouth at bedtime as needed for sleep. Take immediately before bedtime 05/10/22  Yes Gizzelle Lacomb, Corene Cornea, MD  hydrOXYzine (ATARAX) 10 MG tablet 2 tablets QHS for sleep, can use 1 tablet in the day time for anxiety 05/09/22   Tysinger, Camelia Eng, PA-C  Multiple Vitamin (MULTIVITAMIN) capsule Take 1 capsule by mouth daily.    [provider]  Omega-3 Fatty Acids (FISH OIL PO) Take by mouth.    [provider]  sildenafil (VIAGRA) 50 MG tablet TAKE 1 TABLET BY MOUTH AS NEEDED FOR ERECTILE DYSFUNCTION 11/22/21   Tysinger, Camelia Eng, PA-C  valsartan (DIOVAN) 80 MG tablet Take 1 tablet by mouth once daily 03/10/22   Tysinger, Camelia Eng, PA-C      Allergies    Lipitor [atorvastatin]    Review of Systems   Review of Systems  Physical Exam Updated Vital Signs BP (!) 151/96   Pulse 63   Temp 98.1 F (36.7 C) (Oral)   Resp 18   SpO2 100%  Physical Exam Vitals and nursing note reviewed.  Constitutional:      Appearance: He is well-developed.  HENT:     Head: Normocephalic and atraumatic.  Eyes:     Pupils: Pupils are equal, round, and reactive to light.  Cardiovascular:     Rate and Rhythm: Normal rate.     Comments: He is slightly hypertensive but no tachycardia Pulmonary:     Effort: Pulmonary effort is normal. No respiratory distress.  Abdominal:     General: Abdomen is flat. There is no distension.  Musculoskeletal:        General: Normal range of motion.     Cervical back: Normal range of motion.  Skin:    General: Skin is warm and dry.  Neurological:     General: No focal deficit present.     Mental Status: He is alert.     ED Results / Procedures / Treatments   Labs (all labs ordered are listed, but only abnormal results are displayed) Labs Reviewed - No data to display  EKG None  Radiology No results found.  Procedures Procedures     Medications Ordered in ED Medications  LORazepam (ATIVAN) tablet 2 mg (2 mg Oral Given 05/10/22 0208)    ED Course/ Medical Decision Making/ A&P                             Medical Decision Making Risk Prescription drug management.   Spent considerable amount of time talking to the patient about anxiety, sleep hygiene, palpitations, pain control and different psychiatric/psychological and medical treatments for the same.  Ultimately we decided that he should just try what worked in the past which was Lexapro and Lunesta and follow-up with his PCP.  He is also working to get a psychiatrist to help with things as needed.  No indication for labs or further workup at this time.  Final Clinical Impression(s) / ED Diagnoses Final diagnoses:  Insomnia, unspecified type  Anxiety    Rx / DC Orders ED Discharge Orders          Ordered    eszopiclone (LUNESTA) 1 MG TABS tablet  At bedtime PRN        05/10/22 0204    escitalopram (LEXAPRO) 10 MG tablet  Daily        05/10/22 0204              Demaris Leavell, Corene Cornea, MD 05/10/22 [id number]

## 2022-05-10 NOTE — ED Notes (Signed)
ED Provider at bedside. 

## 2022-05-10 NOTE — ED Notes (Signed)
Pt and spouse, at bedside, agreeable with d/c plan as discussed by provider- this nurse has verbally reinforced d/c instructiosn and provided pt with written copy.  Pt acknowledges verbal understanding and denies any addl questions concerns needs - no acute changes/distress noted at d/c

## 2022-05-10 NOTE — ED Triage Notes (Signed)
Pt states he has been on as much as 1800 mg of gabapentin recently for sciatic nerve pain.  Pt reports since trying to wean off of this has  not slept and feels he is nervous and heart is racing.  Also was taking tizanidine.  Was prescribed a new medication today to help him sleep.  Has taken this but has continued to have "heart pumping" and "shakes"

## 2022-05-11 ENCOUNTER — Telehealth: Payer: Self-pay | Admitting: Medical

## 2022-05-11 NOTE — Telephone Encounter (Signed)
Transition Care Management Follow-up Telephone Call Date of discharge and from where: 05/10/2022 ER [facility] How have you been since you were released from the hospital? Much better medication given he was able to sleep and pt is ready to return to work Any questions or concerns? No  Items Reviewed: Did the pt receive and understand the discharge instructions provided? Yes  Medications obtained and verified? Yes pt was given Lunesta and Lexapro. He was picked both of those up Other? No  Any new allergies since your discharge? No  Dietary orders reviewed? No Do you have support at home? Yes pt lives with his spouse  [place] and Equipment/Supplies: Were home health services ordered? not applicable   PCP Hospital f/u appt confirmed? Yes  Scheduled to see Dorothea Ogle on 05/17/2022 @ 3:45. Are transportation arrangements needed? Yes  If their condition worsens, is the pt aware to call PCP or go to the Emergency Dept.? Yes Was the patient provided with contact information for the PCP's office or ED? Yes pt was provided with my name phone hours and reminded of phone number here at [facility] Was to pt encouraged to call back with questions or concerns? Yes

## 2022-05-11 NOTE — Progress Notes (Signed)
Pt has follow-up on 2/7

## 2022-05-17 ENCOUNTER — Ambulatory Visit: Payer: 59 | Admitting: Medical

## 2022-05-17 VITALS — BP 110/62 | HR 62

## 2022-05-17 DIAGNOSIS — G8929 Other chronic pain: Secondary | ICD-10-CM

## 2022-05-17 DIAGNOSIS — F419 Anxiety disorder, unspecified: Secondary | ICD-10-CM | POA: Diagnosis not present

## 2022-05-17 DIAGNOSIS — G4700 Insomnia, unspecified: Secondary | ICD-10-CM | POA: Diagnosis not present

## 2022-05-17 DIAGNOSIS — M5440 Lumbago with sciatica, unspecified side: Secondary | ICD-10-CM | POA: Diagnosis not present

## 2022-05-17 DIAGNOSIS — M47816 Spondylosis without myelopathy or radiculopathy, lumbar region: Secondary | ICD-10-CM

## 2022-05-17 DIAGNOSIS — R937 Abnormal findings on diagnostic imaging of other parts of musculoskeletal system: Secondary | ICD-10-CM

## 2022-05-17 DIAGNOSIS — M48062 Spinal stenosis, lumbar region with neurogenic claudication: Secondary | ICD-10-CM

## 2022-05-17 DIAGNOSIS — M5410 Radiculopathy, site unspecified: Secondary | ICD-10-CM

## 2022-05-17 MED ORDER — ESZOPICLONE 2 MG PO TABS: 2.0000 mg | ORAL_TABLET | Freq: Every evening | ORAL | 0 refills | Status: DC | PRN

## 2022-05-17 NOTE — Progress Notes (Signed)
Subjective:  Omar Phillips is a 57 y.o. male who presents for Chief Complaint  Patient presents with   Hospitalization Follow-up    Hospital follow-up on sleep and anxiety . Needs refills on lexapro and lunesta     Here for follow up from recent hospital ED visit for difficult time with insomnia and anxiety.  Was on Cymbalta recently but that wasn't working that well for him.  He went to the ED on 05/10/22.  At that visit he was put back on Lexapro which he had used in the past about a year ago.  He is back on 5 mg currently the plan is to go up to 10 mg nightly starting this week.  So far this seems to be helping.  The week before the emergency department visit he had went several days without sleep and is really getting desperate.  [name] was added which he had taken before as well and this helped pretty good but still has little bit difficult time getting to sleep.  He has been so focused on his pain over the last few months that he thinks the anxiety and sleep issues just caught up with him.  He is still seeing his other therapy modes, physical therapy, chiropractor, massage therapy regularly trying to get his body back in shape to improve quality of life where he has had some recent back issues  He would like a referral to counseling.  Currently he still plans to go back to work on 05/29/22 with restrictions.   Given his ongoing back issues he feels as though he can only sit for 1-2 hours at a time, can only walk short distances without having to stop and rest.   Can't lift much over 25lb currently.    No other aggravating or relieving factors.    No other c/o.  Past Medical History:  Diagnosis Date   Anxiety    Chest pain 2006   hospital eval   Dyslipidemia    GERD (gastroesophageal reflux disease)    History of cardiovascular stress test 2006   [facility]   Hypertension    Current Outpatient Medications on File Prior to Visit  Medication Sig Dispense Refill    escitalopram (LEXAPRO) 10 MG tablet Take 1 tablet (10 mg total) by mouth daily. 30 tablet 0   eszopiclone (LUNESTA) 1 MG TABS tablet Take 1 tablet (1 mg total) by mouth at bedtime as needed for sleep. Take immediately before bedtime 30 tablet 0   Multiple Vitamin (MULTIVITAMIN) capsule Take 1 capsule by mouth daily.     Omega-3 Fatty Acids (FISH OIL PO) Take by mouth.     sildenafil (VIAGRA) 50 MG tablet TAKE 1 TABLET BY MOUTH AS NEEDED FOR ERECTILE DYSFUNCTION 10 tablet 0   valsartan (DIOVAN) 80 MG tablet Take 1 tablet by mouth once daily 90 tablet 0   No current facility-administered medications on file prior to visit.    The following portions of the patient's history were reviewed and updated as appropriate: allergies, current medications, past family history, past medical history, past social history, past surgical history and problem list.  ROS Otherwise as in subjective above    Objective: BP 110/62   Pulse 62   General appearance: alert, no distress, well developed, well nourished Psych:pleasant, answers questions appropriately Walking with cane   Assessment: Encounter Diagnoses  Name Primary?   Anxiety Yes   Insomnia, unspecified type    Chronic bilateral low back pain with sciatica, sciatica laterality  unspecified    Lumbar facet arthropathy    Abnormal MRI, lumbar spine    Spinal stenosis, lumbar region, with neurogenic claudication    Radicular pain of left lower extremity      Plan: Anxiety - doing ok on Lexapro which he has taken prior.  Didn't seems to response to Cymbalta.   Refer to therapy  Insomnia - increase to Lunesta '2mg'$  for better efficacy.  Discussed risks/benefits and proper use of medication  Chronic back pain, stenosis, radicular pain - continue current therapies, and plan to return to work with restriction 05/29/22  Omar Phillips was seen today for hospitalization follow-up.  Diagnoses and all orders for this visit:  Anxiety -     Ambulatory  referral to Psychiatry  Insomnia, unspecified type -     Ambulatory referral to Psychiatry  Chronic bilateral low back pain with sciatica, sciatica laterality unspecified  Lumbar facet arthropathy  Abnormal MRI, lumbar spine  Spinal stenosis, lumbar region, with neurogenic claudication  Radicular pain of left lower extremity  Other orders -     eszopiclone (LUNESTA) 2 MG TABS tablet; Take 1 tablet (2 mg total) by mouth at bedtime as needed for sleep. Take immediately before bedtime    Follow up: 54mo

## 2022-05-24 ENCOUNTER — Telehealth: Payer: Self-pay

## 2022-05-24 ENCOUNTER — Telehealth: Payer: Self-pay | Admitting: Medical

## 2022-05-24 NOTE — Telephone Encounter (Signed)
Pt. Called stating his wife dropped off his paperwork for him to return to work and he wanted to let you know if you had any questions you can call him about his restrictions. He said he does need it done by Monday because he is supposed to return to work then.

## 2022-05-24 NOTE — Telephone Encounter (Signed)
Wife droppped off forms to complete for pt's job for him to return to work on Monday, put in [employer] folder

## 2022-05-24 NOTE — Telephone Encounter (Signed)
Pt. Aware to pick up paperwork because it has been completed.

## 2022-06-05 ENCOUNTER — Telehealth: Payer: 59 | Admitting: Medical

## 2022-06-05 ENCOUNTER — Encounter: Payer: Self-pay | Admitting: Medical

## 2022-06-05 VITALS — Ht 72.0 in | Wt 185.0 lb

## 2022-06-05 DIAGNOSIS — F419 Anxiety disorder, unspecified: Secondary | ICD-10-CM

## 2022-06-05 DIAGNOSIS — M47816 Spondylosis without myelopathy or radiculopathy, lumbar region: Secondary | ICD-10-CM

## 2022-06-05 DIAGNOSIS — R937 Abnormal findings on diagnostic imaging of other parts of musculoskeletal system: Secondary | ICD-10-CM

## 2022-06-05 DIAGNOSIS — I10 Essential (primary) hypertension: Secondary | ICD-10-CM

## 2022-06-05 DIAGNOSIS — G894 Chronic pain syndrome: Secondary | ICD-10-CM

## 2022-06-05 DIAGNOSIS — M5440 Lumbago with sciatica, unspecified side: Secondary | ICD-10-CM | POA: Diagnosis not present

## 2022-06-05 DIAGNOSIS — M48062 Spinal stenosis, lumbar region with neurogenic claudication: Secondary | ICD-10-CM

## 2022-06-05 DIAGNOSIS — M5410 Radiculopathy, site unspecified: Secondary | ICD-10-CM

## 2022-06-05 DIAGNOSIS — G8929 Other chronic pain: Secondary | ICD-10-CM

## 2022-06-05 NOTE — Progress Notes (Signed)
This visit type was conducted due to national recommendations for restrictions regarding the COVID-19 Pandemic (e.g. social distancing) in an effort to limit this patient's exposure and mitigate transmission in our community.  Due to their co-morbid illnesses, this patient is at least at moderate risk for complications without adequate follow up.  This format is felt to be most appropriate for this patient at this time.    Documentation for virtual audio and video telecommunications through [place] encounter:  The patient was located at home. The provider was located in the office. The patient did consent to this visit and is aware of possible charges through their insurance for this visit.  The other persons participating in this telemedicine service were none. Time spent on call was 15 minutes and in review of previous records >20 minutes total.  This virtual service is not related to other E/M service within previous 7 days.  Subjective:  Arhum Kanard Fors is a 57 y.o. male who presents for Chief Complaint  Patient presents with   Follow-up    VIRTUAL he went back to work last Monday and he wanted to keep you updated. 37hrs or less per week. Adjustment was rough, fatigued. Legs are still weak, some pain-but he is ok.       Virtual for follow up on progress in regards to anxiety, insomnia, chronic back pain, radicular pain, leg weakness.   Lately pain can be 4/10 up to 7/10 pain.     Still seeding deep tissue massage therapy, physical therapy at [facility] in [employer], chiropractor Dr. Dorise Bullion, on [place] back to work last week, worked about 35 hours.   First week back was rough, legs felt weeks, pain.    Doing ok so far on Lexapro, been on it about 3 weeks.  Mood is ok.   Insomnia - taking Lunesta, and it is helping with sleep.   Still awakening some in night.    On every 2 weeks every for chiropractor therapy, maintenance therapy.  Seeing PT 2  times per week.  Still using the  05/29/22 work restrictions.   Given his ongoing back issues he feels as though he can only sit for 1-2 hours at a time, can only walk short distances without having to stop and rest.   Can't lift much over 25lb currently.    Still using cane 60% of the time.  No other aggravating or relieving factors.    No other c/o.  Past Medical History:  Diagnosis Date   Anxiety    Chest pain 2006   hospital eval   Dyslipidemia    GERD (gastroesophageal reflux disease)    History of cardiovascular stress test 2006   [facility]   Hypertension    Current Outpatient Medications on File Prior to Visit  Medication Sig Dispense Refill   escitalopram (LEXAPRO) 10 MG tablet Take 1 tablet (10 mg total) by mouth daily. 30 tablet 0   eszopiclone (LUNESTA) 2 MG TABS tablet Take 1 tablet (2 mg total) by mouth at bedtime as needed for sleep. Take immediately before bedtime 20 tablet 0   Multiple Vitamin (MULTIVITAMIN) capsule Take 1 capsule by mouth daily.     Omega-3 Fatty Acids (FISH OIL PO) Take by mouth.     valsartan (DIOVAN) 80 MG tablet Take 1 tablet by mouth once daily 90 tablet 0   sildenafil (VIAGRA) 50 MG tablet TAKE 1 TABLET BY MOUTH AS NEEDED FOR ERECTILE DYSFUNCTION (Patient not  taking: Reported on 06/05/2022) 10 tablet 0   No current facility-administered medications on file prior to visit.    The following portions of the patient's history were reviewed and updated as appropriate: allergies, current medications, past family history, past medical history, past social history, past surgical history and problem list.  ROS Otherwise as in subjective above    Objective: Ht 6' (1.829 m)   Wt 185 lb (83.9 kg)   BMI 25.09 kg/m   General appearance: alert, no distress, well developed, well nourished Psych:pleasant, answers questions appropriately   Assessment: Encounter Diagnoses  Name Primary?   Chronic bilateral low back pain with sciatica,  sciatica laterality unspecified Yes   Lumbar facet arthropathy    Anxiety    Spinal stenosis, lumbar region, with neurogenic claudication    Radicular pain of left lower extremity    Abnormal MRI, lumbar spine    Essential hypertension     Plan: Anxiety - doing ok on Lexapro which he has taken prior.  Didn't seem to respond to Cymbalta.     Insomnia - continue Lunesta '2mg'$  for better efficacy.  Discussed risks/benefits and proper use of medication  Chronic back pain, stenosis, radicular pain - continue current therapies  Hypertension - continue valsartan '80mg'$  daily.  [place] was seen today for follow-up.  Diagnoses and all orders for this visit:  Chronic bilateral low back pain with sciatica, sciatica laterality unspecified  Lumbar facet arthropathy  Anxiety  Spinal stenosis, lumbar region, with neurogenic claudication  Radicular pain of left lower extremity  Abnormal MRI, lumbar spine  Essential hypertension     Follow up: 2 months

## 2022-06-05 NOTE — Progress Notes (Signed)
Sent message to [place], she is done for the day. Will follow up with her tomorrow.

## 2022-06-05 NOTE — Progress Notes (Signed)
Dr. Jimmye Norman will send records over and PT is in media already

## 2022-06-07 NOTE — Progress Notes (Signed)
Omar Phillips sent referral to [facility], they are to contact him and set up appt.

## 2022-06-08 ENCOUNTER — Telehealth: Payer: Self-pay | Admitting: Medical

## 2022-06-08 NOTE — Telephone Encounter (Signed)
Pt called and asks if Audelia Acton can give him a call as soon as possible. He says his job is asking for more elaborate details on what he can and cannot do at work. He says you can call him at [phone] or wife Aniceto Boss [phone].

## 2022-06-09 ENCOUNTER — Telehealth: Payer: Self-pay | Admitting: Medical

## 2022-06-09 NOTE — Telephone Encounter (Signed)
Sent message to wife in email with updated form

## 2022-06-09 NOTE — Telephone Encounter (Signed)
Spoke with patient and his wife and she sent me email about changes and I spoke again with her and shane has updated the form and emailed to her to send to HR.

## 2022-06-09 NOTE — Telephone Encounter (Signed)
We received a phone call today about elevating his work restrictions.  I updated the form and we will fax this.  Please make sure he is aware that I put on there is a restriction date through 08/27/2022  Since I am not a specialist, not a pain management specialist or orthopedist or surgeon, if he has not reached full benefit of his therapy and pretty much back to normal by May 19, he will need to see either the orthopedic or pain specialist before that time  It would be awfully hard for me to continue any type of restriction or paperwork be on May 19 which is 3 additional months from now without a specialist signing off on his restrictions

## 2022-06-14 ENCOUNTER — Telehealth: Payer: Self-pay | Admitting: Student in an Organized Health Care Education/Training Program

## 2022-06-14 NOTE — Telephone Encounter (Signed)
Omar Phillips called to give a report. Patient seems to be having some problems moving left lower extremity, foot. Please call Omar Phillips to get details [phone]

## 2022-06-15 ENCOUNTER — Ambulatory Visit: Payer: 59 | Admitting: Medical

## 2022-06-15 NOTE — Telephone Encounter (Signed)
I noticed he is on the schedule this afternoon.  He has not followed up with the orthopedist or back surgeon in a while.  I saw he is coming for a new complaint that may suggest leg weakness or pinched nerve type complaint.  It would make more sense for him to do a follow-up with orthopedics in case they think something has changed in the spine.  He has primarily been doing chiropractic therapy massage therapy and physical therapy but if he has had a worsening of a symptom it may be worth a check in with orthopedics

## 2022-06-15 NOTE — Telephone Encounter (Signed)
Pt is having issues with foot and doesn't think this is anything from what he is dealing with. He wants to come here first

## 2022-06-16 ENCOUNTER — Telehealth: Payer: Self-pay | Admitting: Medical

## 2022-06-16 NOTE — Telephone Encounter (Signed)
Pt called and states that he is seeing ortho Monday. He is requesting refills on Lunesta. Please send to [facility] on [address].

## 2022-06-17 ENCOUNTER — Other Ambulatory Visit: Payer: Self-pay | Admitting: Medical

## 2022-06-17 MED ORDER — ESZOPICLONE 2 MG PO TABS: 2.0000 mg | ORAL_TABLET | Freq: Every evening | ORAL | 1 refills | Status: DC | PRN

## 2022-06-22 ENCOUNTER — Encounter: Payer: Self-pay | Admitting: Emergency Medicine

## 2022-06-22 ENCOUNTER — Inpatient Hospital Stay
Admission: EM | Admit: 2022-06-22 | Discharge: 2022-06-25 | DRG: 439 | Disposition: A | Discharge status: Home / Self Care | Payer: 59 | Attending: Internal Medicine | Admitting: Internal Medicine

## 2022-06-22 DIAGNOSIS — Z833 Family history of diabetes mellitus: Secondary | ICD-10-CM

## 2022-06-22 DIAGNOSIS — K219 Gastro-esophageal reflux disease without esophagitis: Secondary | ICD-10-CM | POA: Diagnosis present

## 2022-06-22 DIAGNOSIS — E7800 Pure hypercholesterolemia, unspecified: Secondary | ICD-10-CM | POA: Diagnosis present

## 2022-06-22 DIAGNOSIS — Z8249 Family history of ischemic heart disease and other diseases of the circulatory system: Secondary | ICD-10-CM

## 2022-06-22 DIAGNOSIS — K810 Acute cholecystitis: Secondary | ICD-10-CM | POA: Insufficient documentation

## 2022-06-22 DIAGNOSIS — B9620 Unspecified Escherichia coli [E. coli] as the cause of diseases classified elsewhere: Secondary | ICD-10-CM | POA: Diagnosis present

## 2022-06-22 DIAGNOSIS — Z888 Allergy status to other drugs, medicaments and biological substances status: Secondary | ICD-10-CM

## 2022-06-22 DIAGNOSIS — Z823 Family history of stroke: Secondary | ICD-10-CM

## 2022-06-22 DIAGNOSIS — K859 Acute pancreatitis without necrosis or infection, unspecified: Secondary | ICD-10-CM | POA: Diagnosis not present

## 2022-06-22 DIAGNOSIS — E785 Hyperlipidemia, unspecified: Secondary | ICD-10-CM | POA: Diagnosis present

## 2022-06-22 DIAGNOSIS — K851 Biliary acute pancreatitis without necrosis or infection: Secondary | ICD-10-CM | POA: Diagnosis not present

## 2022-06-22 DIAGNOSIS — R7881 Bacteremia: Secondary | ICD-10-CM | POA: Diagnosis present

## 2022-06-22 DIAGNOSIS — F419 Anxiety disorder, unspecified: Secondary | ICD-10-CM | POA: Diagnosis present

## 2022-06-22 DIAGNOSIS — K828 Other specified diseases of gallbladder: Secondary | ICD-10-CM | POA: Diagnosis present

## 2022-06-22 DIAGNOSIS — I10 Essential (primary) hypertension: Secondary | ICD-10-CM | POA: Diagnosis present

## 2022-06-22 DIAGNOSIS — Z79899 Other long term (current) drug therapy: Secondary | ICD-10-CM

## 2022-06-22 LAB — URINALYSIS, ROUTINE W REFLEX MICROSCOPIC
Bacteria, UA: NONE SEEN
Bilirubin Urine: NEGATIVE
Glucose, UA: >=500 mg/dL — AB
Hgb urine dipstick: NEGATIVE
Ketones, ur: NEGATIVE mg/dL
Leukocytes,Ua: NEGATIVE
Nitrite: NEGATIVE
Protein, ur: NEGATIVE mg/dL
Specific Gravity, Urine: 1.022 (ref 1.005–1.030)
pH: 6 (ref 5.0–8.0)

## 2022-06-22 LAB — COMPREHENSIVE METABOLIC PANEL
ALT: 54 U/L — ABNORMAL HIGH (ref 0–44)
AST: 97 U/L — ABNORMAL HIGH (ref 15–41)
Albumin: 4.1 g/dL (ref 3.5–5.0)
Alkaline Phosphatase: 61 U/L (ref 38–126)
Anion gap: 8 (ref 5–15)
BUN: 18 mg/dL (ref 6–20)
CO2: 29 mmol/L (ref 22–32)
Calcium: 9 mg/dL (ref 8.9–10.3)
Chloride: 99 mmol/L (ref 98–111)
Creatinine, Ser: 1.07 mg/dL (ref 0.61–1.24)
GFR, Estimated: >60 mL/min (ref >60)
Glucose, Bld: 173 mg/dL — ABNORMAL HIGH (ref 70–99)
Potassium: 4 mmol/L (ref 3.5–5.1)
Sodium: 136 mmol/L (ref 135–145)
Total Bilirubin: 0.6 mg/dL (ref 0.3–1.2)
Total Protein: 7.2 g/dL (ref 6.5–8.1)

## 2022-06-22 LAB — CBC
HCT: 39 % (ref 39.0–52.0)
Hemoglobin: 13 g/dL (ref 13.0–17.0)
MCH: 31.1 pg (ref 26.0–34.0)
MCHC: 33.3 g/dL (ref 30.0–36.0)
MCV: 93.3 fL (ref 80.0–100.0)
Platelets: 338 10*3/uL (ref 150–400)
RBC: 4.18 MIL/uL — ABNORMAL LOW (ref 4.22–5.81)
RDW: 12 % (ref 11.5–15.5)
WBC: 6.6 10*3/uL (ref 4.0–10.5)
nRBC: 0 % (ref 0.0–0.2)

## 2022-06-22 LAB — LIPASE, BLOOD: Lipase: 983 U/L — ABNORMAL HIGH (ref 11–51)

## 2022-06-22 MED ORDER — ONDANSETRON HCL 4 MG/2ML IJ SOLN
4.0000 mg | Freq: Once | INTRAMUSCULAR | Status: AC
  Administered 2022-06-22: 4 mg via INTRAVENOUS
  Filled 2022-06-22: qty 2

## 2022-06-22 MED ORDER — MORPHINE SULFATE (PF) 4 MG/ML IV SOLN
4.0000 mg | Freq: Once | INTRAVENOUS | Status: AC
  Administered 2022-06-22: 4 mg via INTRAVENOUS
  Filled 2022-06-22: qty 1

## 2022-06-22 MED ORDER — SODIUM CHLORIDE 0.9 % IV BOLUS
1000.0000 mL | Freq: Once | INTRAVENOUS | Status: AC
  Administered 2022-06-22: 1000 mL via INTRAVENOUS

## 2022-06-22 NOTE — ED Triage Notes (Signed)
Pt presents ambulatory to triage via POV with complaints of epigastric pain that started 30 mins ago. Rates the pain 10/10 and its sharp in nature. No meds taken PTA. Hx of GERD. A&Ox4 at this time. Denies CP or SOB.

## 2022-06-22 NOTE — ED Provider Notes (Signed)
[facility] Provider Note    Event Date/Time   First MD Initiated Contact with Patient 06/22/22 2209     (approximate)   History   Abdominal Pain   HPI  Omar Phillips is a 57 y.o. male who presents to the emergency department today because of concerns for epigastric pain.  The symptoms started about an hour prior to presentation.  They started acutely.  The pain is severe.  Patient denies similar pain in the past.  Does state that the past few months have been hard on him medically.  He has also had multiple changes of medication during this time.  He denies any alcohol use.  Denies any fevers.  Denies any change in his bowel movements.     Physical Exam   Triage Vital Signs: ED Triage Vitals  Enc Vitals Group     BP 06/22/22 2203 (!) 165/92     Pulse Rate 06/22/22 2203 65     Resp 06/22/22 2203 20     Temp 06/22/22 2203 98.7 F (37.1 C)     Temp Source 06/22/22 2203 Oral     SpO2 06/22/22 2203 98 %     Weight 06/22/22 2158 190 lb (86.2 kg)     Height 06/22/22 2158 6' (1.829 m)     Head Circumference --      Peak Flow --      Pain Score 06/22/22 2159 10     Pain Loc --      Pain Edu? --      Excl. in [place]? --     Most recent vital signs: Vitals:   06/22/22 2203  BP: (!) 165/92  Pulse: 65  Resp: 20  Temp: 98.7 F (37.1 C)  SpO2: 98%   General: Awake, alert, oriented. CV:  Good peripheral perfusion. Regular rate and rhythm. Resp:  Normal effort.  Abd:  No distention. Tender to palpation in the epigastric region.    ED Results / Procedures / Treatments   Labs (all labs ordered are listed, but only abnormal results are displayed) Labs Reviewed  LIPASE, BLOOD - Abnormal; Notable for the following components:      Result Value   Lipase 983 (*)    All other components within normal limits  COMPREHENSIVE METABOLIC PANEL - Abnormal; Notable for the following components:   Glucose, Bld 173 (*)    AST 97 (*)    ALT 54 (*)     All other components within normal limits  CBC - Abnormal; Notable for the following components:   RBC 4.18 (*)    All other components within normal limits  URINALYSIS, ROUTINE W REFLEX MICROSCOPIC - Abnormal; Notable for the following components:   Color, Urine YELLOW (*)    APPearance CLEAR (*)    Glucose, UA >=500 (*)    All other components within normal limits     EKG  I, Nance Pear, attending physician, personally viewed and interpreted this EKG  EKG Time: 2202 Rate: 66 Rhythm: sinus rhythm Axis: normal Intervals: qtc 457 QRS: narrow, q waves v1, v2 ST changes: no st elevation Impression: abnormal ekg   RADIOLOGY CT pending at time of sign out   PROCEDURES:  Critical Care performed: No   MEDICATIONS ORDERED IN ED: Medications - No data to display   IMPRESSION / MDM / [place] / ED COURSE  I reviewed the triage vital signs and the nursing notes.  Differential diagnosis includes, but is not limited to, gastritis, pancreatitis, gallbladder disease, hepatitis.  Patient's presentation is most consistent with acute presentation with potential threat to life or bodily function.  Patient presented to the emergency department today because of concerns for acute onset of epigastric abdominal pain that started roughly 1 hour prior to presentation.  On exam he is somewhat tender in the epigastric region.  He is afebrile.  Blood work was concerning for elevated lipase.  I discussed concern for pancreatitis with the patient.  Will obtain CT scan to further evaluate possible etiology. Awaiting CT scan at time of sign out.     FINAL CLINICAL IMPRESSION(S) / ED DIAGNOSES   Final diagnoses:  Acute pancreatitis, unspecified complication status, unspecified pancreatitis type     Note:  This document was prepared using Dragon voice recognition software and may include unintentional dictation errors.    Nance Pear,  MD 06/23/22 [phone]

## 2022-06-23 ENCOUNTER — Emergency Department: Payer: 59

## 2022-06-23 ENCOUNTER — Other Ambulatory Visit: Payer: Self-pay

## 2022-06-23 ENCOUNTER — Encounter: Payer: Self-pay | Admitting: General Practice

## 2022-06-23 ENCOUNTER — Encounter
Admission: EM | Disposition: A | Discharge status: Home / Self Care | Payer: Self-pay | Source: Home / Self Care | Attending: Internal Medicine

## 2022-06-23 ENCOUNTER — Other Ambulatory Visit: Payer: 59

## 2022-06-23 DIAGNOSIS — E785 Hyperlipidemia, unspecified: Secondary | ICD-10-CM

## 2022-06-23 DIAGNOSIS — K810 Acute cholecystitis: Secondary | ICD-10-CM | POA: Insufficient documentation

## 2022-06-23 DIAGNOSIS — K851 Biliary acute pancreatitis without necrosis or infection: Secondary | ICD-10-CM | POA: Diagnosis present

## 2022-06-23 DIAGNOSIS — Z8249 Family history of ischemic heart disease and other diseases of the circulatory system: Secondary | ICD-10-CM | POA: Diagnosis not present

## 2022-06-23 DIAGNOSIS — K859 Acute pancreatitis without necrosis or infection, unspecified: Secondary | ICD-10-CM | POA: Diagnosis present

## 2022-06-23 DIAGNOSIS — B9620 Unspecified Escherichia coli [E. coli] as the cause of diseases classified elsewhere: Secondary | ICD-10-CM | POA: Diagnosis present

## 2022-06-23 DIAGNOSIS — Z888 Allergy status to other drugs, medicaments and biological substances status: Secondary | ICD-10-CM | POA: Diagnosis not present

## 2022-06-23 DIAGNOSIS — Z823 Family history of stroke: Secondary | ICD-10-CM | POA: Diagnosis not present

## 2022-06-23 DIAGNOSIS — K219 Gastro-esophageal reflux disease without esophagitis: Secondary | ICD-10-CM | POA: Diagnosis present

## 2022-06-23 DIAGNOSIS — R7881 Bacteremia: Secondary | ICD-10-CM | POA: Diagnosis not present

## 2022-06-23 DIAGNOSIS — Z833 Family history of diabetes mellitus: Secondary | ICD-10-CM | POA: Diagnosis not present

## 2022-06-23 DIAGNOSIS — I10 Essential (primary) hypertension: Secondary | ICD-10-CM | POA: Diagnosis present

## 2022-06-23 DIAGNOSIS — F419 Anxiety disorder, unspecified: Secondary | ICD-10-CM | POA: Diagnosis present

## 2022-06-23 DIAGNOSIS — Z79899 Other long term (current) drug therapy: Secondary | ICD-10-CM | POA: Diagnosis not present

## 2022-06-23 DIAGNOSIS — E7800 Pure hypercholesterolemia, unspecified: Secondary | ICD-10-CM | POA: Diagnosis present

## 2022-06-23 DIAGNOSIS — K828 Other specified diseases of gallbladder: Secondary | ICD-10-CM | POA: Diagnosis present

## 2022-06-23 LAB — BLOOD CULTURE ID PANEL (REFLEXED) - BCID2

## 2022-06-23 LAB — CBC
HCT: 44.3 % (ref 39.0–52.0)
HCT: 39 % (ref 39.0–52.0)
Hemoglobin: 14.1 g/dL (ref 13.0–17.0)
Hemoglobin: 13 g/dL (ref 13.0–17.0)
MCH: 31.3 pg (ref 26.0–34.0)
MCH: 30.8 pg (ref 26.0–34.0)
MCHC: 31.8 g/dL (ref 30.0–36.0)
MCHC: 33.3 g/dL (ref 30.0–36.0)
MCV: 98.2 fL (ref 80.0–100.0)
MCV: 92.4 fL (ref 80.0–100.0)
Platelets: 227 K/uL (ref 150–400)
Platelets: 319 10*3/uL (ref 150–400)
RBC: 4.51 MIL/uL (ref 4.22–5.81)
RBC: 4.22 MIL/uL (ref 4.22–5.81)
RDW: 12 % (ref 11.5–15.5)
RDW: 12 % (ref 11.5–15.5)
WBC: 12.2 K/uL — ABNORMAL HIGH (ref 4.0–10.5)
WBC: 12 10*3/uL — ABNORMAL HIGH (ref 4.0–10.5)
nRBC: 0 % (ref 0.0–0.2)
nRBC: 0 % (ref 0.0–0.2)

## 2022-06-23 LAB — COMPREHENSIVE METABOLIC PANEL
ALT: 153 U/L — ABNORMAL HIGH (ref 0–44)
AST: 130 U/L — ABNORMAL HIGH (ref 15–41)
Albumin: 3.9 g/dL (ref 3.5–5.0)
Alkaline Phosphatase: 72 U/L (ref 38–126)
Anion gap: 15 (ref 5–15)
BUN: 13 mg/dL (ref 6–20)
CO2: 24 mmol/L (ref 22–32)
Calcium: 9.3 mg/dL (ref 8.9–10.3)
Chloride: 101 mmol/L (ref 98–111)
Creatinine, Ser: 0.93 mg/dL (ref 0.61–1.24)
GFR, Estimated: >60 mL/min (ref >60)
Glucose, Bld: 144 mg/dL — ABNORMAL HIGH (ref 70–99)
Potassium: 3.6 mmol/L (ref 3.5–5.1)
Sodium: 140 mmol/L (ref 135–145)
Total Bilirubin: 0.8 mg/dL (ref 0.3–1.2)
Total Protein: 7 g/dL (ref 6.5–8.1)

## 2022-06-23 LAB — LIPID PANEL
Cholesterol: 233 mg/dL — ABNORMAL HIGH (ref 0–200)
HDL: 58 mg/dL (ref >40)
LDL Cholesterol: 153 mg/dL — ABNORMAL HIGH (ref 0–99)
Total CHOL/HDL Ratio: 4 RATIO
Triglycerides: 110 mg/dL (ref <150)
VLDL: 22 mg/dL (ref 0–40)

## 2022-06-23 LAB — LIPASE, BLOOD: Lipase: 521 U/L — ABNORMAL HIGH (ref 11–51)

## 2022-06-23 LAB — HIV ANTIBODY (ROUTINE TESTING W REFLEX): HIV Screen 4th Generation wRfx: NONREACTIVE

## 2022-06-23 SURGERY — CHOLECYSTECTOMY, ROBOT-ASSISTED, LAPAROSCOPIC
Anesthesia: Choice

## 2022-06-23 MED ORDER — ENOXAPARIN SODIUM 40 MG/0.4ML IJ SOSY
40.0000 mg | PREFILLED_SYRINGE | INTRAMUSCULAR | Status: DC
  Administered 2022-06-23 – 2022-06-25 (×3): 40 mg via SUBCUTANEOUS
  Filled 2022-06-23 (×3): qty 0.4

## 2022-06-23 MED ORDER — GADOBUTROL 1 MMOL/ML IV SOLN
8.0000 mL | Freq: Once | INTRAVENOUS | Status: AC | PRN
  Administered 2022-06-23: 8 mL via INTRAVENOUS

## 2022-06-23 MED ORDER — IOHEXOL 300 MG/ML  SOLN
100.0000 mL | Freq: Once | INTRAMUSCULAR | Status: AC | PRN
  Administered 2022-06-23: 100 mL via INTRAVENOUS

## 2022-06-23 MED ORDER — LACTATED RINGERS IV SOLN: INTRAVENOUS | Status: DC

## 2022-06-23 MED ORDER — HYDRALAZINE HCL 20 MG/ML IJ SOLN: 10.0000 mg | INTRAMUSCULAR | Status: DC | PRN

## 2022-06-23 MED ORDER — HYDROMORPHONE HCL 1 MG/ML IJ SOLN
1.0000 mg | Freq: Once | INTRAMUSCULAR | Status: AC
  Administered 2022-06-23: 1 mg via INTRAVENOUS
  Filled 2022-06-23: qty 1

## 2022-06-23 MED ORDER — ONDANSETRON HCL 4 MG/2ML IJ SOLN: 4.0000 mg | Freq: Four times a day (QID) | INTRAMUSCULAR | Status: DC | PRN

## 2022-06-23 MED ORDER — HYDROMORPHONE HCL 1 MG/ML IJ SOLN
0.5000 mg | INTRAMUSCULAR | Status: AC | PRN
  Administered 2022-06-23: 0.5 mg via INTRAVENOUS
  Filled 2022-06-23: qty 1

## 2022-06-23 MED ORDER — PIPERACILLIN-TAZOBACTAM 3.375 G IVPB
3.3750 g | Freq: Three times a day (TID) | INTRAVENOUS | Status: DC
  Administered 2022-06-23 – 2022-06-25 (×6): 3.375 g via INTRAVENOUS
  Filled 2022-06-23 (×6): qty 50

## 2022-06-23 MED ORDER — DEXTROSE-NACL 5-0.45 % IV SOLN: INTRAVENOUS | Status: DC

## 2022-06-23 MED ORDER — ONDANSETRON HCL 4 MG PO TABS: 4.0000 mg | ORAL_TABLET | Freq: Four times a day (QID) | ORAL | Status: DC | PRN

## 2022-06-23 MED ORDER — ONDANSETRON HCL 4 MG/2ML IJ SOLN: 4.0000 mg | Freq: Three times a day (TID) | INTRAMUSCULAR | Status: DC | PRN

## 2022-06-23 MED ORDER — MORPHINE SULFATE (PF) 4 MG/ML IV SOLN
4.0000 mg | Freq: Once | INTRAVENOUS | Status: AC
  Administered 2022-06-23: 4 mg via INTRAVENOUS
  Filled 2022-06-23: qty 1

## 2022-06-23 MED ORDER — PIPERACILLIN-TAZOBACTAM 3.375 G IVPB 30 MIN
3.3750 g | Freq: Once | INTRAVENOUS | Status: AC
  Administered 2022-06-23: 3.375 g via INTRAVENOUS
  Filled 2022-06-23: qty 50

## 2022-06-23 MED ORDER — INDOCYANINE GREEN 25 MG IV SOLR
2.5000 mg | INTRAVENOUS | Status: DC
  Filled 2022-06-23: qty 10

## 2022-06-23 SURGICAL SUPPLY — 52 items
BAG PRESSURE INF REUSE 1000 (BAG) IMPLANT
CANNULA CAP OBTURATR AIRSEAL 8 (CAP) IMPLANT
CANNULA REDUC XI 12-8 STAPL (CANNULA) ×1
CANNULA REDUCER 12-8 DVNC XI (CANNULA) ×1 IMPLANT
CLIP LIGATING HEMO O LOK GREEN (MISCELLANEOUS) ×1 IMPLANT
DERMABOND ADVANCED .7 DNX12 (GAUZE/BANDAGES/DRESSINGS) ×1 IMPLANT
DRAPE ARM DVNC X/XI (DISPOSABLE) ×4 IMPLANT
DRAPE COLUMN DVNC XI (DISPOSABLE) ×1 IMPLANT
DRAPE DA VINCI XI ARM (DISPOSABLE) ×4
DRAPE DA VINCI XI COLUMN (DISPOSABLE) ×1
ELECT CAUTERY BLADE TIP 2.5 (TIP) ×1
ELECT REM PT RETURN 9FT ADLT (ELECTROSURGICAL) ×1
ELECTRODE CAUTERY BLDE TIP 2.5 (TIP) ×1 IMPLANT
ELECTRODE REM PT RTRN 9FT ADLT (ELECTROSURGICAL) ×1 IMPLANT
GLOVE SURG SYN 7.0 (GLOVE) ×2 IMPLANT
GLOVE SURG SYN 7.5  E (GLOVE) ×2
GLOVE SURG SYN 7.5 E (GLOVE) ×2 IMPLANT
GOWN STRL REUS W/ TWL LRG LVL3 (GOWN DISPOSABLE) ×4 IMPLANT
GOWN STRL REUS W/TWL LRG LVL3 (GOWN DISPOSABLE) ×4
IRRIGATOR SUCT 8 DISP DVNC XI (IRRIGATION / IRRIGATOR) IMPLANT
IRRIGATOR SUCTION 8MM XI DISP (IRRIGATION / IRRIGATOR)
IV NS 1000ML (IV SOLUTION)
IV NS 1000ML BAXH (IV SOLUTION) IMPLANT
KIT PINK PAD W/HEAD ARE REST (MISCELLANEOUS) ×1
KIT PINK PAD W/HEAD ARM REST (MISCELLANEOUS) ×1 IMPLANT
LABEL OR SOLS (LABEL) ×1 IMPLANT
MANIFOLD NEPTUNE II (INSTRUMENTS) ×1 IMPLANT
NEEDLE HYPO 22GX1.5 SAFETY (NEEDLE) ×1 IMPLANT
NS IRRIG 500ML POUR BTL (IV SOLUTION) ×1 IMPLANT
OBTURATOR OPTICAL STANDARD 8MM (TROCAR) ×1
OBTURATOR OPTICAL STND 8 DVNC (TROCAR) ×1
OBTURATOR OPTICALSTD 8 DVNC (TROCAR) ×1 IMPLANT
PACK LAP CHOLECYSTECTOMY (MISCELLANEOUS) ×1 IMPLANT
PENCIL SMOKE EVACUATOR (MISCELLANEOUS) ×1 IMPLANT
SEAL CANN UNIV 5-8 DVNC XI (MISCELLANEOUS) ×3 IMPLANT
SEAL XI 5MM-8MM UNIVERSAL (MISCELLANEOUS) ×3
SET TUBE FILTERED XL AIRSEAL (SET/KITS/TRAYS/PACK) IMPLANT
SET TUBE SMOKE EVAC HIGH FLOW (TUBING) ×1 IMPLANT
SOL ELECTROSURG ANTI STICK (MISCELLANEOUS) ×1
SOLUTION ELECTROSURG ANTI STCK (MISCELLANEOUS) ×1 IMPLANT
SPIKE FLUID TRANSFER (MISCELLANEOUS) ×1 IMPLANT
SPONGE T-LAP 18X18 ~~LOC~~+RFID (SPONGE) IMPLANT
SPONGE T-LAP 4X18 ~~LOC~~+RFID (SPONGE) ×1 IMPLANT
STAPLER CANNULA SEAL DVNC XI (STAPLE) ×1 IMPLANT
STAPLER CANNULA SEAL XI (STAPLE) ×1
SUT MNCRL AB 4-0 PS2 18 (SUTURE) ×1 IMPLANT
SUT VIC AB 3-0 SH 27 (SUTURE)
SUT VIC AB 3-0 SH 27X BRD (SUTURE) IMPLANT
SUT VICRYL 0 UR6 27IN ABS (SUTURE) ×2 IMPLANT
SYS BAG RETRIEVAL 10MM (BASKET) ×1
SYSTEM BAG RETRIEVAL 10MM (BASKET) ×1 IMPLANT
WATER STERILE IRR 500ML POUR (IV SOLUTION) ×1 IMPLANT

## 2022-06-23 NOTE — Consult Note (Addendum)
[facility] SURGICAL CONSULTATION NOTE (initial) - cpt: 99244   HISTORY OF PRESENT ILLNESS (HPI):  57 y.o. male presented to [facility] ED yesterday for evaluation of abdominal pain. Patient reports he ate cereal and grapes last night for dinner and had acute onset of severe RUQ and epigastric abdominal pain. This started an hour or two prior to presentation. He has been struggle with back and leg pain with multiple medication changes and thought this may be related to starting klonopin. No fever, chills, nausea, emesis, CP, SOB, urinary changes, bowel changes, or juandice. No history of similar in the past. No previous abdominal surgeries. Work up in the ED revealed a normal WBC at 6.6K (now 12.0K), renal function normal with sCr - 1.07, mild AST/ALT elevation, lipase elevated at 983 (now 521), bilirubin normal at 0.6. He initially had CT Abdomen/Pelvis concerning for pancreatic inflammation and changes around the gallbladder concerning for acute cholecystitis. RUQ [place] also concerning for cholecystitis. He did have MRCP which ruled out choledocholithiasis. He was admitted to the medicine service. Started on Zosyn.  Surgery is consulted by emergency medicine physician Dr. Acquanetta Belling, MD in this context for evaluation and management of gallstone pancreatitis.  PAST MEDICAL HISTORY (PMH):  Past Medical History:  Diagnosis Date   Anxiety    Chest pain 2006   hospital eval   Dyslipidemia    GERD (gastroesophageal reflux disease)    History of cardiovascular stress test 2006   [facility]   Hypertension      PAST SURGICAL HISTORY [facility]):  Past Surgical History:  Procedure Laterality Date   LACERATION REPAIR     left forearm, remote past   TESTICLE TORSION REDUCTION     teenage years     MEDICATIONS:  Prior to Admission medications   Medication Sig Start Date End Date Taking? Authorizing Provider  clonazePAM (KLONOPIN) 1 MG tablet Take 1 mg by mouth at bedtime as needed.  06/21/22  Yes [provider]  predniSONE (STERAPRED UNI-PAK 21 TAB) 10 MG (21) TBPK tablet Take by mouth. tbpk 06/17/22  Yes [provider]  escitalopram (LEXAPRO) 10 MG tablet Take 1 tablet (10 mg total) by mouth daily. 05/10/22   Mesner, Corene Cornea, MD  eszopiclone (LUNESTA) 2 MG TABS tablet Take 1 tablet (2 mg total) by mouth at bedtime as needed for sleep. Take immediately before bedtime 06/17/22   Tysinger, Camelia Eng, PA-C  Multiple Vitamin (MULTIVITAMIN) capsule Take 1 capsule by mouth daily.    [provider]  Omega-3 Fatty Acids (FISH OIL PO) Take by mouth.    [provider]  sildenafil (VIAGRA) 50 MG tablet TAKE 1 TABLET BY MOUTH AS NEEDED FOR ERECTILE DYSFUNCTION Patient not taking: Reported on 06/05/2022 11/22/21   Tysinger, Camelia Eng, PA-C  valsartan (DIOVAN) 80 MG tablet Take 1 tablet by mouth once daily 03/10/22   Tysinger, Camelia Eng, PA-C     ALLERGIES:  Allergies  Allergen Reactions   Lipitor [Atorvastatin]     Shakes, didn't feel good on it     SOCIAL HISTORY:  Social History   Socioeconomic History   Marital status: Married    Spouse name: Not on file   Number of children: Not on file   Years of education: Not on file   Highest education level: Not on file  Occupational History   Not on file  Tobacco Use   Smoking status: Never   Smokeless tobacco: Never  Vaping Use   Vaping Use: Never used  Substance and Sexual Activity   Alcohol use: No   Drug use: No   Sexual activity: Yes  Other Topics Concern   Not on file  Social History Narrative   Married, 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   01/2020   Social Determinants of Health   Financial Resource Strain: Not on file  Food Insecurity: Not on file  Transportation Needs: Not on file  Physical Activity: Not on file  Stress: Not on file  Social Connections: Not on file  Intimate Partner Violence: Not on file     FAMILY HISTORY:  Family History  Problem Relation  Age of Onset   Hypertension Mother    Hypothyroidism Mother    Diabetes Mother    Hypertension Sister    Other Father        history unknown   Hypertension Brother    Diabetes Brother    Cancer Maternal Aunt        breast   Stroke Maternal Uncle    Hypertension Brother    Hypertension Brother    Heart disease Neg Hx       REVIEW OF SYSTEMS:  Review of Systems  Constitutional:  Negative for chills and fever.  Respiratory:  Negative for cough and shortness of breath.   Cardiovascular:  Negative for chest pain and palpitations.  Gastrointestinal:  Positive for abdominal pain. Negative for constipation, diarrhea, nausea and vomiting.  Genitourinary:  Negative for dysuria and urgency.  All other systems reviewed and are negative.   VITAL SIGNS:  Temp:  [98.6 F (37 C)-98.7 F (37.1 C)] 98.6 F (37 C) (03/15 0800) Pulse Rate:  [60-77] 77 (03/15 0345) Resp:  [18-22] 20 (03/15 0345) BP: (138-174)/(77-99) 138/80 (03/15 0330) SpO2:  [94 %-100 %] 99 % (03/15 0345) Weight:  [86.2 kg] 86.2 kg (03/14 2158)     Height: 6' (182.9 cm) Weight: 86.2 kg BMI (Calculated): 25.76   INTAKE/OUTPUT:  No intake/output data recorded.  PHYSICAL EXAM:  Physical Exam Vitals and nursing note reviewed. Exam conducted with a chaperone present.  Constitutional:      General: He is not in acute distress.    Appearance: He is well-developed and normal weight. He is not ill-appearing.  HENT:     Head: Normocephalic and atraumatic.  Eyes:     General: No scleral icterus.    Extraocular Movements: Extraocular movements intact.  Cardiovascular:     Rate and Rhythm: Normal rate.     Heart sounds: Normal heart sounds. No murmur heard. Pulmonary:     Effort: Pulmonary effort is normal. No respiratory distress.  Abdominal:     General: Abdomen is flat. There is no distension.     Palpations: Abdomen is soft.     Tenderness: There is abdominal tenderness (Mild) in the epigastric area. There is no  guarding or rebound. Negative signs include Murphy's sign.     Comments: Abdomen is soft, mild epigastric soreness, non-distended, no rebound/guarding  Genitourinary:    Comments: Deferred  Skin:    General: Skin is warm and dry.     Coloration: Skin is not jaundiced.  Neurological:     General: No focal deficit present.     Mental Status: He is alert and oriented to person, place, and time.  Psychiatric:        Mood and Affect: Mood normal.        Behavior: Behavior normal.      Labs:     Latest Ref Rng &  Units 06/23/2022    6:27 AM 06/22/2022   10:06 PM 04/07/2021    2:16 PM  CBC  WBC 4.0 - 10.5 K/uL 12.2  6.6  4.5   Hemoglobin 13.0 - 17.0 g/dL 14.1  13.0  15.4   Hematocrit 39.0 - 52.0 % 44.3  39.0  45.2   Platelets 150 - 400 K/uL 227  338  300       Latest Ref Rng & Units 06/23/2022    7:00 AM 06/22/2022   10:06 PM 04/07/2021    2:16 PM  CMP  Glucose 70 - 99 mg/dL 144  173  98   BUN 6 - 20 mg/dL 13  18  15    Creatinine 0.61 - 1.24 mg/dL 0.93  1.07  1.18   Sodium 135 - 145 mmol/L 140  136  140   Potassium 3.5 - 5.1 mmol/L 3.6  4.0  4.8   Chloride 98 - 111 mmol/L 101  99  102   CO2 22 - 32 mmol/L 24  29  26    Calcium 8.9 - 10.3 mg/dL 9.3  9.0  9.9   Total Protein 6.5 - 8.1 g/dL 7.0  7.2  7.5   Total Bilirubin 0.3 - 1.2 mg/dL 0.8  0.6  0.7   Alkaline Phos 38 - 126 U/L 72  61  90   AST 15 - 41 U/L 130  97  25   ALT 0 - 44 U/L 153  54  27      Imaging studies:   CT Abdomen/Pelvis (06/23/2022) personally reviewed which does show pancreatic inflammation as well as thickening and inflammation about the gallbladder overall concerning for acute cholecystitis and pancreatitis, and radiologist report reviewed below:  IMPRESSION: 1. Acute proximal pancreatitis.  No pseudocyst formation 2. Cholelithiasis with findings suggestive of acute cholecystitis. Recommend right upper quadrant ultrasound. 3. Colonic diverticulosis with no acute diverticulitis. 4. Prostatomegaly. 5.  Aortic Atherosclerosis (ICD10-I70.0).    RUQ [place] (06/23/2022) personally reviewed with cholelithiasis and changes concerning for cholecystitis, and radiologist report reviewed below:  IMPRESSION: Gallbladder sludge and stones with gallbladder wall thickening and pericholecystic fluid. Findings are concerning for acute cholecystitis in the appropriate clinical setting. The sonographic Percell Miller sign could not be assessed because the patient had been Medicated .  MRCP (06/23/2022) personally reviewed without choledocholithiasis, and radiologist report reviewed below:  IMPRESSION: 1. Imaging findings compatible with acute pancreatitis involving the head of pancreas. No signs of pseudocyst formation or necrosis. 2. Gallbladder sludge. The 3 mm stones noted on ultrasound are not confidently identified, but be obscured by sludge. There is diffuse gallbladder wall edema which in the setting of acute pancreatitis is nonspecific. 3. No signs of choledocholithiasis. 4. Hepatic steatosis. 5. Moderate retained stool within the visualized portions of the colon. Correlate for any clinical signs or symptoms of constipation.   Assessment/Plan: (ICD-10's: K85.10, K81.0) 57 y.o. male with gallstone pancreatitis and likely concomitant cholecystitis.   - Appreciate medicine admission - Although clinically his pain is better and lipase improved, he does have significant inflammatory changes to the RUQ on MRCP secondary to gallstone pancreatitis. Discussed with Dr Hampton Abbot and felt given significant inflammatory changes seen on MRI, it would be more prudent to delay surgery at this time and pursue this as an outpatient as long as he continues to do well clinically.   - NPO for today given pain; Okay for diet tomorrow.  - IV Abx (Zosyn) - Monitor abdominal examination - Monitor lipase; improving - Pain control  prn; antiemetics prn   - Mobilize   - Further management per primary service; we will follow    All of the above findings and recommendations were discussed with the patient, and all of patient's questions were answered to his expressed satisfaction.  Thank you for the opportunity to participate in this patient's care.   -- Edison Simon, PA-C [facility] 06/23/2022, 9:05 AM M-F: 7am - 4pm

## 2022-06-23 NOTE — ED Notes (Signed)
Pt denies needing medication for anxiety to conduct MRI.

## 2022-06-23 NOTE — Assessment & Plan Note (Signed)
PPI ?

## 2022-06-23 NOTE — ED Provider Notes (Addendum)
Patient signed out to me pending CT.  In brief this is a 57 year old male hypertension GERD who presents because of abdominal pain.  Lipase is elevated.  CT of the abdomen pelvis shows proximal pancreatitis as well as findings concerning for cholecystitis.  Right upper quadrant ultrasound obtained is also consistent with acute cholecystitis.  AST and ALT are just mildly elevated and CBD is normal so low suspicion for choledocholithiasis.  I discussed with Dr. Hampton Abbot.  Patient will be admitted.  Will cover with Zosyn.   I discussed with Dr. Hampton Abbot who does not feel that he needs an MRCP at this time.  Recommends medical admission and they will see the patient in the a.m. and discuss timing of cholecystectomy.  Discussed with Dr. Damita Dunnings who would like to get an MRCP prior to admission.  MRCP is negative for any CBD stone.  Patient can be admitted to medicine service.   Rada Hay, MD 06/23/22 [id number]    Rada Hay, MD 06/23/22 [id number]    Rada Hay, MD 06/23/22 [phone]

## 2022-06-23 NOTE — Assessment & Plan Note (Signed)
Lipid panel ordered.

## 2022-06-23 NOTE — Progress Notes (Addendum)
Blood culture 1/2 w/ GNR- Ecoli + in setting cholecystitis and pancreatitis  On zosyn  Will continue pending speciation and sensitivities.  Follow closely

## 2022-06-23 NOTE — Assessment & Plan Note (Signed)
New onset pancreatitis in the setting of acute cholecystitis Lipase in the 900s LFTs fairly stable in 50s to 60s No common bile duct involvement on MRCP Will monitor for now pain control IV fluids Will keep n.p.o. in the setting of cholecystitis as patient will likely obtain elective cholecystectomy in conjunction with general surgery evaluation Follow

## 2022-06-23 NOTE — ED Notes (Signed)
Patient transported to MRI 

## 2022-06-23 NOTE — Assessment & Plan Note (Signed)
BP stable Titrate regimen

## 2022-06-23 NOTE — Progress Notes (Signed)
PHARMACY - PHYSICIAN COMMUNICATION CRITICAL VALUE ALERT - BLOOD CULTURE IDENTIFICATION (BCID)  Omar Phillips is an 57 y.o. male who presented to [facility] on 06/22/2022 with a chief complaint of abdominal pain  Assessment: blood culture with GNR, BCID detects E coli.   Source acute cholecystitis  Name of physician (or Provider) Contacted: Dr Ernestina Patches  Current antibiotics: piperacillin/tazobactam  Changes to prescribed antibiotics recommended:  Patient is on recommended antibiotics - No changes needed Await final cultures and susceptibilities  Results for orders placed or performed during the hospital encounter of 06/22/22  Blood Culture ID Panel (Reflexed) (Collected: 06/23/2022  3:47 AM)  Result Value Ref Range   Enterococcus faecalis NOT DETECTED NOT DETECTED   Enterococcus Faecium NOT DETECTED NOT DETECTED   Listeria monocytogenes NOT DETECTED NOT DETECTED   Staphylococcus species NOT DETECTED NOT DETECTED   Staphylococcus aureus (BCID) NOT DETECTED NOT DETECTED   Staphylococcus epidermidis NOT DETECTED NOT DETECTED   Staphylococcus lugdunensis NOT DETECTED NOT DETECTED   Streptococcus species NOT DETECTED NOT DETECTED   Streptococcus agalactiae NOT DETECTED NOT DETECTED   Streptococcus pneumoniae NOT DETECTED NOT DETECTED   Streptococcus pyogenes NOT DETECTED NOT DETECTED   A.calcoaceticus-baumannii NOT DETECTED NOT DETECTED   Bacteroides fragilis NOT DETECTED NOT DETECTED   Enterobacterales DETECTED (A) NOT DETECTED   Enterobacter cloacae complex NOT DETECTED NOT DETECTED   Escherichia coli DETECTED (A) NOT DETECTED   Klebsiella aerogenes NOT DETECTED NOT DETECTED   Klebsiella oxytoca NOT DETECTED NOT DETECTED   Klebsiella pneumoniae NOT DETECTED NOT DETECTED   Proteus species NOT DETECTED NOT DETECTED   Salmonella species NOT DETECTED NOT DETECTED   Serratia marcescens NOT DETECTED NOT DETECTED   Haemophilus influenzae NOT DETECTED NOT DETECTED   Neisseria  meningitidis NOT DETECTED NOT DETECTED   Pseudomonas aeruginosa NOT DETECTED NOT DETECTED   Stenotrophomonas maltophilia NOT DETECTED NOT DETECTED   Candida albicans NOT DETECTED NOT DETECTED   Candida auris NOT DETECTED NOT DETECTED   Candida glabrata NOT DETECTED NOT DETECTED   Candida krusei NOT DETECTED NOT DETECTED   Candida parapsilosis NOT DETECTED NOT DETECTED   Candida tropicalis NOT DETECTED NOT DETECTED   Cryptococcus neoformans/gattii NOT DETECTED NOT DETECTED   CTX-M ESBL NOT DETECTED NOT DETECTED   Carbapenem resistance IMP NOT DETECTED NOT DETECTED   Carbapenem resistance KPC NOT DETECTED NOT DETECTED   Carbapenem resistance NDM NOT DETECTED NOT DETECTED   Carbapenem resist OXA 48 LIKE NOT DETECTED NOT DETECTED   Carbapenem resistance VIM NOT DETECTED NOT DETECTED   Doreene Eland, PharmD, BCPS, BCIDP Work Cell: [phone] 06/23/2022 3:39 PM

## 2022-06-23 NOTE — Assessment & Plan Note (Addendum)
Acute cholecystitis on presentation with worsening central abdominal pain. Noted gallbladder wall thickening and gallbladder sludge on imaging LFTs relatively stable at present-AST 97, ALT 51 T. bili within normal limits No choledocholithiasis noted on MRCP General surgery aware of case we will plan for formal consultation Started on IV Zosyn in the ER Will continue Pending tentative surgical evaluation pending resolution of pancreatitis per report Follow-up formal surgical recommendations

## 2022-06-23 NOTE — H&P (Addendum)
History and Physical    Patient: Omar Phillips DOB: [birth year] DOA: 06/22/2022 DOS: the patient was seen and examined on 06/23/2022 PCP: Carlena Hurl, PA-C  Patient coming from: Home  Chief Complaint:  Chief Complaint  Patient presents with   Abdominal Pain   HPI: Omar Phillips is a 57 y.o. male with medical history significant of anxiety, hyperlipidemia, GERD, hypertension presenting with cholecystitis and pancreatitis.  Patient reports sudden onset of central abdominal pain around yesterday.  Patient states he was recently given Klonopin for anxiety and rest.  Symptoms began immediately after taking medication per report.  Mild nausea.  No diaphoresis or chest pain.  Abdominal pain is predominately more in the central abdomen with some radiation to the back.  No diarrhea.  Baseline history of high cholesterol.  Not on a statin.  Patient does admit to eating a high fat diet.  No fevers or chills.  Symptoms progressively worsened over the course of the day into the night. Presented to the ER afebrile, hemodynamically stable.  White count 6.6, hemoglobin 13, platelets 338.  Creatinine 1.07, AST 97, ALT 54, T. bili 0.6.  Glucose 173.  Urinalysis grossly within normal limits.  Lipase 93.  CT abdomen pelvis showing acute proximal pancreatitis as well as cholelithiasis and concern for cholecystitis.  Right upper quadrant ultrasound showed gallbladder sludge and stones or gallbladder wall thickening concerning for acute cholecystitis.  Follow-up MRCP obtained showing gallbladder sludge and gallbladder wall edema with no signs of choledocholithiasis.  General surgery consulted with plan for medical admission with [facility] hospitalist. Review of Systems: As mentioned in the history of present illness. All other systems reviewed and are negative. Past Medical History:  Diagnosis Date   Anxiety    Chest pain 2006   hospital eval   Dyslipidemia    GERD (gastroesophageal reflux  disease)    History of cardiovascular stress test 2006   [place]   Hypertension    Past Surgical History:  Procedure Laterality Date   LACERATION REPAIR     left forearm, remote past   TESTICLE TORSION REDUCTION     teenage years   Social History:  reports that he has never smoked. He has never used smokeless tobacco. He reports that he does not drink alcohol and does not use drugs.  Allergies  Allergen Reactions   Lipitor [Atorvastatin]     Shakes, didn't feel good on it    Family History  Problem Relation Age of Onset   Hypertension Mother    Hypothyroidism Mother    Diabetes Mother    Hypertension Sister    Other Father        history unknown   Hypertension Brother    Diabetes Brother    Cancer Maternal Aunt        breast   Stroke Maternal Uncle    Hypertension Brother    Hypertension Brother    Heart disease Neg Hx     Prior to Admission medications   Medication Sig Start Date End Date Taking? Authorizing Provider  clonazePAM (KLONOPIN) 1 MG tablet Take 1 mg by mouth at bedtime as needed. 06/21/22  Yes [provider]  predniSONE (STERAPRED UNI-PAK 21 TAB) 10 MG (21) TBPK tablet Take by mouth. tbpk 06/17/22  Yes [provider]  escitalopram (LEXAPRO) 10 MG tablet Take 1 tablet (10 mg total) by mouth daily. 05/10/22   Mesner, Corene Cornea, MD  eszopiclone (LUNESTA) 2 MG TABS tablet Take 1 tablet (2 mg  total) by mouth at bedtime as needed for sleep. Take immediately before bedtime 06/17/22   Tysinger, Camelia Eng, PA-C  Multiple Vitamin (MULTIVITAMIN) capsule Take 1 capsule by mouth daily.    [provider]  Omega-3 Fatty Acids (FISH OIL PO) Take by mouth.    [provider]  sildenafil (VIAGRA) 50 MG tablet TAKE 1 TABLET BY MOUTH AS NEEDED FOR ERECTILE DYSFUNCTION Patient not taking: Reported on 06/05/2022 11/22/21   Tysinger, Camelia Eng, PA-C  valsartan (DIOVAN) 80 MG tablet Take 1 tablet by mouth once daily 03/10/22   Carlena Hurl, PA-C     Physical Exam: Vitals:   06/23/22 0315 06/23/22 0330 06/23/22 0345 06/23/22 0356  BP:  138/80    Pulse: 72 71 77   Resp: 18 18 20    Temp:    98.6 F (37 C)  TempSrc:    Oral  SpO2: 95% 94% 99%   Weight:      Height:       Physical Exam Constitutional:      General: He is not in acute distress.    Appearance: He is normal weight.  HENT:     Head: Normocephalic and atraumatic.     Mouth/Throat:     Mouth: Mucous membranes are moist.  Eyes:     Pupils: Pupils are equal, round, and reactive to light.  Cardiovascular:     Rate and Rhythm: Normal rate and regular rhythm.  Pulmonary:     Effort: Pulmonary effort is normal.  Abdominal:     General: Bowel sounds are normal.     Comments: Mild central abdominal pain  Musculoskeletal:        General: Normal range of motion.     Cervical back: Normal range of motion.  Skin:    General: Skin is warm.  Neurological:     General: No focal deficit present.  Psychiatric:        Mood and Affect: Mood normal.     Data Reviewed:  There are no new results to review at this time. MR 3D Recon At Scanner CLINICAL DATA:  Cholelithiasis. Right upper quadrant pain and nausea.  EXAM: MRI ABDOMEN WITHOUT AND WITH CONTRAST (INCLUDING MRCP)  TECHNIQUE: Multiplanar multisequence MR imaging of the abdomen was performed both before and after the administration of intravenous contrast. Heavily T2-weighted images of the biliary and pancreatic ducts were obtained, and three-dimensional MRCP images were rendered by post processing.  CONTRAST:  41mL GADAVIST GADOBUTROL 1 MMOL/ML IV SOLN  COMPARISON:  CT 06/23/2022 and abdominal sonogram 06/23/2022  FINDINGS: Lower chest: No acute findings.  Hepatobiliary: Hepatic steatosis. No focal liver abnormality. There is sludge noted layering within the gallbladder fundus, image 24/4. The small stones noted on ultrasound are difficult to visualize and may be obscured by sludge. Diffuse  gallbladder wall edema measures up to 5 mm. Mild pericholecystic fluid identified. The CBD measures up to 6 mm in diameter. No signs of choledocholithiasis.  Pancreas: There is diffuse scratch set diffuse edema and enlargement involving the head of pancreas is identified with surrounding inflammatory fat stranding with fluid extending into the right retroperitoneum. No significant main duct dilatation. No discrete mass. No signs of pseudocyst formation.  Spleen:  Within normal limits in size and appearance.  Adrenals/Urinary Tract: No masses identified. No evidence of hydronephrosis.  Stomach/Bowel: Stomach is normal. Duodenal diverticulum identified. No bowel wall thickening, inflammation or distension noted. Moderate retained stool noted within the visualized portions of the colon.  Vascular/Lymphatic: The  upper abdominal vascularity is patent. No aneurysm. No abdominal adenopathy.  Other: Free fluid extends from the region of the head of pancreas into the right retroperitoneum and along the right pericolic gutter. No focal fluid collections.  Musculoskeletal: No suspicious bone lesions identified.  IMPRESSION: 1. Imaging findings compatible with acute pancreatitis involving the head of pancreas. No signs of pseudocyst formation or necrosis. 2. Gallbladder sludge. The 3 mm stones noted on ultrasound are not confidently identified, but be obscured by sludge. There is diffuse gallbladder wall edema which in the setting of acute pancreatitis is nonspecific. 3. No signs of choledocholithiasis. 4. Hepatic steatosis. 5. Moderate retained stool within the visualized portions of the colon. Correlate for any clinical signs or symptoms of constipation.  Electronically Signed   By: Kerby Moors M.D.   On: 06/23/2022 05:52 MR ABDOMEN MRCP W WO CONTAST CLINICAL DATA:  Cholelithiasis. Right upper quadrant pain and nausea.  EXAM: MRI ABDOMEN WITHOUT AND WITH CONTRAST (INCLUDING  MRCP)  TECHNIQUE: Multiplanar multisequence MR imaging of the abdomen was performed both before and after the administration of intravenous contrast. Heavily T2-weighted images of the biliary and pancreatic ducts were obtained, and three-dimensional MRCP images were rendered by post processing.  CONTRAST:  54mL GADAVIST GADOBUTROL 1 MMOL/ML IV SOLN  COMPARISON:  CT 06/23/2022 and abdominal sonogram 06/23/2022  FINDINGS: Lower chest: No acute findings.  Hepatobiliary: Hepatic steatosis. No focal liver abnormality. There is sludge noted layering within the gallbladder fundus, image 24/4. The small stones noted on ultrasound are difficult to visualize and may be obscured by sludge. Diffuse gallbladder wall edema measures up to 5 mm. Mild pericholecystic fluid identified. The CBD measures up to 6 mm in diameter. No signs of choledocholithiasis.  Pancreas: There is diffuse scratch set diffuse edema and enlargement involving the head of pancreas is identified with surrounding inflammatory fat stranding with fluid extending into the right retroperitoneum. No significant main duct dilatation. No discrete mass. No signs of pseudocyst formation.  Spleen:  Within normal limits in size and appearance.  Adrenals/Urinary Tract: No masses identified. No evidence of hydronephrosis.  Stomach/Bowel: Stomach is normal. Duodenal diverticulum identified. No bowel wall thickening, inflammation or distension noted. Moderate retained stool noted within the visualized portions of the colon.  Vascular/Lymphatic: The upper abdominal vascularity is patent. No aneurysm. No abdominal adenopathy.  Other: Free fluid extends from the region of the head of pancreas into the right retroperitoneum and along the right pericolic gutter. No focal fluid collections.  Musculoskeletal: No suspicious bone lesions identified.  IMPRESSION: 1. Imaging findings compatible with acute pancreatitis involving the head  of pancreas. No signs of pseudocyst formation or necrosis. 2. Gallbladder sludge. The 3 mm stones noted on ultrasound are not confidently identified, but be obscured by sludge. There is diffuse gallbladder wall edema which in the setting of acute pancreatitis is nonspecific. 3. No signs of choledocholithiasis. 4. Hepatic steatosis. 5. Moderate retained stool within the visualized portions of the colon. Correlate for any clinical signs or symptoms of constipation.  Electronically Signed   By: Kerby Moors M.D.   On: 06/23/2022 05:52 US ABDOMEN LIMITED RUQ (LIVER/GB) CLINICAL DATA:  Right upper quadrant pain  EXAM: ULTRASOUND ABDOMEN LIMITED RIGHT UPPER QUADRANT  COMPARISON:  None Available.  FINDINGS: Gallbladder:  There is gallbladder sludge and stones. Wall thickness measures 6 mm. Small amount of pericholecystic fluid. The sonographic Percell Miller sign could not be assessed because the patient had been medicated.  Common bile duct:  Diameter: 5 mm  Liver:  No focal lesion identified. Within normal limits in parenchymal echogenicity. Portal vein is patent on color Doppler imaging with normal direction of blood flow towards the liver.  Other: None.  IMPRESSION: Gallbladder sludge and stones with gallbladder wall thickening and pericholecystic fluid. Findings are concerning for acute cholecystitis in the appropriate clinical setting. The sonographic Percell Miller sign could not be assessed because the patient had been medicated.  Electronically Signed   By: Ulyses Jarred M.D.   On: 06/23/2022 02:49 CT ABDOMEN PELVIS W CONTRAST CLINICAL DATA:  pt presents ambulatory to triage via POV with complaints of epigastric pain that started 30 mins ago. Rates the pain 10/10 and its sharp in nature. No meds taken PTA. Hx of GERD.  EXAM: CT ABDOMEN AND PELVIS WITH CONTRAST  TECHNIQUE: Multidetector CT imaging of the abdomen and pelvis was performed using the standard protocol  following bolus administration of intravenous contrast.  RADIATION DOSE REDUCTION: This exam was performed according to the departmental dose-optimization program which includes automated exposure control, adjustment of the mA and/or kV according to patient size and/or use of iterative reconstruction technique.  CONTRAST:  116mL OMNIPAQUE IOHEXOL 300 MG/ML  SOLN  COMPARISON:  CT abdomen pelvis 08/05/2002 report without imaging  FINDINGS: Lower chest: Bilateral lower lobe subsegmental atelectasis. No acute abnormality.  Hepatobiliary: No focal liver abnormality. Calcified gallstone noted within the gallbladder lumen. Question gallbladder wall thickening. Pericholecystic fluid. No biliary dilatation. No pseudocyst formation.  Pancreas: Mild peripancreatic fat stranding and hazy pancreatic contour along the proximal pancreas. No main pancreatic ductal dilatation.  Spleen: Normal in size without focal abnormality.  Adrenals/Urinary Tract:  No adrenal nodule bilaterally.  Bilateral kidneys enhance symmetrically.  No hydronephrosis. No hydroureter.  The urinary bladder is unremarkable.  On delayed imaging, there is no urothelial wall thickening and there are no filling defects in the opacified portions of the bilateral collecting systems or ureters.  Stomach/Bowel: Stomach is within normal limits. 2/3 portion of the duodenum diverticula. No evidence of bowel wall thickening or dilatation. Colonic diverticulosis. Appendix appears normal.  Vascular/Lymphatic: No abdominal aorta or iliac aneurysm. Mild atherosclerotic plaque of the aorta and its branches. No abdominal, pelvic, or inguinal lymphadenopathy.  Reproductive: Prostate enlarged measuring up to 5.3 cm.  Other: No intraperitoneal free fluid. No intraperitoneal free gas. No organized fluid collection.  Musculoskeletal:  No abdominal wall hernia or abnormality.  No suspicious lytic or blastic osseous lesions. No  acute displaced fracture. Mild retrolisthesis of L4 on L5.  IMPRESSION: 1. Acute proximal pancreatitis.  No pseudocyst formation 2. Cholelithiasis with findings suggestive of acute cholecystitis. Recommend right upper quadrant ultrasound. 3. Colonic diverticulosis with no acute diverticulitis. 4. Prostatomegaly. 5. Aortic Atherosclerosis (ICD10-I70.0).  Electronically Signed   By: Iven Finn M.D.   On: 06/23/2022 00:54   Lab Results  Component Value Date   WBC 12.2 (H) 06/23/2022   HGB 14.1 06/23/2022   HCT 44.3 06/23/2022   MCV 98.2 06/23/2022   PLT 227 [id number]   Last metabolic panel Lab Results  Component Value Date   GLUCOSE 173 (H) 06/22/2022   NA 136 06/22/2022   K 4.0 06/22/2022   CL 99 06/22/2022   CO2 29 06/22/2022   BUN 18 06/22/2022   CREATININE 1.07 06/22/2022   GFRNONAA >60 06/22/2022   CALCIUM 9.0 06/22/2022   PROT 7.2 06/22/2022   ALBUMIN 4.1 06/22/2022   LABGLOB 2.7 04/07/2021   AGRATIO 1.8 04/07/2021   BILITOT 0.6 06/22/2022   ALKPHOS 61 06/22/2022  AST 97 (H) 06/22/2022   ALT 54 (H) 06/22/2022   ANIONGAP 8 06/22/2022   Lipase     Component Value Date/Time   LIPASE 983 (H) 06/22/2022 2206    Assessment and Plan: * Gallstone pancreatitis New onset pancreatitis in the setting of acute cholecystitis Lipase in the 900s LFTs fairly stable in 50s to 60s No common bile duct involvement on MRCP Will monitor for now pain control IV fluids Will keep n.p.o. in the setting of cholecystitis as patient will likely obtain elective cholecystectomy in conjunction with general surgery evaluation Follow  Acute cholecystitis Acute cholecystitis on presentation with worsening central abdominal pain. Noted gallbladder wall thickening and gallbladder sludge on imaging LFTs relatively stable at present-AST 97, ALT 51 T. bili within normal limits No choledocholithiasis noted on MRCP General surgery aware of case we will plan for formal  consultation Started on IV Zosyn in the ER Will continue Pending tentative surgical evaluation pending resolution of pancreatitis per report Follow-up formal surgical recommendations  Gastroesophageal reflux disease without esophagitis PPI  Essential hypertension BP stable Titrate regimen      Advance Care Planning:   Code Status: Full Code   Consults: General Surgery  Family Communication: Sister at the bedside   Severity of Illness: The appropriate patient status for this patient is INPATIENT. Inpatient status is judged to be reasonable and necessary in order to provide the required intensity of service to ensure the patient's safety. The patient's presenting symptoms, physical exam findings, and initial radiographic and laboratory data in the context of their chronic comorbidities is felt to place them at high risk for further clinical deterioration. Furthermore, it is not anticipated that the patient will be medically stable for discharge from the hospital within 2 midnights of admission.   * I certify that at the point of admission it is my clinical judgment that the patient will require inpatient hospital care spanning beyond 2 midnights from the point of admission due to high intensity of service, high risk for further deterioration and high frequency of surveillance required.*  Author: Deneise Lever, MD 06/23/2022 8:11 AM  For on call review [email or web address].

## 2022-06-24 ENCOUNTER — Encounter: Payer: Self-pay | Admitting: Family Medicine

## 2022-06-24 DIAGNOSIS — R7881 Bacteremia: Secondary | ICD-10-CM | POA: Diagnosis present

## 2022-06-24 DIAGNOSIS — B9620 Unspecified Escherichia coli [E. coli] as the cause of diseases classified elsewhere: Secondary | ICD-10-CM

## 2022-06-24 LAB — COMPREHENSIVE METABOLIC PANEL
ALT: 92 U/L — ABNORMAL HIGH (ref 0–44)
AST: 39 U/L (ref 15–41)
Albumin: 3.5 g/dL (ref 3.5–5.0)
Alkaline Phosphatase: 62 U/L (ref 38–126)
Anion gap: 8 (ref 5–15)
BUN: 11 mg/dL (ref 6–20)
CO2: 27 mmol/L (ref 22–32)
Calcium: 9 mg/dL (ref 8.9–10.3)
Chloride: 102 mmol/L (ref 98–111)
Creatinine, Ser: 1.02 mg/dL (ref 0.61–1.24)
GFR, Estimated: >60 mL/min (ref >60)
Glucose, Bld: 112 mg/dL — ABNORMAL HIGH (ref 70–99)
Potassium: 3.6 mmol/L (ref 3.5–5.1)
Sodium: 137 mmol/L (ref 135–145)
Total Bilirubin: 1.4 mg/dL — ABNORMAL HIGH (ref 0.3–1.2)
Total Protein: 6.4 g/dL — ABNORMAL LOW (ref 6.5–8.1)

## 2022-06-24 LAB — CBC
HCT: 35.9 % — ABNORMAL LOW (ref 39.0–52.0)
Hemoglobin: 12.1 g/dL — ABNORMAL LOW (ref 13.0–17.0)
MCH: 31.3 pg (ref 26.0–34.0)
MCHC: 33.7 g/dL (ref 30.0–36.0)
MCV: 93 fL (ref 80.0–100.0)
Platelets: 294 10*3/uL (ref 150–400)
RBC: 3.86 MIL/uL — ABNORMAL LOW (ref 4.22–5.81)
RDW: 11.9 % (ref 11.5–15.5)
WBC: 6.6 10*3/uL (ref 4.0–10.5)
nRBC: 0 % (ref 0.0–0.2)

## 2022-06-24 LAB — MAGNESIUM: Magnesium: 1.9 mg/dL (ref 1.7–2.4)

## 2022-06-24 LAB — LIPASE, BLOOD: Lipase: 134 U/L — ABNORMAL HIGH (ref 11–51)

## 2022-06-24 MED ORDER — LACTATED RINGERS IV SOLN: INTRAVENOUS | Status: AC

## 2022-06-24 MED ORDER — KETOROLAC TROMETHAMINE 15 MG/ML IJ SOLN
15.0000 mg | Freq: Once | INTRAMUSCULAR | Status: AC
  Administered 2022-06-24: 15 mg via INTRAVENOUS
  Filled 2022-06-24: qty 1

## 2022-06-24 NOTE — Progress Notes (Addendum)
Progress Note    Omar Phillips  [id number] DOB: 03-08-1966  DOA: 06/22/2022 PCP: Carlena Hurl, PA-C      Brief Narrative:    Medical records reviewed and are as summarized below:  Omar Phillips is a 57 y.o. male with medical history significant for anxiety, hyperlipidemia, hypertension, GERD, who presented to the hospital with abdominal pain and nausea.  He was recently started on Klonopin for anxiety.  Workup reviewed acute gallstone pancreatitis, acute cholecystitis.  He was treated with analgesics, IV fluids and empiric IV antibiotics.      Assessment/Plan:   Principal Problem:   Gallstone pancreatitis Active Problems:   Hypertension   Gastroesophageal reflux disease without esophagitis   Dyslipidemia   Pancreatitis   Acute cholecystitis   E coli bacteremia    Body mass index is 25.77 kg/m.  Acute cholecystitis, E. coli bacteremia: Continue IV Zosyn.  Start clear liquid diet (discussed with Dr. Dahlia Byes, general surgeon).  Follow-up blood culture sensitivity report.  Follow-up with general surgeon.   Acute gallstone pancreatitis: No evidence of choledocholithiasis on MRCP abdomen.  Analgesics as needed for pain.  Continue IV fluids.   Other comorbidities include hypertension, anxiety, GERD      Diet Order             Diet Heart Room service appropriate? Yes; Fluid consistency: Thin  Diet effective 1400           Diet clear liquid Room service appropriate? Yes; Fluid consistency: Thin  Diet effective now                            Consultants: General surgeon  Procedures: None    Medications:    enoxaparin (LOVENOX) injection  40 mg Subcutaneous Q24H   Continuous Infusions:  lactated ringers     piperacillin-tazobactam 3.375 g (06/24/22 0620)     Anti-infectives (From admission, onward)    Start     Dose/Rate Route Frequency Ordered Stop   06/23/22 1000  piperacillin-tazobactam (ZOSYN) IVPB 3.375 g         3.375 g 12.5 mL/hr over 240 Minutes Intravenous Every 8 hours 06/23/22 0805     06/23/22 0315  piperacillin-tazobactam (ZOSYN) IVPB 3.375 g        3.375 g 100 mL/hr over 30 Minutes Intravenous  Once 06/23/22 0304 06/23/22 0424              Family Communication/Anticipated D/C date and plan/Code Status   DVT prophylaxis: enoxaparin (LOVENOX) injection 40 mg Start: 06/23/22 1000 SCDs Start: 06/23/22 0757     Code Status: Full Code  Family Communication: Plan discussed with his wife on speaker phone in patient's room Disposition Plan: Plan to discharge home in 2 to 3 days   Status is: Inpatient Remains inpatient appropriate because: IV antibiotics for cholecystitis, acute pancreatitis       Subjective:   Interval events noted.  Abdominal pain is better.  No nausea or vomiting.  Objective:    Vitals:   06/23/22 0954 06/23/22 1715 06/24/22 0010 06/24/22 0725  BP:  124/74 116/71 129/69  Pulse:  (!) 57 (!) 54 61  Resp: 18 18 15 16   Temp:  98.1 F (36.7 C) 98.1 F (36.7 C) 98.1 F (36.7 C)  TempSrc:  Oral    SpO2:  100% 99% 99%  Weight:      Height:       No data found.  Intake/Output Summary (Last 24 hours) at 06/24/2022 1052 Last data filed at 06/24/2022 0500 Gross per 24 hour  Intake 1472.25 ml  Output --  Net 1472.25 ml   Filed Weights   06/22/22 2158  Weight: 86.2 kg    Exam:  GEN: NAD SKIN: No rash EYES: EOMI ENT: MMM CV: RRR PULM: CTA B ABD: soft, ND, mild right upper quadrant and epigastric tenderness, no rebound tenderness or guarding, +BS CNS: AAO x 3, non focal EXT: No edema or tenderness        Data Reviewed:   I have personally reviewed following labs and imaging studies:  Labs: Labs show the following:   Basic Metabolic Panel: Recent Labs  Lab 06/22/22 2206 06/23/22 0700 06/24/22 0400  NA 136 140 137  K 4.0 3.6 3.6  CL 99 101 102  CO2 29 24 27   GLUCOSE 173* 144* 112*  BUN 18 13 11   CREATININE 1.07  0.93 1.02  CALCIUM 9.0 9.3 9.0  MG  --   --  1.9   GFR Estimated Creatinine Clearance: 89.8 mL/min (by C-G formula based on SCr of 1.02 mg/dL). Liver Function Tests: Recent Labs  Lab 06/22/22 2206 06/23/22 0700 06/24/22 0400  AST 97* 130* 39  ALT 54* 153* 92*  ALKPHOS 61 72 62  BILITOT 0.6 0.8 1.4*  PROT 7.2 7.0 6.4*  ALBUMIN 4.1 3.9 3.5   Recent Labs  Lab 06/22/22 2206 06/23/22 0700 06/24/22 0355  LIPASE 983* 521* 134*   No results for input(s): "AMMONIA" in the last 168 hours. Coagulation profile No results for input(s): "INR", "PROTIME" in the last 168 hours.  CBC: Recent Labs  Lab 06/22/22 2206 06/23/22 0627 06/23/22 0945 06/24/22 0400  WBC 6.6 12.2* 12.0* 6.6  HGB 13.0 14.1 13.0 12.1*  HCT 39.0 44.3 39.0 35.9*  MCV 93.3 98.2 92.4 93.0  PLT 338 227 319 294   Cardiac Enzymes: No results for input(s): "CKTOTAL", "CKMB", "CKMBINDEX", "TROPONINI" in the last 168 hours. BNP (last 3 results) No results for input(s): "PROBNP" in the last 8760 hours. CBG: No results for input(s): "GLUCAP" in the last 168 hours. D-Dimer: No results for input(s): "DDIMER" in the last 72 hours. Hgb A1c: No results for input(s): "HGBA1C" in the last 72 hours. Lipid Profile: Recent Labs    06/23/22 0945  CHOL 233*  HDL 58  LDLCALC 153*  TRIG 110  CHOLHDL 4.0   Thyroid function studies: No results for input(s): "TSH", "T4TOTAL", "T3FREE", "THYROIDAB" in the last 72 hours.  Invalid input(s): "FREET3" Anemia work up: No results for input(s): "VITAMINB12", "FOLATE", "FERRITIN", "TIBC", "IRON", "RETICCTPCT" in the last 72 hours. Sepsis Labs: Recent Labs  Lab 06/22/22 2206 06/23/22 0627 06/23/22 0945 06/24/22 0400  WBC 6.6 12.2* 12.0* 6.6    Microbiology Recent Results (from the past 240 hour(s))  Blood culture (routine x 2)     Status: None (Preliminary result)   Collection Time: 06/23/22  3:47 AM   Specimen: BLOOD  Result Value Ref Range Status   Specimen  Description BLOOD BLOOD RIGHT HAND  Final   Special Requests   Final    BOTTLES DRAWN AEROBIC AND ANAEROBIC Blood Culture adequate volume   Culture   Final    NO GROWTH 1 DAY Performed at [facility], [address], [place], [place] [address]    Report Status PENDING  Incomplete  Blood culture (routine x 2)     Status: Abnormal (Preliminary result)   Collection Time: 06/23/22  3:47  AM   Specimen: BLOOD LEFT HAND  Result Value Ref Range Status   Specimen Description   Final    BLOOD LEFT HAND Performed at [facility], [address] [place], [place] [address]    Special Requests   Final    BOTTLES DRAWN AEROBIC AND ANAEROBIC Blood Culture adequate volume Performed at [facility], [address] [place], [place] [address]    Culture  Setup Time   Final    Organism ID to follow IN BOTH AEROBIC AND ANAEROBIC BOTTLES GRAM NEGATIVE RODS CRITICAL RESULT CALLED TO, READ BACK BY AND VERIFIED WITH: JUSTIN MILLER 06/23/22 1529 KLW Performed at [facility], [address], [place], [place] [address]    Culture (A)  Final    ESCHERICHIA COLI SUSCEPTIBILITIES TO FOLLOW Performed at [facility], [place] [address], [place],  [address]    Report Status PENDING  Incomplete  Blood Culture ID Panel (Reflexed)     Status: Abnormal   Collection Time: 06/23/22  3:47 AM  Result Value Ref Range Status   Enterococcus faecalis NOT DETECTED NOT DETECTED Final   Enterococcus Faecium NOT DETECTED NOT DETECTED Final   Listeria monocytogenes NOT DETECTED NOT DETECTED Final   Staphylococcus species NOT DETECTED NOT DETECTED Final   Staphylococcus aureus (BCID) NOT DETECTED NOT DETECTED Final   Staphylococcus epidermidis NOT DETECTED NOT DETECTED Final   Staphylococcus lugdunensis NOT DETECTED NOT DETECTED Final   Streptococcus species NOT DETECTED NOT DETECTED Final   Streptococcus agalactiae NOT DETECTED NOT DETECTED Final   Streptococcus pneumoniae  NOT DETECTED NOT DETECTED Final   Streptococcus pyogenes NOT DETECTED NOT DETECTED Final   A.calcoaceticus-baumannii NOT DETECTED NOT DETECTED Final   Bacteroides fragilis NOT DETECTED NOT DETECTED Final   Enterobacterales DETECTED (A) NOT DETECTED Final    Comment: Enterobacterales represent a large order of gram negative bacteria, not a single organism. CRITICAL RESULT CALLED TO, READ BACK BY AND VERIFIED WITH: JUSTIN MILLER 06/23/22 1529 KLW    Enterobacter cloacae complex NOT DETECTED NOT DETECTED Final   Escherichia coli DETECTED (A) NOT DETECTED Final    Comment: CRITICAL RESULT CALLED TO, READ BACK BY AND VERIFIED WITH: JUSTIN MILLER 06/23/22 1529 KLW    Klebsiella aerogenes NOT DETECTED NOT DETECTED Final   Klebsiella oxytoca NOT DETECTED NOT DETECTED Final   Klebsiella pneumoniae NOT DETECTED NOT DETECTED Final   Proteus species NOT DETECTED NOT DETECTED Final   Salmonella species NOT DETECTED NOT DETECTED Final   Serratia marcescens NOT DETECTED NOT DETECTED Final   Haemophilus influenzae NOT DETECTED NOT DETECTED Final   Neisseria meningitidis NOT DETECTED NOT DETECTED Final   Pseudomonas aeruginosa NOT DETECTED NOT DETECTED Final   Stenotrophomonas maltophilia NOT DETECTED NOT DETECTED Final   Candida albicans NOT DETECTED NOT DETECTED Final   Candida auris NOT DETECTED NOT DETECTED Final   Candida glabrata NOT DETECTED NOT DETECTED Final   Candida krusei NOT DETECTED NOT DETECTED Final   Candida parapsilosis NOT DETECTED NOT DETECTED Final   Candida tropicalis NOT DETECTED NOT DETECTED Final   Cryptococcus neoformans/gattii NOT DETECTED NOT DETECTED Final   CTX-M ESBL NOT DETECTED NOT DETECTED Final   Carbapenem resistance IMP NOT DETECTED NOT DETECTED Final   Carbapenem resistance KPC NOT DETECTED NOT DETECTED Final   Carbapenem resistance NDM NOT DETECTED NOT DETECTED Final   Carbapenem resist OXA 48 LIKE NOT DETECTED NOT DETECTED Final   Carbapenem resistance VIM NOT  DETECTED NOT DETECTED Final  Comment: Performed at [facility], [address] [place], [place] [address]    Procedures and diagnostic studies:  MR ABDOMEN MRCP W WO CONTAST  Result Date: 06/23/2022 CLINICAL DATA:  Cholelithiasis. Right upper quadrant pain and nausea. EXAM: MRI ABDOMEN WITHOUT AND WITH CONTRAST (INCLUDING MRCP) TECHNIQUE: Multiplanar multisequence MR imaging of the abdomen was performed both before and after the administration of intravenous contrast. Heavily T2-weighted images of the biliary and pancreatic ducts were obtained, and three-dimensional MRCP images were rendered by post processing. CONTRAST:  31mL GADAVIST GADOBUTROL 1 MMOL/ML IV SOLN COMPARISON:  CT 06/23/2022 and abdominal sonogram 06/23/2022 FINDINGS: Lower chest: No acute findings. Hepatobiliary: Hepatic steatosis. No focal liver abnormality. There is sludge noted layering within the gallbladder fundus, image 24/4. The small stones noted on ultrasound are difficult to visualize and may be obscured by sludge. Diffuse gallbladder wall edema measures up to 5 mm. Mild pericholecystic fluid identified. The CBD measures up to 6 mm in diameter. No signs of choledocholithiasis. Pancreas: There is diffuse scratch set diffuse edema and enlargement involving the head of pancreas is identified with surrounding inflammatory fat stranding with fluid extending into the right retroperitoneum. No significant main duct dilatation. No discrete mass. No signs of pseudocyst formation. Spleen:  Within normal limits in size and appearance. Adrenals/Urinary Tract: No masses identified. No evidence of hydronephrosis. Stomach/Bowel: Stomach is normal. Duodenal diverticulum identified. No bowel wall thickening, inflammation or distension noted. Moderate retained stool noted within the visualized portions of the colon. Vascular/Lymphatic: The upper abdominal vascularity is patent. No aneurysm. No abdominal adenopathy. Other: Free fluid  extends from the region of the head of pancreas into the right retroperitoneum and along the right pericolic gutter. No focal fluid collections. Musculoskeletal: No suspicious bone lesions identified. IMPRESSION: 1. Imaging findings compatible with acute pancreatitis involving the head of pancreas. No signs of pseudocyst formation or necrosis. 2. Gallbladder sludge. The 3 mm stones noted on ultrasound are not confidently identified, but be obscured by sludge. There is diffuse gallbladder wall edema which in the setting of acute pancreatitis is nonspecific. 3. No signs of choledocholithiasis. 4. Hepatic steatosis. 5. Moderate retained stool within the visualized portions of the colon. Correlate for any clinical signs or symptoms of constipation. Electronically Signed   By: Kerby Moors M.D.   On: 06/23/2022 05:52   MR 3D Recon At Scanner  Result Date: 06/23/2022 CLINICAL DATA:  Cholelithiasis. Right upper quadrant pain and nausea. EXAM: MRI ABDOMEN WITHOUT AND WITH CONTRAST (INCLUDING MRCP) TECHNIQUE: Multiplanar multisequence MR imaging of the abdomen was performed both before and after the administration of intravenous contrast. Heavily T2-weighted images of the biliary and pancreatic ducts were obtained, and three-dimensional MRCP images were rendered by post processing. CONTRAST:  11mL GADAVIST GADOBUTROL 1 MMOL/ML IV SOLN COMPARISON:  CT 06/23/2022 and abdominal sonogram 06/23/2022 FINDINGS: Lower chest: No acute findings. Hepatobiliary: Hepatic steatosis. No focal liver abnormality. There is sludge noted layering within the gallbladder fundus, image 24/4. The small stones noted on ultrasound are difficult to visualize and may be obscured by sludge. Diffuse gallbladder wall edema measures up to 5 mm. Mild pericholecystic fluid identified. The CBD measures up to 6 mm in diameter. No signs of choledocholithiasis. Pancreas: There is diffuse scratch set diffuse edema and enlargement involving the head of  pancreas is identified with surrounding inflammatory fat stranding with fluid extending into the right retroperitoneum. No significant main duct dilatation. No discrete mass. No signs of pseudocyst formation. Spleen:  Within normal limits in  size and appearance. Adrenals/Urinary Tract: No masses identified. No evidence of hydronephrosis. Stomach/Bowel: Stomach is normal. Duodenal diverticulum identified. No bowel wall thickening, inflammation or distension noted. Moderate retained stool noted within the visualized portions of the colon. Vascular/Lymphatic: The upper abdominal vascularity is patent. No aneurysm. No abdominal adenopathy. Other: Free fluid extends from the region of the head of pancreas into the right retroperitoneum and along the right pericolic gutter. No focal fluid collections. Musculoskeletal: No suspicious bone lesions identified. IMPRESSION: 1. Imaging findings compatible with acute pancreatitis involving the head of pancreas. No signs of pseudocyst formation or necrosis. 2. Gallbladder sludge. The 3 mm stones noted on ultrasound are not confidently identified, but be obscured by sludge. There is diffuse gallbladder wall edema which in the setting of acute pancreatitis is nonspecific. 3. No signs of choledocholithiasis. 4. Hepatic steatosis. 5. Moderate retained stool within the visualized portions of the colon. Correlate for any clinical signs or symptoms of constipation. Electronically Signed   By: Kerby Moors M.D.   On: 06/23/2022 05:52   US ABDOMEN LIMITED RUQ (LIVER/GB)  Result Date: 06/23/2022 CLINICAL DATA:  Right upper quadrant pain EXAM: ULTRASOUND ABDOMEN LIMITED RIGHT UPPER QUADRANT COMPARISON:  None Available. FINDINGS: Gallbladder: There is gallbladder sludge and stones. Wall thickness measures 6 mm. Small amount of pericholecystic fluid. The sonographic Percell Miller sign could not be assessed because the patient had been medicated. Common bile duct: Diameter: 5 mm Liver: No focal  lesion identified. Within normal limits in parenchymal echogenicity. Portal vein is patent on color Doppler imaging with normal direction of blood flow towards the liver. Other: None. IMPRESSION: Gallbladder sludge and stones with gallbladder wall thickening and pericholecystic fluid. Findings are concerning for acute cholecystitis in the appropriate clinical setting. The sonographic Percell Miller sign could not be assessed because the patient had been medicated. Electronically Signed   By: Ulyses Jarred M.D.   On: 06/23/2022 02:49   CT ABDOMEN PELVIS W CONTRAST  Result Date: 06/23/2022 CLINICAL DATA:  pt presents ambulatory to triage via POV with complaints of epigastric pain that started 30 mins ago. Rates the pain 10/10 and its sharp in nature. No meds taken PTA. Hx of GERD. EXAM: CT ABDOMEN AND PELVIS WITH CONTRAST TECHNIQUE: Multidetector CT imaging of the abdomen and pelvis was performed using the standard protocol following bolus administration of intravenous contrast. RADIATION DOSE REDUCTION: This exam was performed according to the departmental dose-optimization program which includes automated exposure control, adjustment of the mA and/or kV according to patient size and/or use of iterative reconstruction technique. CONTRAST:  1108mL OMNIPAQUE IOHEXOL 300 MG/ML  SOLN COMPARISON:  CT abdomen pelvis 08/05/2002 report without imaging FINDINGS: Lower chest: Bilateral lower lobe subsegmental atelectasis. No acute abnormality. Hepatobiliary: No focal liver abnormality. Calcified gallstone noted within the gallbladder lumen. Question gallbladder wall thickening. Pericholecystic fluid. No biliary dilatation. No pseudocyst formation. Pancreas: Mild peripancreatic fat stranding and hazy pancreatic contour along the proximal pancreas. No main pancreatic ductal dilatation. Spleen: Normal in size without focal abnormality. Adrenals/Urinary Tract: No adrenal nodule bilaterally. Bilateral kidneys enhance symmetrically. No  hydronephrosis. No hydroureter. The urinary bladder is unremarkable. On delayed imaging, there is no urothelial wall thickening and there are no filling defects in the opacified portions of the bilateral collecting systems or ureters. Stomach/Bowel: Stomach is within normal limits. 2/3 portion of the duodenum diverticula. No evidence of bowel wall thickening or dilatation. Colonic diverticulosis. Appendix appears normal. Vascular/Lymphatic: No abdominal aorta or iliac aneurysm. Mild atherosclerotic plaque of the aorta and its  branches. No abdominal, pelvic, or inguinal lymphadenopathy. Reproductive: Prostate enlarged measuring up to 5.3 cm. Other: No intraperitoneal free fluid. No intraperitoneal free gas. No organized fluid collection. Musculoskeletal: No abdominal wall hernia or abnormality. No suspicious lytic or blastic osseous lesions. No acute displaced fracture. Mild retrolisthesis of L4 on L5. IMPRESSION: 1. Acute proximal pancreatitis.  No pseudocyst formation 2. Cholelithiasis with findings suggestive of acute cholecystitis. Recommend right upper quadrant ultrasound. 3. Colonic diverticulosis with no acute diverticulitis. 4. Prostatomegaly. 5. Aortic Atherosclerosis (ICD10-I70.0). Electronically Signed   By: Iven Finn M.D.   On: 06/23/2022 00:54               LOS: 1 day   Athleen Feltner  [facility]   Pager on [email or web address]. If 7PM-7AM, please contact night-coverage at [email or web address]     06/24/2022, 10:52 AM

## 2022-06-24 NOTE — Progress Notes (Signed)
CC: Also pancreatitis Subjective:  He is much better.  Tolerating liquids.  No fevers no chills.  MRCP personally reviewed showing evidence of pancreatitis. Objective: Vital signs in last 24 hours: Temp:  [98.1 F (36.7 C)] 98.1 F (36.7 C) (03/16 0725) Pulse Rate:  [54-61] 61 (03/16 0725) Resp:  [15-18] 16 (03/16 0725) BP: (116-129)/(69-74) 129/69 (03/16 0725) SpO2:  [99 %-100 %] 99 % (03/16 0725) Last BM Date : 06/21/22  Intake/Output from previous day: 03/15 0701 - 03/16 0700 In: 1472.3 [I.V.:1372.2; IV Piggyback:100.1] Out: -  Intake/Output this shift: No intake/output data recorded.  Physical exam: NAD alert Abd: soft, nt, no rebound or peritonitis, no masses  Lab Results: CBC  Recent Labs    06/23/22 0945 06/24/22 0400  WBC 12.0* 6.6  HGB 13.0 12.1*  HCT 39.0 35.9*  PLT 319 294   BMET Recent Labs    06/23/22 0700 06/24/22 0400  NA 140 137  K 3.6 3.6  CL 101 102  CO2 24 27  GLUCOSE 144* 112*  BUN 13 11  CREATININE 0.93 1.02  CALCIUM 9.3 9.0   PT/INR No results for input(s): "LABPROT", "INR" in the last 72 hours. ABG No results for input(s): "PHART", "HCO3" in the last 72 hours.  Invalid input(s): "PCO2", "PO2"  Studies/Results: MR ABDOMEN MRCP W WO CONTAST  Result Date: 06/23/2022 CLINICAL DATA:  Cholelithiasis. Right upper quadrant pain and nausea. EXAM: MRI ABDOMEN WITHOUT AND WITH CONTRAST (INCLUDING MRCP) TECHNIQUE: Multiplanar multisequence MR imaging of the abdomen was performed both before and after the administration of intravenous contrast. Heavily T2-weighted images of the biliary and pancreatic ducts were obtained, and three-dimensional MRCP images were rendered by post processing. CONTRAST:  72mL GADAVIST GADOBUTROL 1 MMOL/ML IV SOLN COMPARISON:  CT 06/23/2022 and abdominal sonogram 06/23/2022 FINDINGS: Lower chest: No acute findings. Hepatobiliary: Hepatic steatosis. No focal liver abnormality. There is sludge noted layering within the  gallbladder fundus, image 24/4. The small stones noted on ultrasound are difficult to visualize and may be obscured by sludge. Diffuse gallbladder wall edema measures up to 5 mm. Mild pericholecystic fluid identified. The CBD measures up to 6 mm in diameter. No signs of choledocholithiasis. Pancreas: There is diffuse scratch set diffuse edema and enlargement involving the head of pancreas is identified with surrounding inflammatory fat stranding with fluid extending into the right retroperitoneum. No significant main duct dilatation. No discrete mass. No signs of pseudocyst formation. Spleen:  Within normal limits in size and appearance. Adrenals/Urinary Tract: No masses identified. No evidence of hydronephrosis. Stomach/Bowel: Stomach is normal. Duodenal diverticulum identified. No bowel wall thickening, inflammation or distension noted. Moderate retained stool noted within the visualized portions of the colon. Vascular/Lymphatic: The upper abdominal vascularity is patent. No aneurysm. No abdominal adenopathy. Other: Free fluid extends from the region of the head of pancreas into the right retroperitoneum and along the right pericolic gutter. No focal fluid collections. Musculoskeletal: No suspicious bone lesions identified. IMPRESSION: 1. Imaging findings compatible with acute pancreatitis involving the head of pancreas. No signs of pseudocyst formation or necrosis. 2. Gallbladder sludge. The 3 mm stones noted on ultrasound are not confidently identified, but be obscured by sludge. There is diffuse gallbladder wall edema which in the setting of acute pancreatitis is nonspecific. 3. No signs of choledocholithiasis. 4. Hepatic steatosis. 5. Moderate retained stool within the visualized portions of the colon. Correlate for any clinical signs or symptoms of constipation. Electronically Signed   By: Kerby Moors M.D.   On: 06/23/2022 05:52  MR 3D Recon At Scanner  Result Date: 06/23/2022 CLINICAL DATA:   Cholelithiasis. Right upper quadrant pain and nausea. EXAM: MRI ABDOMEN WITHOUT AND WITH CONTRAST (INCLUDING MRCP) TECHNIQUE: Multiplanar multisequence MR imaging of the abdomen was performed both before and after the administration of intravenous contrast. Heavily T2-weighted images of the biliary and pancreatic ducts were obtained, and three-dimensional MRCP images were rendered by post processing. CONTRAST:  3mL GADAVIST GADOBUTROL 1 MMOL/ML IV SOLN COMPARISON:  CT 06/23/2022 and abdominal sonogram 06/23/2022 FINDINGS: Lower chest: No acute findings. Hepatobiliary: Hepatic steatosis. No focal liver abnormality. There is sludge noted layering within the gallbladder fundus, image 24/4. The small stones noted on ultrasound are difficult to visualize and may be obscured by sludge. Diffuse gallbladder wall edema measures up to 5 mm. Mild pericholecystic fluid identified. The CBD measures up to 6 mm in diameter. No signs of choledocholithiasis. Pancreas: There is diffuse scratch set diffuse edema and enlargement involving the head of pancreas is identified with surrounding inflammatory fat stranding with fluid extending into the right retroperitoneum. No significant main duct dilatation. No discrete mass. No signs of pseudocyst formation. Spleen:  Within normal limits in size and appearance. Adrenals/Urinary Tract: No masses identified. No evidence of hydronephrosis. Stomach/Bowel: Stomach is normal. Duodenal diverticulum identified. No bowel wall thickening, inflammation or distension noted. Moderate retained stool noted within the visualized portions of the colon. Vascular/Lymphatic: The upper abdominal vascularity is patent. No aneurysm. No abdominal adenopathy. Other: Free fluid extends from the region of the head of pancreas into the right retroperitoneum and along the right pericolic gutter. No focal fluid collections. Musculoskeletal: No suspicious bone lesions identified. IMPRESSION: 1. Imaging findings  compatible with acute pancreatitis involving the head of pancreas. No signs of pseudocyst formation or necrosis. 2. Gallbladder sludge. The 3 mm stones noted on ultrasound are not confidently identified, but be obscured by sludge. There is diffuse gallbladder wall edema which in the setting of acute pancreatitis is nonspecific. 3. No signs of choledocholithiasis. 4. Hepatic steatosis. 5. Moderate retained stool within the visualized portions of the colon. Correlate for any clinical signs or symptoms of constipation. Electronically Signed   By: Kerby Moors M.D.   On: 06/23/2022 05:52   US ABDOMEN LIMITED RUQ (LIVER/GB)  Result Date: 06/23/2022 CLINICAL DATA:  Right upper quadrant pain EXAM: ULTRASOUND ABDOMEN LIMITED RIGHT UPPER QUADRANT COMPARISON:  None Available. FINDINGS: Gallbladder: There is gallbladder sludge and stones. Wall thickness measures 6 mm. Small amount of pericholecystic fluid. The sonographic Percell Miller sign could not be assessed because the patient had been medicated. Common bile duct: Diameter: 5 mm Liver: No focal lesion identified. Within normal limits in parenchymal echogenicity. Portal vein is patent on color Doppler imaging with normal direction of blood flow towards the liver. Other: None. IMPRESSION: Gallbladder sludge and stones with gallbladder wall thickening and pericholecystic fluid. Findings are concerning for acute cholecystitis in the appropriate clinical setting. The sonographic Percell Miller sign could not be assessed because the patient had been medicated. Electronically Signed   By: Ulyses Jarred M.D.   On: 06/23/2022 02:49   CT ABDOMEN PELVIS W CONTRAST  Result Date: 06/23/2022 CLINICAL DATA:  pt presents ambulatory to triage via POV with complaints of epigastric pain that started 30 mins ago. Rates the pain 10/10 and its sharp in nature. No meds taken PTA. Hx of GERD. EXAM: CT ABDOMEN AND PELVIS WITH CONTRAST TECHNIQUE: Multidetector CT imaging of the abdomen and pelvis was  performed using the standard protocol following bolus administration of  intravenous contrast. RADIATION DOSE REDUCTION: This exam was performed according to the departmental dose-optimization program which includes automated exposure control, adjustment of the mA and/or kV according to patient size and/or use of iterative reconstruction technique. CONTRAST:  162mL OMNIPAQUE IOHEXOL 300 MG/ML  SOLN COMPARISON:  CT abdomen pelvis 08/05/2002 report without imaging FINDINGS: Lower chest: Bilateral lower lobe subsegmental atelectasis. No acute abnormality. Hepatobiliary: No focal liver abnormality. Calcified gallstone noted within the gallbladder lumen. Question gallbladder wall thickening. Pericholecystic fluid. No biliary dilatation. No pseudocyst formation. Pancreas: Mild peripancreatic fat stranding and hazy pancreatic contour along the proximal pancreas. No main pancreatic ductal dilatation. Spleen: Normal in size without focal abnormality. Adrenals/Urinary Tract: No adrenal nodule bilaterally. Bilateral kidneys enhance symmetrically. No hydronephrosis. No hydroureter. The urinary bladder is unremarkable. On delayed imaging, there is no urothelial wall thickening and there are no filling defects in the opacified portions of the bilateral collecting systems or ureters. Stomach/Bowel: Stomach is within normal limits. 2/3 portion of the duodenum diverticula. No evidence of bowel wall thickening or dilatation. Colonic diverticulosis. Appendix appears normal. Vascular/Lymphatic: No abdominal aorta or iliac aneurysm. Mild atherosclerotic plaque of the aorta and its branches. No abdominal, pelvic, or inguinal lymphadenopathy. Reproductive: Prostate enlarged measuring up to 5.3 cm. Other: No intraperitoneal free fluid. No intraperitoneal free gas. No organized fluid collection. Musculoskeletal: No abdominal wall hernia or abnormality. No suspicious lytic or blastic osseous lesions. No acute displaced fracture. Mild  retrolisthesis of L4 on L5. IMPRESSION: 1. Acute proximal pancreatitis.  No pseudocyst formation 2. Cholelithiasis with findings suggestive of acute cholecystitis. Recommend right upper quadrant ultrasound. 3. Colonic diverticulosis with no acute diverticulitis. 4. Prostatomegaly. 5. Aortic Atherosclerosis (ICD10-I70.0). Electronically Signed   By: Iven Finn M.D.   On: 06/23/2022 00:54    Anti-infectives: Anti-infectives (From admission, onward)    Start     Dose/Rate Route Frequency Ordered Stop   06/23/22 1000  piperacillin-tazobactam (ZOSYN) IVPB 3.375 g        3.375 g 12.5 mL/hr over 240 Minutes Intravenous Every 8 hours 06/23/22 0805     06/23/22 0315  piperacillin-tazobactam (ZOSYN) IVPB 3.375 g        3.375 g 100 mL/hr over 30 Minutes Intravenous  Once 06/23/22 0304 06/23/22 0424       Assessment/Plan: Gallstone pancreatitis with significant pancreatic inflammation.  Dr. Hampton Abbot to do his surgery in a couple weeks.  From my perspective may advance diet slowly.  Continues to improve.  Please note that I spent 35 minutes in this encounter including personally reviewing imaging studies, coordinating his care, placing orders and performing appropriate documentation  Caroleen Hamman, MD, FACS  06/24/2022

## 2022-06-24 NOTE — Plan of Care (Signed)

## 2022-06-25 LAB — CULTURE, BLOOD (ROUTINE X 2): Special Requests: ADEQUATE

## 2022-06-25 MED ORDER — LEVOFLOXACIN 500 MG PO TABS
500.0000 mg | ORAL_TABLET | Freq: Every day | ORAL | Status: DC
  Administered 2022-06-25: 500 mg via ORAL
  Filled 2022-06-25: qty 1

## 2022-06-25 MED ORDER — LEVOFLOXACIN 500 MG PO TABS: 500.0000 mg | ORAL_TABLET | Freq: Every day | ORAL | 0 refills | Status: DC

## 2022-06-25 NOTE — Discharge Summary (Addendum)
Physician Discharge Summary   Patient: Omar Phillips MRN: [id number] DOB: 1965-10-15  Admit date:     06/22/2022  Discharge date: 06/25/22  Discharge Physician: Jennye Boroughs   PCP: Carlena Hurl, PA-C   Recommendations at discharge:   Follow-up with Dr. Hampton Abbot, general surgeon, in 2 weeks Follow-up with PCP in 1 to 2 weeks  Discharge Diagnoses: Principal Problem:   Gallstone pancreatitis Active Problems:   Hypertension   Gastroesophageal reflux disease without esophagitis   Dyslipidemia   Pancreatitis   Acute cholecystitis   E coli bacteremia  Resolved Problems:   * No resolved hospital problems. *  Hospital Course:  Omar Phillips is a 57 y.o. male with medical history significant for anxiety, hyperlipidemia, hypertension, GERD, who presented to the hospital with abdominal pain and nausea.  He was recently started on Klonopin for anxiety.   Workup reviewed acute gallstone pancreatitis, acute cholecystitis.  He was treated with analgesics, IV fluids and empiric IV antibiotics.   Assessment and Plan:   Acute cholecystitis, E. coli bacteremia: Improved.  He is tolerating a regular diet.  He will be discharged on Levaquin to complete 7-day course of treatment.     Acute gallstone pancreatitis: Improved.  No evidence of choledocholithiasis on MRCP abdomen.       Other comorbidities include hypertension, anxiety, GERD  He feels better and he wants to be discharged home today.  Discharge plan discussed with the patient and he agrees with the plan.  Discharge plan discussed with Dr. Dahlia Byes, surgeon, who is agreeable with discharge.       Consultants: General surgery Procedures performed: None Disposition: Home Diet recommendation:  Discharge Diet Orders (From admission, onward)     Start     Ordered   06/25/22 0000  Diet - low sodium heart healthy        06/25/22 1044           Cardiac diet DISCHARGE MEDICATION: Allergies as of 06/25/2022        Reactions   Lipitor [atorvastatin]    Shakes, didn't feel good on it        Medication List     STOP taking these medications    predniSONE 10 MG (21) Tbpk tablet Commonly known as: STERAPRED UNI-PAK 21 TAB       TAKE these medications    clonazePAM 1 MG tablet Commonly known as: KLONOPIN Take 1 mg by mouth at bedtime as needed.   escitalopram 10 MG tablet Commonly known as: Lexapro Take 1 tablet (10 mg total) by mouth daily.   eszopiclone 2 MG Tabs tablet Commonly known as: Lunesta Take 1 tablet (2 mg total) by mouth at bedtime as needed for sleep. Take immediately before bedtime   FISH OIL PO Take by mouth.   levofloxacin 500 MG tablet Commonly known as: LEVAQUIN Take 1 tablet (500 mg total) by mouth daily for 4 days. Start taking on: June 26, 2022   multivitamin capsule Take 1 capsule by mouth daily.   sildenafil 50 MG tablet Commonly known as: VIAGRA TAKE 1 TABLET BY MOUTH AS NEEDED FOR ERECTILE DYSFUNCTION   valsartan 80 MG tablet Commonly known as: DIOVAN Take 1 tablet by mouth once daily        Follow-up Information     Piscoya, Jose, MD. Schedule an appointment as soon as possible for a visit in 2 week(s).   Specialty: General Surgery Why: Hospital follow up; cholecystitis Contact information: [address] [place]  [place] [address] [phone]                Discharge Exam: Danley Danker Weights   06/22/22 2158  Weight: 86.2 kg   GEN: NAD SKIN: No rash EYES: EOMI, no pallor or icterus ENT: MMM CV: RRR PULM: CTA B ABD: soft, ND, NT, +BS CNS: AAO x 3, non focal EXT: No edema or tenderness   Condition at discharge: good  The results of significant diagnostics from this hospitalization (including imaging, microbiology, ancillary and laboratory) are listed below for reference.   Imaging Studies: MR ABDOMEN MRCP W WO CONTAST  Result Date: 06/23/2022 CLINICAL DATA:  Cholelithiasis. Right upper quadrant  pain and nausea. EXAM: MRI ABDOMEN WITHOUT AND WITH CONTRAST (INCLUDING MRCP) TECHNIQUE: Multiplanar multisequence MR imaging of the abdomen was performed both before and after the administration of intravenous contrast. Heavily T2-weighted images of the biliary and pancreatic ducts were obtained, and three-dimensional MRCP images were rendered by post processing. CONTRAST:  6mL GADAVIST GADOBUTROL 1 MMOL/ML IV SOLN COMPARISON:  CT 06/23/2022 and abdominal sonogram 06/23/2022 FINDINGS: Lower chest: No acute findings. Hepatobiliary: Hepatic steatosis. No focal liver abnormality. There is sludge noted layering within the gallbladder fundus, image 24/4. The small stones noted on ultrasound are difficult to visualize and may be obscured by sludge. Diffuse gallbladder wall edema measures up to 5 mm. Mild pericholecystic fluid identified. The CBD measures up to 6 mm in diameter. No signs of choledocholithiasis. Pancreas: There is diffuse scratch set diffuse edema and enlargement involving the head of pancreas is identified with surrounding inflammatory fat stranding with fluid extending into the right retroperitoneum. No significant main duct dilatation. No discrete mass. No signs of pseudocyst formation. Spleen:  Within normal limits in size and appearance. Adrenals/Urinary Tract: No masses identified. No evidence of hydronephrosis. Stomach/Bowel: Stomach is normal. Duodenal diverticulum identified. No bowel wall thickening, inflammation or distension noted. Moderate retained stool noted within the visualized portions of the colon. Vascular/Lymphatic: The upper abdominal vascularity is patent. No aneurysm. No abdominal adenopathy. Other: Free fluid extends from the region of the head of pancreas into the right retroperitoneum and along the right pericolic gutter. No focal fluid collections. Musculoskeletal: No suspicious bone lesions identified. IMPRESSION: 1. Imaging findings compatible with acute pancreatitis involving  the head of pancreas. No signs of pseudocyst formation or necrosis. 2. Gallbladder sludge. The 3 mm stones noted on ultrasound are not confidently identified, but be obscured by sludge. There is diffuse gallbladder wall edema which in the setting of acute pancreatitis is nonspecific. 3. No signs of choledocholithiasis. 4. Hepatic steatosis. 5. Moderate retained stool within the visualized portions of the colon. Correlate for any clinical signs or symptoms of constipation. Electronically Signed   By: Kerby Moors M.D.   On: 06/23/2022 05:52   MR 3D Recon At Scanner  Result Date: 06/23/2022 CLINICAL DATA:  Cholelithiasis. Right upper quadrant pain and nausea. EXAM: MRI ABDOMEN WITHOUT AND WITH CONTRAST (INCLUDING MRCP) TECHNIQUE: Multiplanar multisequence MR imaging of the abdomen was performed both before and after the administration of intravenous contrast. Heavily T2-weighted images of the biliary and pancreatic ducts were obtained, and three-dimensional MRCP images were rendered by post processing. CONTRAST:  15mL GADAVIST GADOBUTROL 1 MMOL/ML IV SOLN COMPARISON:  CT 06/23/2022 and abdominal sonogram 06/23/2022 FINDINGS: Lower chest: No acute findings. Hepatobiliary: Hepatic steatosis. No focal liver abnormality. There is sludge noted layering within the gallbladder fundus, image 24/4. The small stones noted on ultrasound are difficult to visualize and may be obscured by  sludge. Diffuse gallbladder wall edema measures up to 5 mm. Mild pericholecystic fluid identified. The CBD measures up to 6 mm in diameter. No signs of choledocholithiasis. Pancreas: There is diffuse scratch set diffuse edema and enlargement involving the head of pancreas is identified with surrounding inflammatory fat stranding with fluid extending into the right retroperitoneum. No significant main duct dilatation. No discrete mass. No signs of pseudocyst formation. Spleen:  Within normal limits in size and appearance. Adrenals/Urinary  Tract: No masses identified. No evidence of hydronephrosis. Stomach/Bowel: Stomach is normal. Duodenal diverticulum identified. No bowel wall thickening, inflammation or distension noted. Moderate retained stool noted within the visualized portions of the colon. Vascular/Lymphatic: The upper abdominal vascularity is patent. No aneurysm. No abdominal adenopathy. Other: Free fluid extends from the region of the head of pancreas into the right retroperitoneum and along the right pericolic gutter. No focal fluid collections. Musculoskeletal: No suspicious bone lesions identified. IMPRESSION: 1. Imaging findings compatible with acute pancreatitis involving the head of pancreas. No signs of pseudocyst formation or necrosis. 2. Gallbladder sludge. The 3 mm stones noted on ultrasound are not confidently identified, but be obscured by sludge. There is diffuse gallbladder wall edema which in the setting of acute pancreatitis is nonspecific. 3. No signs of choledocholithiasis. 4. Hepatic steatosis. 5. Moderate retained stool within the visualized portions of the colon. Correlate for any clinical signs or symptoms of constipation. Electronically Signed   By: Kerby Moors M.D.   On: 06/23/2022 05:52   US ABDOMEN LIMITED RUQ (LIVER/GB)  Result Date: 06/23/2022 CLINICAL DATA:  Right upper quadrant pain EXAM: ULTRASOUND ABDOMEN LIMITED RIGHT UPPER QUADRANT COMPARISON:  None Available. FINDINGS: Gallbladder: There is gallbladder sludge and stones. Wall thickness measures 6 mm. Small amount of pericholecystic fluid. The sonographic Percell Miller sign could not be assessed because the patient had been medicated. Common bile duct: Diameter: 5 mm Liver: No focal lesion identified. Within normal limits in parenchymal echogenicity. Portal vein is patent on color Doppler imaging with normal direction of blood flow towards the liver. Other: None. IMPRESSION: Gallbladder sludge and stones with gallbladder wall thickening and pericholecystic  fluid. Findings are concerning for acute cholecystitis in the appropriate clinical setting. The sonographic Percell Miller sign could not be assessed because the patient had been medicated. Electronically Signed   By: Ulyses Jarred M.D.   On: 06/23/2022 02:49   CT ABDOMEN PELVIS W CONTRAST  Result Date: 06/23/2022 CLINICAL DATA:  pt presents ambulatory to triage via POV with complaints of epigastric pain that started 30 mins ago. Rates the pain 10/10 and its sharp in nature. No meds taken PTA. Hx of GERD. EXAM: CT ABDOMEN AND PELVIS WITH CONTRAST TECHNIQUE: Multidetector CT imaging of the abdomen and pelvis was performed using the standard protocol following bolus administration of intravenous contrast. RADIATION DOSE REDUCTION: This exam was performed according to the departmental dose-optimization program which includes automated exposure control, adjustment of the mA and/or kV according to patient size and/or use of iterative reconstruction technique. CONTRAST:  168mL OMNIPAQUE IOHEXOL 300 MG/ML  SOLN COMPARISON:  CT abdomen pelvis 08/05/2002 report without imaging FINDINGS: Lower chest: Bilateral lower lobe subsegmental atelectasis. No acute abnormality. Hepatobiliary: No focal liver abnormality. Calcified gallstone noted within the gallbladder lumen. Question gallbladder wall thickening. Pericholecystic fluid. No biliary dilatation. No pseudocyst formation. Pancreas: Mild peripancreatic fat stranding and hazy pancreatic contour along the proximal pancreas. No main pancreatic ductal dilatation. Spleen: Normal in size without focal abnormality. Adrenals/Urinary Tract: No adrenal nodule bilaterally. Bilateral kidneys enhance symmetrically.  No hydronephrosis. No hydroureter. The urinary bladder is unremarkable. On delayed imaging, there is no urothelial wall thickening and there are no filling defects in the opacified portions of the bilateral collecting systems or ureters. Stomach/Bowel: Stomach is within normal  limits. 2/3 portion of the duodenum diverticula. No evidence of bowel wall thickening or dilatation. Colonic diverticulosis. Appendix appears normal. Vascular/Lymphatic: No abdominal aorta or iliac aneurysm. Mild atherosclerotic plaque of the aorta and its branches. No abdominal, pelvic, or inguinal lymphadenopathy. Reproductive: Prostate enlarged measuring up to 5.3 cm. Other: No intraperitoneal free fluid. No intraperitoneal free gas. No organized fluid collection. Musculoskeletal: No abdominal wall hernia or abnormality. No suspicious lytic or blastic osseous lesions. No acute displaced fracture. Mild retrolisthesis of L4 on L5. IMPRESSION: 1. Acute proximal pancreatitis.  No pseudocyst formation 2. Cholelithiasis with findings suggestive of acute cholecystitis. Recommend right upper quadrant ultrasound. 3. Colonic diverticulosis with no acute diverticulitis. 4. Prostatomegaly. 5. Aortic Atherosclerosis (ICD10-I70.0). Electronically Signed   By: Iven Finn M.D.   On: 06/23/2022 00:54    Microbiology: Results for orders placed or performed during the hospital encounter of 06/22/22  Blood culture (routine x 2)     Status: None (Preliminary result)   Collection Time: 06/23/22  3:47 AM   Specimen: BLOOD  Result Value Ref Range Status   Specimen Description BLOOD BLOOD RIGHT HAND  Final   Special Requests   Final    BOTTLES DRAWN AEROBIC AND ANAEROBIC Blood Culture adequate volume   Culture   Final    NO GROWTH 2 DAYS Performed at [facility], [address] [place], [place] [address]    Report Status PENDING  Incomplete  Blood culture (routine x 2)     Status: Abnormal (Preliminary result)   Collection Time: 06/23/22  3:47 AM   Specimen: BLOOD LEFT HAND  Result Value Ref Range Status   Specimen Description   Final    BLOOD LEFT HAND Performed at [facility], [place] [address] [place], [place] [address]    Special Requests   Final    BOTTLES DRAWN AEROBIC AND ANAEROBIC  Blood Culture adequate volume Performed at [facility], [address] [place], [place] [address]    Culture  Setup Time   Final    Organism ID to follow IN BOTH AEROBIC AND ANAEROBIC BOTTLES GRAM NEGATIVE RODS CRITICAL RESULT CALLED TO, READ BACK BY AND VERIFIED WITH: JUSTIN MILLER 06/23/22 1529 KLW Performed at [facility], [place], [place] [address]    Culture ESCHERICHIA COLI (A)  Final   Report Status PENDING  Incomplete   Organism ID, Bacteria ESCHERICHIA COLI  Final      Susceptibility   Escherichia coli - MIC*    AMPICILLIN <=2 SENSITIVE Sensitive     CEFEPIME <=0.12 SENSITIVE Sensitive     CEFTAZIDIME <=1 SENSITIVE Sensitive     CEFTRIAXONE <=0.25 SENSITIVE Sensitive     CIPROFLOXACIN <=0.25 SENSITIVE Sensitive     GENTAMICIN <=1 SENSITIVE Sensitive     IMIPENEM <=0.25 SENSITIVE Sensitive     TRIMETH/SULFA <=20 SENSITIVE Sensitive     AMPICILLIN/SULBACTAM <=2 SENSITIVE Sensitive     PIP/TAZO <=4 SENSITIVE Sensitive     * ESCHERICHIA COLI  Blood Culture ID Panel (Reflexed)     Status: Abnormal   Collection Time: 06/23/22  3:47 AM  Result Value Ref Range Status   Enterococcus faecalis NOT DETECTED NOT DETECTED Final   Enterococcus Faecium NOT DETECTED NOT DETECTED Final  Listeria monocytogenes NOT DETECTED NOT DETECTED Final   Staphylococcus species NOT DETECTED NOT DETECTED Final   Staphylococcus aureus (BCID) NOT DETECTED NOT DETECTED Final   Staphylococcus epidermidis NOT DETECTED NOT DETECTED Final   Staphylococcus lugdunensis NOT DETECTED NOT DETECTED Final   Streptococcus species NOT DETECTED NOT DETECTED Final   Streptococcus agalactiae NOT DETECTED NOT DETECTED Final   Streptococcus pneumoniae NOT DETECTED NOT DETECTED Final   Streptococcus pyogenes NOT DETECTED NOT DETECTED Final   A.calcoaceticus-baumannii NOT DETECTED NOT DETECTED Final   Bacteroides fragilis NOT DETECTED NOT DETECTED Final   Enterobacterales DETECTED  (A) NOT DETECTED Final    Comment: Enterobacterales represent a large order of gram negative bacteria, not a single organism. CRITICAL RESULT CALLED TO, READ BACK BY AND VERIFIED WITH: JUSTIN MILLER 06/23/22 1529 KLW    Enterobacter cloacae complex NOT DETECTED NOT DETECTED Final   Escherichia coli DETECTED (A) NOT DETECTED Final    Comment: CRITICAL RESULT CALLED TO, READ BACK BY AND VERIFIED WITH: JUSTIN MILLER 06/23/22 1529 KLW    Klebsiella aerogenes NOT DETECTED NOT DETECTED Final   Klebsiella oxytoca NOT DETECTED NOT DETECTED Final   Klebsiella pneumoniae NOT DETECTED NOT DETECTED Final   Proteus species NOT DETECTED NOT DETECTED Final   Salmonella species NOT DETECTED NOT DETECTED Final   Serratia marcescens NOT DETECTED NOT DETECTED Final   Haemophilus influenzae NOT DETECTED NOT DETECTED Final   Neisseria meningitidis NOT DETECTED NOT DETECTED Final   Pseudomonas aeruginosa NOT DETECTED NOT DETECTED Final   Stenotrophomonas maltophilia NOT DETECTED NOT DETECTED Final   Candida albicans NOT DETECTED NOT DETECTED Final   Candida auris NOT DETECTED NOT DETECTED Final   Candida glabrata NOT DETECTED NOT DETECTED Final   Candida krusei NOT DETECTED NOT DETECTED Final   Candida parapsilosis NOT DETECTED NOT DETECTED Final   Candida tropicalis NOT DETECTED NOT DETECTED Final   Cryptococcus neoformans/gattii NOT DETECTED NOT DETECTED Final   CTX-M ESBL NOT DETECTED NOT DETECTED Final   Carbapenem resistance IMP NOT DETECTED NOT DETECTED Final   Carbapenem resistance KPC NOT DETECTED NOT DETECTED Final   Carbapenem resistance NDM NOT DETECTED NOT DETECTED Final   Carbapenem resist OXA 48 LIKE NOT DETECTED NOT DETECTED Final   Carbapenem resistance VIM NOT DETECTED NOT DETECTED Final    Comment: Performed at [facility], [address] [place], [place] [address]    Labs: CBC: Recent Labs  Lab 06/22/22 2206 06/23/22 0627 06/23/22 0945 06/24/22 0400  WBC 6.6 12.2*  12.0* 6.6  HGB 13.0 14.1 13.0 12.1*  HCT 39.0 44.3 39.0 35.9*  MCV 93.3 98.2 92.4 93.0  PLT 338 227 319 [id number]   Basic Metabolic Panel: Recent Labs  Lab 06/22/22 2206 06/23/22 0700 06/24/22 0400  NA 136 140 137  K 4.0 3.6 3.6  CL 99 101 102  CO2 29 24 27   GLUCOSE 173* 144* 112*  BUN 18 13 11   CREATININE 1.07 0.93 1.02  CALCIUM 9.0 9.3 9.0  MG  --   --  1.9   Liver Function Tests: Recent Labs  Lab 06/22/22 2206 06/23/22 0700 06/24/22 0400  AST 97* 130* 39  ALT 54* 153* 92*  ALKPHOS 61 72 62  BILITOT 0.6 0.8 1.4*  PROT 7.2 7.0 6.4*  ALBUMIN 4.1 3.9 3.5   CBG: No results for input(s): "GLUCAP" in the last 168 hours.  Discharge time spent: greater than 30 minutes.  Signed: Jennye Boroughs, MD [facility] 06/25/2022

## 2022-06-25 NOTE — Progress Notes (Signed)
CC: Pancreattiis Subjective: Had low fat meal, no N/V, no abd pain ambulated  Objective: Vital signs in last 24 hours: Temp:  [97.7 F (36.5 C)-98.2 F (36.8 C)] 97.7 F (36.5 C) (03/17 0849) Pulse Rate:  [58-64] 58 (03/17 0849) Resp:  [18] 18 (03/17 0849) BP: (139-141)/(80-88) 141/88 (03/17 0849) SpO2:  [100 %] 100 % (03/17 0849) Last BM Date : 06/25/22  Intake/Output from previous day: 03/16 0701 - 03/17 0700 In: -  Out: 550 [Urine:550] Intake/Output this shift: Total I/O In: -  Out: 100 [Urine:100]  Physical exam:  NAD alert Abd: soft, nt, no rebound or peritonitis, no masses  Lab Results: CBC  Recent Labs    06/23/22 0945 06/24/22 0400  WBC 12.0* 6.6  HGB 13.0 12.1*  HCT 39.0 35.9*  PLT 319 294   BMET Recent Labs    06/23/22 0700 06/24/22 0400  NA 140 137  K 3.6 3.6  CL 101 102  CO2 24 27  GLUCOSE 144* 112*  BUN 13 11  CREATININE 0.93 1.02  CALCIUM 9.3 9.0   PT/INR No results for input(s): "LABPROT", "INR" in the last 72 hours. ABG No results for input(s): "PHART", "HCO3" in the last 72 hours.  Invalid input(s): "PCO2", "PO2"  Studies/Results: No results found.  Anti-infectives: Anti-infectives (From admission, onward)    Start     Dose/Rate Route Frequency Ordered Stop   06/26/22 0000  levofloxacin (LEVAQUIN) 500 MG tablet        500 mg Oral Daily 06/25/22 1044 06/30/22 2359   06/25/22 1030  levofloxacin (LEVAQUIN) tablet 500 mg        500 mg Oral Daily 06/25/22 0934 06/30/22 0959   06/23/22 1000  piperacillin-tazobactam (ZOSYN) IVPB 3.375 g  Status:  Discontinued        3.375 g 12.5 mL/hr over 240 Minutes Intravenous Every 8 hours 06/23/22 0805 06/25/22 0934   06/23/22 0315  piperacillin-tazobactam (ZOSYN) IVPB 3.375 g        3.375 g 100 mL/hr over 30 Minutes Intravenous  Once 06/23/22 0304 06/23/22 0424       Assessment/Plan:  Gallstone pancreatitis Please note that I spent 35 minutes in this encounter including personally  reviewing imaging studies, coordinating his care, placing orders and performing appropriate documentation   Caroleen Hamman, MD, FACS  06/25/2022

## 2022-06-25 NOTE — Plan of Care (Signed)

## 2022-06-26 ENCOUNTER — Telehealth: Payer: Self-pay | Admitting: Internal Medicine

## 2022-06-26 ENCOUNTER — Telehealth: Payer: Self-pay

## 2022-06-26 NOTE — Telephone Encounter (Signed)
[facility] called and pt had his 1st appt on 06/21/22 and has 2nd one scheduled 07/05/22  Phone number [phone]

## 2022-06-26 NOTE — Transitions of Care (Post Inpatient/ED Visit) (Signed)
   06/26/2022  Name: Omar Phillips MRN: [id number] DOB: 11/23/1965  Today's TOC FU Call Status: Today's TOC FU Call Status:: Successful TOC FU Call Competed TOC FU Call Complete Date: 06/26/22  Transition Care Management Follow-up Telephone Call Date of Discharge: 06/25/22 Discharge Facility: [facility]) Type of Discharge: Inpatient Admission Primary Inpatient Discharge Diagnosis:: Gallstone pancreatitis How have you been since you were released from the hospital?: Better Any questions or concerns?: No  Items Reviewed: Did you receive and understand the discharge instructions provided?: Yes Medications obtained and verified?: Yes (Medications Reviewed) Any new allergies since your discharge?: No Dietary orders reviewed?: Yes Do you have support at home?: Yes People in Home: significant other  Home Care and Equipment/Supplies: [place] Ordered?: No Any new equipment or medical supplies ordered?: No  Functional Questionnaire: Do you need assistance with bathing/showering or dressing?: No Do you need assistance with meal preparation?: No Do you need assistance with eating?: No Do you have difficulty maintaining continence: No Do you need assistance with getting out of bed/getting out of a chair/moving?: No Do you have difficulty managing or taking your medications?: No  Folllow up appointments reviewed: PCP Follow-up appointment confirmed?: Yes Date of PCP follow-up appointment?: 06/29/22 Follow-up Provider: Chana Bode [facility] Follow-up appointment confirmed?: Yes Date of Specialist follow-up appointment?: 07/10/22 Follow-Up Specialty Provider:: Dr Hampton Abbot Do you need transportation to your follow-up appointment?: No Do you understand care options if your condition(s) worsen?: Yes-patient verbalized understanding    [place] LPN [place] [facility] [phone]

## 2022-06-28 LAB — CULTURE, BLOOD (ROUTINE X 2)
Culture: NO GROWTH
Special Requests: ADEQUATE

## 2022-06-29 ENCOUNTER — Ambulatory Visit: Payer: 59 | Admitting: Medical

## 2022-06-29 VITALS — BP 120/70 | HR 71 | Wt 181.8 lb

## 2022-06-29 DIAGNOSIS — F419 Anxiety disorder, unspecified: Secondary | ICD-10-CM

## 2022-06-29 DIAGNOSIS — N400 Enlarged prostate without lower urinary tract symptoms: Secondary | ICD-10-CM

## 2022-06-29 DIAGNOSIS — I10 Essential (primary) hypertension: Secondary | ICD-10-CM | POA: Diagnosis not present

## 2022-06-29 DIAGNOSIS — K851 Biliary acute pancreatitis without necrosis or infection: Secondary | ICD-10-CM | POA: Diagnosis not present

## 2022-06-29 DIAGNOSIS — K810 Acute cholecystitis: Secondary | ICD-10-CM

## 2022-06-29 DIAGNOSIS — E785 Hyperlipidemia, unspecified: Secondary | ICD-10-CM

## 2022-06-29 DIAGNOSIS — G894 Chronic pain syndrome: Secondary | ICD-10-CM

## 2022-06-29 DIAGNOSIS — M21372 Foot drop, left foot: Secondary | ICD-10-CM

## 2022-06-29 MED ORDER — POLYETHYLENE GLYCOL 3350 17 GM/SCOOP PO POWD: 17.0000 g | Freq: Every day | ORAL | 0 refills | Status: DC

## 2022-06-29 MED ORDER — ROSUVASTATIN CALCIUM 10 MG PO TABS: 10.0000 mg | ORAL_TABLET | Freq: Every day | ORAL | 3 refills | Status: DC

## 2022-06-29 NOTE — Progress Notes (Signed)
Subjective:  Omar Phillips is a 57 y.o. male who presents for Chief Complaint  Patient presents with   Hospitalization Follow-up    Hospital follow-up- gallstones, and pancreatitis doing well and not having any pain. Took last antibiotic today     Here for hospital f/u  Hospitalized 06/22/22 - 06/25/22  Discharge Diagnoses: Principal Problem:   Gallstone pancreatitis Active Problems:   Hypertension   Gastroesophageal reflux disease without esophagitis   Dyslipidemia   Pancreatitis   Acute cholecystitis   E coli bacteremia  Went to ED with abdominal pain, nausea, had scans, labs.  Was found to have gallstone pancreatitis.   They were considering surgery but as he improved with clear fluids, they advised surgery consult after discharge.  Discharged with antibiotics.  Much improved. Finished antibiotic today.  Will plan to f/u with surgery 07/10/22.    Went back to orthopedics recently.  Had some foot drop issues. Is suppose to be getting splint for left foot and leg.   Awaiting this.  Still seeing PT and chiropractor and massage.  Just recently got established with psychiatry, had good experience with psych.  Sees psych again next week.  No other aggravating or relieving factors.    No other c/o.  Past Medical History:  Diagnosis Date   Anxiety    Chest pain 2006   hospital eval   Dyslipidemia    GERD (gastroesophageal reflux disease)    History of cardiovascular stress test 2006   [facility]   Hypertension    Current Outpatient Medications on File Prior to Visit  Medication Sig Dispense Refill   escitalopram (LEXAPRO) 10 MG tablet Take 1 tablet (10 mg total) by mouth daily. 30 tablet 0   eszopiclone (LUNESTA) 2 MG TABS tablet Take 1 tablet (2 mg total) by mouth at bedtime as needed for sleep. Take immediately before bedtime 30 tablet 1   Multiple Vitamin (MULTIVITAMIN) capsule Take 1 capsule by mouth daily.     valsartan (DIOVAN) 80 MG tablet Take 1 tablet by  mouth once daily 90 tablet 0   clonazePAM (KLONOPIN) 1 MG tablet Take 1 mg by mouth at bedtime as needed. (Patient not taking: Reported on 06/29/2022)     Omega-3 Fatty Acids (FISH OIL PO) Take by mouth.     sildenafil (VIAGRA) 50 MG tablet TAKE 1 TABLET BY MOUTH AS NEEDED FOR ERECTILE DYSFUNCTION 10 tablet 0   No current facility-administered medications on file prior to visit.   Past Surgical History:  Procedure Laterality Date   LACERATION REPAIR     left forearm, remote past   TESTICLE TORSION REDUCTION     teenage years     The following portions of the patient's history were reviewed and updated as appropriate: allergies, current medications, past family history, past medical history, past social history, past surgical history and problem list.  ROS Otherwise as in subjective above  Objective: BP 120/70   Pulse 71   Wt 181 lb 12.8 oz (82.5 kg)   BMI 24.66 kg/m   General appearance: alert, no distress, well developed, well nourished Abdomen: +bs, soft, non tender, non distended, no masses, no hepatomegaly, no splenomegaly Pulses: 2+ radial pulses, 2+ pedal pulses, normal cap refill Ext: no edema    Assessment: Encounter Diagnoses  Name Primary?   Gallstone pancreatitis Yes   Acute biliary pancreatitis, unspecified complication status    Acute cholecystitis    Primary hypertension    Chronic pain syndrome    Enlarged prostate  Hyperlipidemia, unspecified hyperlipidemia type    Left foot drop    Anxiety      Plan: Recent cholelithiasis, gall stone pancreatitis  I reviewed hospitalizing notes, imaging, labs. Medications reconciled    Discharged on 7 days of levaquin, He finished levaquin this morning Follow up as planned with general surgery 07/10/22  Hepatic steatosis I recommend low fat diet in general  Lots of stool on scan 07/03/22 Still having some constipation Advised miralax OTC daily as needed    Aortic atherosclerosis -  Reviewed  findings Prior did not tolerate lipitor After 2-3 weeks if feeling better, then begin trial of Crestor   prostate enlargement on recent scan  Defer PSA to next well visit  Chronic back pain, foot drop Awaiting splint, follow up with ortho, PT, chiropractor, massage  Anxiety - f/u with psychiatry, recently established care    Omar Phillips was seen today for hospitalization follow-up.  Diagnoses and all orders for this visit:  Gallstone pancreatitis  Acute biliary pancreatitis, unspecified complication status  Acute cholecystitis  Primary hypertension  Chronic pain syndrome  Enlarged prostate  Hyperlipidemia, unspecified hyperlipidemia type  Left foot drop  Anxiety  Other orders -     rosuvastatin (CRESTOR) 10 MG tablet; Take 1 tablet (10 mg total) by mouth daily. -     polyethylene glycol powder (GLYCOLAX/MIRALAX) 17 GM/SCOOP powder; Take 17 g by mouth daily.     Follow up: with general surgery, f/u with therapy and chiropractor for ongoing back issues

## 2022-07-10 ENCOUNTER — Ambulatory Visit: Payer: 59 | Admitting: Surgery

## 2022-07-10 ENCOUNTER — Encounter: Payer: Self-pay | Admitting: Surgery

## 2022-07-10 VITALS — BP 152/89 | HR 58 | Temp 98.0°F | Ht 72.0 in | Wt 186.2 lb

## 2022-07-10 DIAGNOSIS — K851 Biliary acute pancreatitis without necrosis or infection: Secondary | ICD-10-CM | POA: Diagnosis not present

## 2022-07-10 NOTE — Progress Notes (Signed)
07/10/2022  History of Present Illness: Omar Phillips is a 57 y.o. male presenting for follow-up from gallstone pancreatitis.  The patient was admitted on 06/23/2022 with gallstone pancreatitis.  Initially had a CT scan of the abdomen and pelvis which showed acute pancreatitis with cholelithiasis.  Ultrasound was done afterwards showing gallbladder sludge and stones with gallbladder wall thickening and pericholecystic fluid.  MRCP was obtained also which did not show pancreatitis with edema and enlargement involving the head of the pancreas with surrounding inflammatory stranding with fluid extending into the right retroperitoneum.  He was treated conservatively with bowel rest and medical management and eventually was able to get discharged home on 06/25/2022.  The patient reports that he has been doing well since discharge and denies any further abdominal pain.  Denies any nausea, or vomiting, constipation, or diarrhea.  He has been keeping a low-fat diet.  Past Medical History: Past Medical History:  Diagnosis Date   Anxiety    Chest pain 2006   hospital eval   Dyslipidemia    GERD (gastroesophageal reflux disease)    History of cardiovascular stress test 2006   [facility]   Hypertension      Past Surgical History: Past Surgical History:  Procedure Laterality Date   LACERATION REPAIR     left forearm, remote past   TESTICLE TORSION REDUCTION     teenage years    Home Medications: Prior to Admission medications   Medication Sig Start Date End Date Taking? Authorizing Provider  escitalopram (LEXAPRO) 10 MG tablet Take 1 tablet (10 mg total) by mouth daily. 05/10/22  Yes Mesner, Corene Cornea, MD  eszopiclone (LUNESTA) 2 MG TABS tablet Take 1 tablet (2 mg total) by mouth at bedtime as needed for sleep. Take immediately before bedtime 06/17/22  Yes Tysinger, Camelia Eng, PA-C  Multiple Vitamin (MULTIVITAMIN) capsule Take 1 capsule by mouth daily.   Yes [provider]  Omega-3 Fatty  Acids (FISH OIL PO) Take by mouth.   Yes [provider]  polyethylene glycol powder (GLYCOLAX/MIRALAX) 17 GM/SCOOP powder Take 17 g by mouth daily. 06/29/22  Yes Tysinger, Camelia Eng, PA-C  rosuvastatin (CRESTOR) 10 MG tablet Take 1 tablet (10 mg total) by mouth daily. 06/29/22  Yes Tysinger, Camelia Eng, PA-C  sildenafil (VIAGRA) 50 MG tablet TAKE 1 TABLET BY MOUTH AS NEEDED FOR ERECTILE DYSFUNCTION 11/22/21  Yes Tysinger, Camelia Eng, PA-C  valsartan (DIOVAN) 80 MG tablet Take 1 tablet by mouth once daily 03/10/22  Yes Tysinger, Camelia Eng, PA-C    Allergies: Allergies  Allergen Reactions   Lipitor [Atorvastatin]     Shakes, didn't feel good on it    Review of Systems: Review of Systems  Constitutional:  Negative for chills and fever.  HENT:  Negative for hearing loss.   Respiratory:  Negative for shortness of breath.   Cardiovascular:  Negative for chest pain.  Gastrointestinal:  Negative for abdominal pain, nausea and vomiting.  Genitourinary:  Negative for dysuria.  Musculoskeletal:  Positive for back pain.  Skin:  Negative for rash.  Neurological:  Negative for dizziness.  Psychiatric/Behavioral:  Negative for depression.     Physical Exam BP (!) 152/89   Pulse (!) 58   Temp 98 F (36.7 C) (Oral)   Ht 6' (1.829 m)   Wt 186 lb 3.2 oz (84.5 kg)   SpO2 98%   BMI 25.25 kg/m  CONSTITUTIONAL: No acute distress, well-nourished HEENT:  Normocephalic, atraumatic, extraocular motion intact. NECK: Trachea is midline, no jugular  venous distention. RESPIRATORY:  Lungs are clear, and breath sounds are equal bilaterally. Normal respiratory effort without pathologic use of accessory muscles. CARDIOVASCULAR: Heart is regular without murmurs, gallops, or rubs. GI: The abdomen is soft, nondistended, nontender to palpation.  Negative Murphy sign.  MUSCULOSKELETAL: Normal gait, no peripheral edema. NEUROLOGIC:  Motor and sensation is grossly normal.  Cranial nerves are grossly intact. PSYCH:   Alert and oriented to person, place and time. Affect is normal.  Labs/Imaging: CT abdomen/pelvis on 06/23/2022: IMPRESSION: 1. Acute proximal pancreatitis.  No pseudocyst formation 2. Cholelithiasis with findings suggestive of acute cholecystitis. Recommend right upper quadrant ultrasound. 3. Colonic diverticulosis with no acute diverticulitis. 4. Prostatomegaly. 5. Aortic Atherosclerosis (ICD10-I70.0).  Ultrasound RUQ on 06/23/2022: IMPRESSION: Gallbladder sludge and stones with gallbladder wall thickening and pericholecystic fluid. Findings are concerning for acute cholecystitis in the appropriate clinical setting. The sonographic Percell Miller sign could not be assessed because the patient had been medicated.  MRCP on 06/23/2022: IMPRESSION: 1. Imaging findings compatible with acute pancreatitis involving the head of pancreas. No signs of pseudocyst formation or necrosis. 2. Gallbladder sludge. The 3 mm stones noted on ultrasound are not confidently identified, but be obscured by sludge. There is diffuse gallbladder wall edema which in the setting of acute pancreatitis is nonspecific. 3. No signs of choledocholithiasis. 4. Hepatic steatosis. 5. Moderate retained stool within the visualized portions of the colon. Correlate for any clinical signs or symptoms of constipation.  Assessment and Plan: This is a 57 y.o. male with a history of gallstone pancreatitis.  - Discussed with the patient the findings on his imaging studies and labs on his initial admission.  Discussed with him that given the significant inflammatory changes from his pancreatitis, it was recommended to delay surgery and allow the inflammatory changes to resolve and improve.  He has been doing very well since his discharge and has not had other flareups.  Discussed with him the waiting time is about 6 to 8 weeks to allow for the inflammation to subside.  Discussed with him the role for surgical management rather than  medical management and he is in agreement with surgery. - Discussed with him the plan for robotic assisted cholecystectomy and reviewed the surgery at length with him including the planned incisions, risks of bleeding, infection, injury to surrounding structures, that this would be an outpatient procedure, the use of ICG for better evaluation of the biliary anatomy, postoperative activity restrictions, pain control, and he is willing to proceed. - We will schedule surgery for 09/07/2022.  All of his questions have been answered.  He will follow-up next month for H&P update.  I spent 40 minutes dedicated to the care of this patient on the date of this encounter to include pre-visit review of records, face-to-face time with the patient discussing diagnosis and management, and any post-visit coordination of care.   Melvyn Neth, [facility]

## 2022-07-10 NOTE — Patient Instructions (Addendum)
Our surgery scheduler Pamala Hurry will call you within 24-48 hours to get you scheduled. If you have not heard from her after 48 hours, please call our office. Have the blue sheet available when she calls to write down important information.   If you have any concerns or questions, please feel free to call our office. See follow up appointment below.   Minimally Invasive Cholecystectomy Minimally invasive cholecystectomy is surgery to remove the gallbladder. The gallbladder is a pear-shaped organ that lies beneath the liver on the right side of the body. The gallbladder stores bile, which is a fluid that helps the body digest fats. Cholecystectomy is often done to treat inflammation (irritation and swelling) of the gallbladder (cholecystitis). This condition is usually caused by a buildup of gallstones (cholelithiasis) in the gallbladder or when the fluid in the gall bladder becomes stagnant because gallstones get stuck in the ducts (tubes) and block the flow of bile. This can result in inflammation and pain. In severe cases, emergency surgery may be required. This procedure is done through small incisions in the abdomen, instead of one large incision. It is also called laparoscopic surgery. A thin scope with a camera (laparoscope) is inserted through one incision. Then surgical instruments are inserted through the other incisions. In some cases, a minimally invasive surgery may need to be changed to a surgery that is done through a larger incision. This is called open surgery. Tell a health care provider about: Any allergies you have. All medicines you are taking, including vitamins, herbs, eye drops, creams, and over-the-counter medicines. Any problems you or family members have had with anesthetic medicines. Any bleeding problems you have. Any surgeries you have had. Any medical conditions you have. Whether you are pregnant or may be pregnant. What are the risks? Generally, this is a safe procedure.  However, problems may occur, including: Infection. Bleeding. Allergic reactions to medicines. Damage to nearby structures or organs. A gallstone remaining in the common bile duct. The common bile duct carries bile from the gallbladder to the small intestine. A bile leak from the liver or cystic duct after your gallbladder is removed. What happens before the procedure? When to stop eating and drinking Follow instructions from your health care provider about what you may eat and drink before your procedure. These may include: 8 hours before the procedure Stop eating most foods. Do not eat meat, fried foods, or fatty foods. Eat only light foods, such as toast or crackers. All liquids are okay except energy drinks and alcohol. 6 hours before the procedure Stop eating. Drink only clear liquids, such as water, clear fruit juice, black coffee, plain tea, and sports drinks. Do not drink energy drinks or alcohol. 2 hours before the procedure Stop drinking all liquids. You may be allowed to take medicines with small sips of water. If you do not follow your health care provider's instructions, your procedure may be delayed or canceled. Medicines Ask your health care provider about: Changing or stopping your regular medicines. This is especially important if you are taking diabetes medicines or blood thinners. Taking medicines such as aspirin and ibuprofen. These medicines can thin your blood. Do not take these medicines unless your health care provider tells you to take them. Taking over-the-counter medicines, vitamins, herbs, and supplements. General instructions If you will be going home right after the procedure, plan to have a responsible adult: Take you home from the hospital or clinic. You will not be allowed to drive. Care for you for the  time you are told. Do not use any products that contain nicotine or tobacco for at least 4 weeks before the procedure. These products include cigarettes,  chewing tobacco, and vaping devices, such as e-cigarettes. If you need help quitting, ask your health care provider. Ask your health care provider: How your surgery site will be marked. What steps will be taken to help prevent infection. These may include: Removing hair at the surgery site. Washing skin with a germ-killing soap. Taking antibiotic medicine. What happens during the procedure?  An IV will be inserted into one of your veins. You will be given one or both of the following: A medicine to help you relax (sedative). A medicine to make you fall asleep (general anesthetic). Your surgeon will make several small incisions in your abdomen. The laparoscope will be inserted through one of the small incisions. The camera on the laparoscope will send images to a monitor in the operating room. This lets your surgeon see inside your abdomen. A gas will be pumped into your abdomen. This will expand your abdomen to give the surgeon more room to perform the surgery. Other tools that are needed for the procedure will be inserted through the other incisions. The gallbladder will be removed through one of the incisions. Your common bile duct may be examined. If stones are found in the common bile duct, they may be removed. After your gallbladder has been removed, the incisions will be closed with stitches (sutures), staples, or skin glue. Your incisions will be covered with a bandage (dressing). The procedure may vary among health care providers and hospitals. What happens after the procedure? Your blood pressure, heart rate, breathing rate, and blood oxygen level will be monitored until you leave the hospital or clinic. You will be given medicines as needed to control your pain. You may have a drain placed in the incision. The drain will be removed a day or two after the procedure. Summary Minimally invasive cholecystectomy, also called laparoscopic cholecystectomy, is surgery to remove the  gallbladder using small incisions. Tell your health care provider about all the medical conditions you have and all the medicines you are taking for those conditions. Before the procedure, follow instructions about when to stop eating and drinking and changing or stopping medicines. Plan to have a responsible adult care for you for the time you are told after you leave the hospital or clinic. This information is not intended to replace advice given to you by your health care provider. Make sure you discuss any questions you have with your health care provider. Document Revised: 09/28/2020 Document Reviewed: 09/28/2020 Elsevier Patient Education  [place].

## 2022-07-12 ENCOUNTER — Telehealth: Payer: Self-pay | Admitting: Internal Medicine

## 2022-07-12 ENCOUNTER — Telehealth: Payer: Self-pay | Admitting: Surgery

## 2022-07-12 NOTE — Telephone Encounter (Signed)
Spoke with wife, they have been advised of Pre-Admission date/time, and Surgery date at [facility].  Surgery Date: 09/07/22 Preadmission Testing Date: 08/30/22 (phone 8a-1p)  Patient has been made aware to call [phone], between 1-3:00pm the day before surgery, to find out what time to arrive for surgery.

## 2022-07-12 NOTE — Telephone Encounter (Signed)
Patient needs some clarification letter for work.  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 Accommodations for desk job would be that he can get up and stretch and move as he can not sit for long periods of time. He also needs it to state that he is no longer on medication during the day that would cause him to be mind altering and unable to drive or operate heavy machinery.   He would like to pick this up on Thursday 4/4

## 2022-07-13 ENCOUNTER — Other Ambulatory Visit: Payer: Self-pay | Admitting: Medical

## 2022-07-13 NOTE — Telephone Encounter (Signed)
Pt came by to sign records. Look out for them to come through

## 2022-07-13 NOTE — Telephone Encounter (Signed)
Spoke with wife since pt left his phone here and pt will sign for records

## 2022-07-13 NOTE — Telephone Encounter (Signed)
Patient is doing better but is not quite there. He is still working to get his strength and drop foot better. By doing field work, he doesn't want his pain to flare back up as he is not 100% better. Doing field work he has to drive, get out, move a lot, climb stairs, bend and twist and he just needs some more time. The form that you filled out already has him on restriction until 08/27/22 so they are just needing some more clarification on it.

## 2022-08-21 ENCOUNTER — Ambulatory Visit: Payer: 59 | Admitting: Surgery

## 2022-08-24 ENCOUNTER — Ambulatory Visit: Payer: 59 | Admitting: Medical

## 2022-08-24 ENCOUNTER — Encounter: Payer: Self-pay | Admitting: Medical

## 2022-08-24 VITALS — BP 156/88 | HR 80 | Wt 194.2 lb

## 2022-08-24 DIAGNOSIS — G894 Chronic pain syndrome: Secondary | ICD-10-CM

## 2022-08-24 DIAGNOSIS — G8929 Other chronic pain: Secondary | ICD-10-CM

## 2022-08-24 DIAGNOSIS — F419 Anxiety disorder, unspecified: Secondary | ICD-10-CM

## 2022-08-24 DIAGNOSIS — M5410 Radiculopathy, site unspecified: Secondary | ICD-10-CM

## 2022-08-24 DIAGNOSIS — M5440 Lumbago with sciatica, unspecified side: Secondary | ICD-10-CM

## 2022-08-24 DIAGNOSIS — N529 Male erectile dysfunction, unspecified: Secondary | ICD-10-CM

## 2022-08-24 DIAGNOSIS — M5441 Lumbago with sciatica, right side: Secondary | ICD-10-CM

## 2022-08-24 DIAGNOSIS — R937 Abnormal findings on diagnostic imaging of other parts of musculoskeletal system: Secondary | ICD-10-CM

## 2022-08-24 DIAGNOSIS — M21372 Foot drop, left foot: Secondary | ICD-10-CM

## 2022-08-24 DIAGNOSIS — I10 Essential (primary) hypertension: Secondary | ICD-10-CM

## 2022-08-24 DIAGNOSIS — M48062 Spinal stenosis, lumbar region with neurogenic claudication: Secondary | ICD-10-CM | POA: Diagnosis not present

## 2022-08-24 DIAGNOSIS — Z566 Other physical and mental strain related to work: Secondary | ICD-10-CM

## 2022-08-24 DIAGNOSIS — M47816 Spondylosis without myelopathy or radiculopathy, lumbar region: Secondary | ICD-10-CM

## 2022-08-24 DIAGNOSIS — G4700 Insomnia, unspecified: Secondary | ICD-10-CM

## 2022-08-24 MED ORDER — SILDENAFIL CITRATE 100 MG PO TABS: 50.0000 mg | ORAL_TABLET | Freq: Every day | ORAL | 2 refills | Status: DC | PRN

## 2022-08-24 MED ORDER — VALSARTAN-HYDROCHLOROTHIAZIDE 80-12.5 MG PO TABS: 1.0000 | ORAL_TABLET | Freq: Every day | ORAL | 2 refills | Status: DC

## 2022-08-24 NOTE — Progress Notes (Addendum)
Subjective:  Omar Phillips is a 57 y.o. male who presents for Chief Complaint  Patient presents with   Pt wants to discuss additional 2 month restriction for work      Medical team: Dr. Dairl Ponder, DNP, [facility] and Wellbut[facility] Dr. Genene Churn, DC, [facility], Rayann Heman PT   Here  for follow up on progress in regards to anxiety, insomnia, chronic back pain, radicular pain, leg weakness.   He has continued with chiropractor and psychiatry care.  Both chiropractor and psychiatrist wrote letters on behalf to provide for this visit today here which have been reviewed.  Still seeing PT but they are going to discharge him and take a break for the time being.   Using a brace/orthotic for left foot drop which seems to be working ok.    Still in communication with supervisor at work.   They area aware of his ongoing therapies and time he may need to improve.  HTN - compliant with valsartan 80mg  daily.  Checks BP sometimes at home.  Has been high some.  No chest pain, no palpitations.  Hyperlipidemia - not taking Crestor, was having problems.     Having some issues with erections that he attributes to Lexapro.  Still uses Viagra and it helps.  Wonders if it needs to be higher dose.   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    Can't sit or stand for long periods.  No other aggravating or relieving factors.    No other c/o.  Past Medical History:  Diagnosis Date   Anxiety    Chest pain 2006   hospital eval   Dyslipidemia    GERD (gastroesophageal reflux disease)    History of cardiovascular stress test 2006   [facility]   Hypertension    Current Outpatient Medications on File Prior to Visit  Medication Sig Dispense Refill   escitalopram (LEXAPRO) 10 MG tablet Take 1 tablet (10 mg total) by mouth daily. 30 tablet 0    eszopiclone (LUNESTA) 2 MG TABS tablet Take 1 tablet (2 mg total) by mouth at bedtime as needed for sleep. Take immediately before bedtime 30 tablet 1   Multiple Vitamin (MULTIVITAMIN) capsule Take 1 capsule by mouth daily.     Omega-3 Fatty Acids (FISH OIL PO) Take by mouth.     polyethylene glycol powder (GLYCOLAX/MIRALAX) 17 GM/SCOOP powder Take 17 g by mouth daily. (Patient not taking: Reported on 08/24/2022) 3350 g 0   No current facility-administered medications on file prior to visit.    The following portions of the patient's history were reviewed and updated as appropriate: allergies, current medications, past family history, past medical history, past social history, past surgical history and problem list.  ROS Otherwise as in subjective above    Objective: BP (!) 156/88   Pulse 80   Wt 194 lb 3.2 oz (88.1 kg)   BMI 26.34 kg/m   BP Readings from Last 3 Encounters:  08/24/22 (!) 156/88  07/10/22 (!) 152/89  06/29/22 120/70   Wt Readings from Last 3 Encounters:  08/24/22 194 lb 3.2 oz (88.1 kg)  07/10/22 186 lb 3.2 oz (84.5 kg)  06/29/22 181 lb 12.8 oz (82.5 kg)    General appearance: alert, no distress, well developed, well nourished Psych:pleasant, answers questions appropriately Brace applied to left lower leg, walking without cane today ROM  relative full of back Lungs clear Heart RRR normal s1, s2, no murmurs No edema   Assessment: Encounter Diagnoses  Name Primary?   Spinal stenosis, lumbar region, with neurogenic claudication Yes   Lumbar facet arthropathy    Primary hypertension    Abnormal MRI, lumbar spine    Anxiety    Chronic bilateral low back pain with sciatica, sciatica laterality unspecified    Chronic pain syndrome    Work stress    Radicular pain of left lower extremity    Insomnia, unspecified type    Erectile dysfunction, unspecified erectile dysfunction type      Plan: We discussed his current status, symptoms, treatments and  progress.    Chronic pain, leg weakness, foot drop, chronic back pain I reviewed his chiropractic notes from [facility], letter written to me dated Aug 23, 2022.  Their note states that he is a patient of the clinic for treatment of his spinal health condition, started out requiring a walker within has improved to requiring just a cane for now he is able to walk without the cane for longer periods of time.  Diabetes non surgical spinal decompression therapy alone cold laser therapy.  They recommended he continue to do resting hypoactive treatments for the time being, reducing visits to every 2 weeks now.  They feel like in 2 months, things would be further along with improvement  Anxiety, work stress, insomnia I reviewed a visit note from [place] [facility] from 08/16/2022.  Diagnosis listed include generalized anxiety disorder, primary insomnia, problems related to social environment stressful work schedule -they continue him on Lexapro 10 mg daily, Lunesta 2 mg nightly, and they started him on clonazepam 1 mg at night.  He has follow-up with them in 1 month.  They wrote a specific note stating that they have been managing his medication and counseling for emotional and brain health.  They also noted to that although he has made significant progress, he will need intensive therapy and counseling medication management for at least 8 more weeks with additional possible 4 weeks.  Hypertension - change to Valsartan HCT 80/12.5mg  daily, monitor BP at home  Hyperlipidemia - currently declines treatment  ED - increase to Viagra 100mg . Discussed proper use of medication    Brinton was seen today for pt wants to discuss additional 2 month restriction for work.  Diagnoses and all orders for this visit:  Spinal stenosis, lumbar region, with neurogenic claudication  Lumbar facet arthropathy  Primary hypertension  Abnormal MRI, lumbar spine  Anxiety  Chronic bilateral low back pain with  sciatica, sciatica laterality unspecified  Chronic pain syndrome  Work stress  Radicular pain of left lower extremity  Insomnia, unspecified type  Erectile dysfunction, unspecified erectile dysfunction type  Other orders -     valsartan-hydrochlorothiazide (DIOVAN HCT) 80-12.5 MG tablet; Take 1 tablet by mouth daily. -     sildenafil (VIAGRA) 100 MG tablet; Take 0.5-1 tablets (50-100 mg total) by mouth daily as needed for erectile dysfunction.    Follow up: 2 months

## 2022-08-28 ENCOUNTER — Encounter: Payer: Self-pay | Admitting: Medical

## 2022-08-28 ENCOUNTER — Telehealth: Payer: Self-pay | Admitting: Medical

## 2022-08-28 NOTE — Telephone Encounter (Signed)
Diogenes called and wanted to confirm if Omar Phillips has sent over anything to his HR about being restricted for another 2 months?

## 2022-08-28 NOTE — Telephone Encounter (Signed)
Please advise 

## 2022-08-30 ENCOUNTER — Inpatient Hospital Stay
Admission: RE | Admit: 2022-08-30 | Discharge: 2022-08-30 | Disposition: A | Discharge status: Home / Self Care | Payer: 59 | Source: Ambulatory Visit

## 2022-08-30 HISTORY — DX: Foot drop, left foot: M21.372

## 2022-08-30 HISTORY — DX: Spinal stenosis, lumbar region with neurogenic claudication: M48.062

## 2022-08-30 HISTORY — DX: Lumbago with sciatica, left side: M54.42

## 2022-08-30 HISTORY — DX: Other chronic pain: G89.29

## 2022-08-30 HISTORY — DX: Chronic pain syndrome: G89.4

## 2022-08-30 HISTORY — DX: Radiculopathy, site unspecified: M54.10

## 2022-08-30 HISTORY — DX: Male erectile dysfunction, unspecified: N52.9

## 2022-08-30 HISTORY — DX: Insomnia, unspecified: G47.00

## 2022-08-30 NOTE — Patient Instructions (Addendum)
Your procedure is scheduled on: 09/07/2022  Report to the Registration Desk on the 1st floor of the [facility]. To find out your arrival time, please call [phone] between 1PM - 3PM on: 5/29/ 2024  If your arrival time is 6:00 am, do not arrive before that time as the Medical Mall entrance doors do not open until 6:00 am.  REMEMBER: Instructions that are not followed completely may result in serious medical risk, up to and including death; or upon the discretion of your surgeon and anesthesiologist your surgery may need to be rescheduled.  Do not eat food after midnight the night before surgery.  No gum chewing or hard candies.  You may however, drink CLEAR liquids up to 2 hours before you are scheduled to arrive for your surgery. Do not drink anything within 2 hours of your scheduled arrival time.  Clear liquids include: - water  - apple juice without pulp - gatorade (not RED colors) - black coffee or tea (Do NOT add milk or creamers to the coffee or tea) Do NOT drink anything that is not on this list.     One week prior to surgery: Stop Anti-inflammatories (NSAIDS) such as Advil, Aleve, Ibuprofen, Motrin, Naproxen, Naprosyn and Aspirin based products such as Excedrin, Goody's Powder, BC Powder. Stop ANY OVER THE COUNTER supplements until after surgery. You may however, continue to take Tylenol if needed for pain up until the day of surgery.  Continue taking all prescribed medications with the exception of the following:    Follow recommendations from Cardiologist or PCP regarding stopping blood thinners.  TAKE ONLY THESE MEDICATIONS THE MORNING OF SURGERY WITH A SIP OF WATER:  escitalopram (LEXAPRO)    Use inhalers on the day of surgery and bring to the hospital.  Fleets enema or bowel prep as directed.  No Alcohol for 24 hours before or after surgery.  No Smoking including e-cigarettes for 24 hours before surgery.  No chewable tobacco products for at least 6  hours before surgery.  No nicotine patches on the day of surgery.  Do not use any "recreational" drugs for at least a week (preferably 2 weeks) before your surgery.  Please be advised that the combination of cocaine and anesthesia may have negative outcomes, up to and including death. If you test positive for cocaine, your surgery will be cancelled.  On the morning of surgery brush your teeth with toothpaste and water, you may rinse your mouth with mouthwash if you wish. Do not swallow any toothpaste or mouthwash.  Use CHG Soap or wipes as directed on instruction sheet.  Do not wear jewelry, make-up, hairpins, clips or nail polish.  Do not wear lotions, powders, or perfumes.   Do not shave body hair from the neck down 48 hours before surgery.  Contact lenses, hearing aids and dentures may not be worn into surgery.  Do not bring valuables to the hospital. [facility] is not responsible for any missing/lost belongings or valuables.   Total Shoulder Arthroplasty:  use Benzoyl Peroxide 5% Gel as directed on instruction sheet.  Bring your C-PAP to the hospital in case you may have to spend the night.   Notify your doctor if there is any change in your medical condition (cold, fever, infection).  Wear comfortable clothing (specific to your surgery type) to the hospital.  After surgery, you can help prevent lung complications by doing breathing exercises.  Take deep breaths and cough every 1-2 hours. Your doctor may order a device  called an Facilities manager to help you take deep breaths. When coughing or sneezing, hold a pillow firmly against your incision with both hands. This is called "splinting." Doing this helps protect your incision. It also decreases belly discomfort.  If you are being admitted to the hospital overnight, leave your suitcase in the car. After surgery it may be brought to your room.  In case of increased patient census, it may be necessary for you, the patient,  to continue your postoperative care in the Same Day Surgery department.  If you are being discharged the day of surgery, you will not be allowed to drive home. You will need a responsible individual to drive you home and stay with you for 24 hours after surgery.   If you are taking public transportation, you will need to have a responsible individual with you.  Please call the Pre-admissions Testing Dept. at [phone] if you have any questions about these instructions.  Surgery Visitation Policy:  Patients having surgery or a procedure may have two visitors.  Children under the age of 39 must have an adult with them who is not the patient.  Inpatient Visitation:    Visiting hours are 7 a.m. to 8 p.m. Up to four visitors are allowed at one time in a patient room. The visitors may rotate out with other people during the day.  One visitor age 49 or older may stay with the patient overnight and must be in the room by 8 p.m.      Preparing for Surgery with CHLORHEXIDINE GLUCONATE (CHG) Soap  Chlorhexidine Gluconate (CHG) Soap  o An antiseptic cleaner that kills germs and bonds with the skin to continue killing germs even after washing  o Used for showering the night before surgery and morning of surgery  Before surgery, you can play an important role by reducing the number of germs on your skin.  CHG (Chlorhexidine gluconate) soap is an antiseptic cleanser which kills germs and bonds with the skin to continue killing germs even after washing.  Please do not use if you have an allergy to CHG or antibacterial soaps. If your skin becomes reddened/irritated stop using the CHG.  1. Shower the NIGHT BEFORE SURGERY and the MORNING OF SURGERY with CHG soap.  2. If you choose to wash your hair, wash your hair first as usual with your normal shampoo.  3. After shampooing, rinse your hair and body thoroughly to remove the shampoo.  4. Use CHG as you would any other liquid soap. You  can apply CHG directly to the skin and wash gently with a scrungie or a clean washcloth.  5. Apply the CHG soap to your body only from the neck down. Do not use on open wounds or open sores. Avoid contact with your eyes, ears, mouth, and genitals (private parts). Wash face and genitals (private parts) with your normal soap.  6. Wash thoroughly, paying special attention to the area where your surgery will be performed.  7. Thoroughly rinse your body with warm water.  8. Do not shower/wash with your normal soap after using and rinsing off the CHG soap.  9. Pat yourself dry with a clean towel.  10. Wear clean pajamas to bed the night before surgery.  12. Place clean sheets on your bed the night of your first shower and do not sleep with pets.  13. Shower again with the CHG soap on the day of surgery prior to arriving at the hospital.  14. Do  not apply any deodorants/lotions/powders.  15. Please wear clean clothes to the hospital.

## 2022-08-30 NOTE — Pre-Procedure Instructions (Signed)
Unable to reach patient. Called x 3 left messages to patient's mobile. Also talk to wife and relay message for patient to give [place] a return call. At this  this time, 15: 46 he has not returned a call yet. We will attempt again tomorrow and notify surgeon's office as necessary.

## 2022-08-31 ENCOUNTER — Encounter: Payer: Self-pay | Admitting: Urgent Care

## 2022-08-31 ENCOUNTER — Encounter
Admission: RE | Admit: 2022-08-31 | Discharge: 2022-08-31 | Disposition: A | Discharge status: Home / Self Care | Payer: 59 | Source: Ambulatory Visit | Attending: Surgery | Admitting: Surgery

## 2022-08-31 DIAGNOSIS — Z79899 Other long term (current) drug therapy: Secondary | ICD-10-CM

## 2022-08-31 DIAGNOSIS — Z01812 Encounter for preprocedural laboratory examination: Secondary | ICD-10-CM

## 2022-08-31 DIAGNOSIS — Z01810 Encounter for preprocedural cardiovascular examination: Secondary | ICD-10-CM

## 2022-08-31 DIAGNOSIS — I10 Essential (primary) hypertension: Secondary | ICD-10-CM

## 2022-08-31 HISTORY — DX: Cardiac murmur, unspecified: R01.1

## 2022-08-31 HISTORY — DX: Bacteremia: R78.81

## 2022-08-31 HISTORY — DX: Acute cholecystitis: K81.0

## 2022-08-31 HISTORY — DX: Benign prostatic hyperplasia without lower urinary tract symptoms: N40.0

## 2022-08-31 HISTORY — DX: COVID-19: U07.1

## 2022-08-31 HISTORY — DX: Fatty (change of) liver, not elsewhere classified: K76.0

## 2022-08-31 HISTORY — DX: Acute pancreatitis without necrosis or infection, unspecified: K85.90

## 2022-08-31 HISTORY — DX: Unspecified Escherichia coli (E. coli) as the cause of diseases classified elsewhere: B96.20

## 2022-08-31 NOTE — Patient Instructions (Signed)
Your procedure is scheduled on:09-07-22 Thursday Report to the Registration Desk on the 1st floor of the [facility].Then proceed to the 2nd floor Surgery Desk To find out your arrival time, please call [phone] between 1PM - 3PM on:09-06-22 Wednesday If your arrival time is 6:00 am, do not arrive before that time as the Medical Mall entrance doors do not open until 6:00 am.  REMEMBER: Instructions that are not followed completely may result in serious medical risk, up to and including death; or upon the discretion of your surgeon and anesthesiologist your surgery may need to be rescheduled.  Do not eat food after midnight the night before surgery.  No gum chewing or hard candies.  You may however, drink CLEAR liquids up to 2 hours before you are scheduled to arrive for your surgery. Do not drink anything within 2 hours of your scheduled arrival time.  Clear liquids include: - water  - apple juice without pulp - gatorade (not RED colors) - black coffee or tea (Do NOT add milk or creamers to the coffee or tea) Do NOT drink anything that is not on this list  One week prior to surgery:Stop now 08-31-22 Stop Anti-inflammatories (NSAIDS) such as Advil, Aleve, Ibuprofen, Motrin, Naproxen, Naprosyn and Aspirin based products such as Excedrin, Goody's Powder, BC Powder.You may however, take Tylenol if needed for pain up until the day of surgery. Stop ANY OVER THE COUNTER supplements/vitamins NOW (08-31-22) until after surgery (Multivitamin and Fish Oil)  Stop your sildenafil (VIAGRA) 2 days prior to surgery-Last dose will be on 09-04-22 Monday  TAKE ONLY THESE MEDICATIONS THE MORNING OF SURGERY WITH A SIP OF WATER: -escitalopram (LEXAPRO)   Continue your valsartan-hydrochlorothiazide (DIOVAN HCT) up until the day prior to surgery-Do NOT take the morning of surgery  No Alcohol for 24 hours before or after surgery.  No Smoking including e-cigarettes for 24 hours before surgery.  No chewable  tobacco products for at least 6 hours before surgery.  No nicotine patches on the day of surgery.  Do not use any "recreational" drugs for at least a week (preferably 2 weeks) before your surgery.  Please be advised that the combination of cocaine and anesthesia may have negative outcomes, up to and including death. If you test positive for cocaine, your surgery will be cancelled.  On the morning of surgery brush your teeth with toothpaste and water, you may rinse your mouth with mouthwash if you wish. Do not swallow any toothpaste or mouthwash.  Use CHG Soap as directed on instruction sheet.  Do not wear jewelry, make-up, hairpins, clips or nail polish.  Do not wear lotions, powders, or perfumes.   Do not shave body hair from the neck down 48 hours before surgery.  Contact lenses, hearing aids and dentures may not be worn into surgery.  Do not bring valuables to the hospital. [facility] is not responsible for any missing/lost belongings or valuables.  Notify your doctor if there is any change in your medical condition (cold, fever, infection).  Wear comfortable clothing (specific to your surgery type) to the hospital.  After surgery, you can help prevent lung complications by doing breathing exercises.  Take deep breaths and cough every 1-2 hours. Your doctor may order a device called an Incentive Spirometer to help you take deep breaths. When coughing or sneezing, hold a pillow firmly against your incision with both hands. This is called "splinting." Doing this helps protect your incision. It also decreases belly discomfort.  If you  are being admitted to the hospital overnight, leave your suitcase in the car. After surgery it may be brought to your room.  In case of increased patient census, it may be necessary for you, the patient, to continue your postoperative care in the Same Day Surgery department.  If you are being discharged the day of surgery, you will not be allowed to  drive home. You will need a responsible individual to drive you home and stay with you for 24 hours after surgery.   If you are taking public transportation, you will need to have a responsible individual with you.  Please call the Pre-admissions Testing Dept. at [phone] if you have any questions about these instructions.  Surgery Visitation Policy:  Patients having surgery or a procedure may have two visitors.  Children under the age of 28 must have an adult with them who is not the patient.     Preparing for Surgery with CHLORHEXIDINE GLUCONATE (CHG) Soap  Chlorhexidine Gluconate (CHG) Soap  o An antiseptic cleaner that kills germs and bonds with the skin to continue killing germs even after washing  o Used for showering the night before surgery and morning of surgery  Before surgery, you can play an important role by reducing the number of germs on your skin.  CHG (Chlorhexidine gluconate) soap is an antiseptic cleanser which kills germs and bonds with the skin to continue killing germs even after washing.  Please do not use if you have an allergy to CHG or antibacterial soaps. If your skin becomes reddened/irritated stop using the CHG.  1. Shower the NIGHT BEFORE SURGERY and the MORNING OF SURGERY with CHG soap.  2. If you choose to wash your hair, wash your hair first as usual with your normal shampoo.  3. After shampooing, rinse your hair and body thoroughly to remove the shampoo.  4. Use CHG as you would any other liquid soap. You can apply CHG directly to the skin and wash gently with a scrungie or a clean washcloth.  5. Apply the CHG soap to your body only from the neck down. Do not use on open wounds or open sores. Avoid contact with your eyes, ears, mouth, and genitals (private parts). Wash face and genitals (private parts) with your normal soap.  6. Wash thoroughly, paying special attention to the area where your surgery will be performed.  7. Thoroughly  rinse your body with warm water.  8. Do not shower/wash with your normal soap after using and rinsing off the CHG soap.  9. Pat yourself dry with a clean towel.  10. Wear clean pajamas to bed the night before surgery.  12. Place clean sheets on your bed the night of your first shower and do not sleep with pets.  13. Shower again with the CHG soap on the day of surgery prior to arriving at the hospital.  14. Do not apply any deodorants/lotions/powders.  15. Please wear clean clothes to the hospital.

## 2022-08-31 NOTE — Pre-Procedure Instructions (Signed)
Pt states he tested +COVID on 08-23-22. Pt still states he is feeling bad. Pt still coughing and sounds stopped up when talking with him/I informed pt to call Dr Windell Moment office on Tuesday once everyone is back in the office from [place] day and let them know how he is feeling. Pt verbalized understanding

## 2022-09-01 ENCOUNTER — Ambulatory Visit: Payer: 59 | Admitting: Surgery

## 2022-09-05 ENCOUNTER — Inpatient Hospital Stay: Admission: RE | Admit: 2022-09-05 | Payer: 59 | Source: Ambulatory Visit

## 2022-09-05 ENCOUNTER — Other Ambulatory Visit: Payer: Self-pay | Admitting: Medical

## 2022-09-06 ENCOUNTER — Telehealth: Payer: Self-pay | Admitting: Surgery

## 2022-09-06 ENCOUNTER — Ambulatory Visit: Payer: 59 | Admitting: Surgery

## 2022-09-06 NOTE — Telephone Encounter (Signed)
Patient called the answering service after hours and caller states the following, "has shakes, hot flashes, leg pain, is about to go to the ER, also has lingering Covid for 2 plus weeks now"  Appointment for 5/29 is cancelled for follow up in office prior to surgery and surgery scheduled 09/07/22 is cancelled at patient's request.  Did call the patient and left a message for him to call.  Patient will need follow up in office with Dr. Aleen Campi prior to rescheduling surgery.

## 2022-09-07 ENCOUNTER — Ambulatory Visit: Admission: RE | Admit: 2022-09-07 | Payer: 59 | Source: Home / Self Care | Admitting: Surgery

## 2022-09-07 ENCOUNTER — Encounter: Admission: RE | Payer: Self-pay | Source: Home / Self Care

## 2022-09-07 SURGERY — CHOLECYSTECTOMY, ROBOT-ASSISTED, LAPAROSCOPIC
Anesthesia: General

## 2022-11-04 IMAGING — CT CT HEART MORP W/ CTA COR W/ SCORE W/ CA W/CM &/OR W/O CM
1 series · 7 of 9 positions shown, 9 images · IV contrast (omnipaque)
Comparison: None.
COMPARISON: None.

Addendum:
EXAM:
OVER-READ INTERPRETATION  CT CHEST

The following report is an over-read performed by radiologist Dr.
Pineah Dopekido [REDACTED] on 07/28/2020. This
over-read does not include interpretation of cardiac or coronary
anatomy or pathology. The coronary calcium score/coronary CTA
interpretation by the cardiologist is attached.
HISTORY: Chest pain/anginal equiv, ECGs or troponins abnormal
Cardiac/Coronary  CT
TECHNIQUE: The patient was scanned on a Siemens Force scanner.
PROTOCOL: A 120 kV prospective scan was triggered in the descending thoracic
aorta at 111 HU's. Axial non-contrast 3 mm slices were carried out
through the heart. The data set was analyzed on a dedicated work
station and scored using the Agatson method. Gantry rotation speed
was 250 msecs and collimation was .6 mm. No IV beta blockade but
mg of sl NTG was given. The 3D data set was reconstructed in 5%
intervals of the 67-82 % of the R-R cycle. Diastolic phases were
analyzed on a dedicated work station using MPR, MIP and VRT modes.
The patient received 100mL OMNIPAQUE IOHEXOL 350 MG/ML SOLN of
contrast.

[Series 323: — · 0.43mm/px · 7 of 9 slices shown, 9 images]
[im 2/9  vessel]
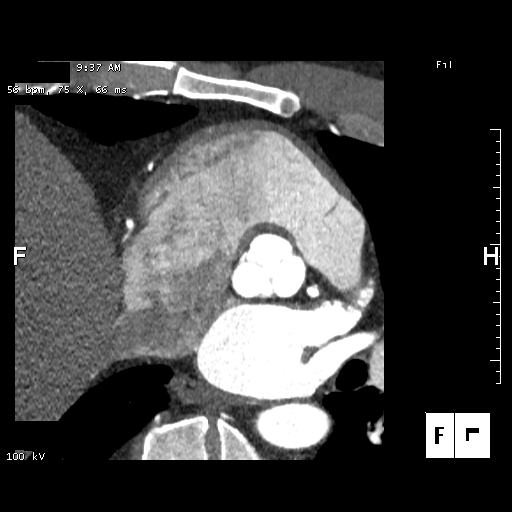
[im 2/9  lung]
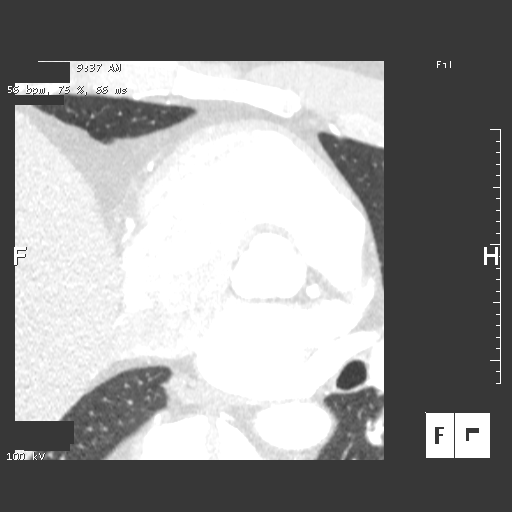
[im 3/9  vessel]
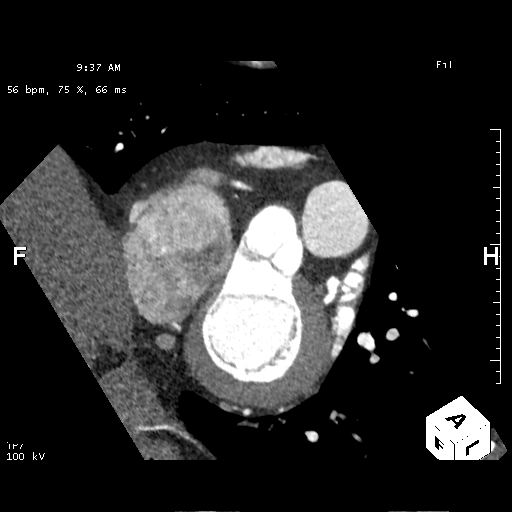
[im 4/9  vessel]
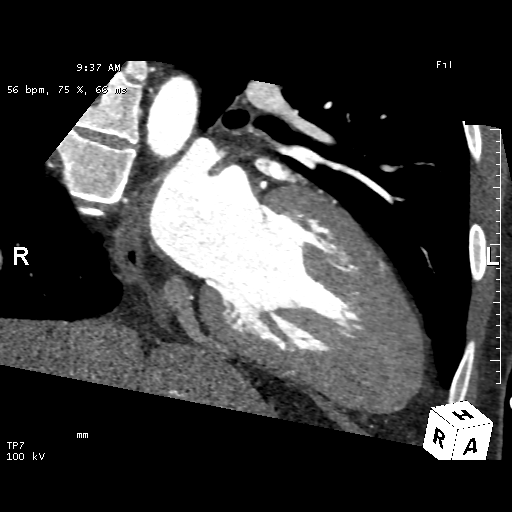
[im 5/9  vessel]
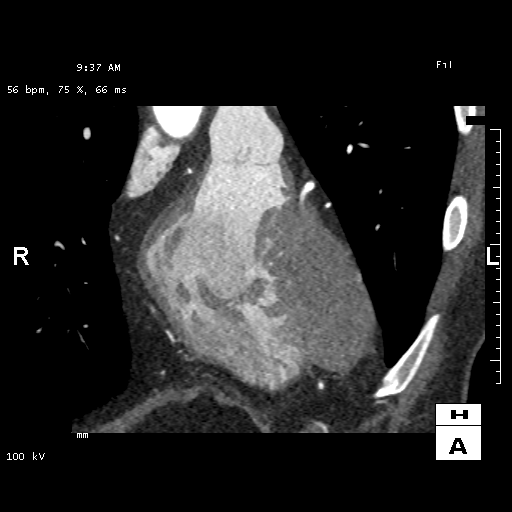
[im 6/9  vessel]
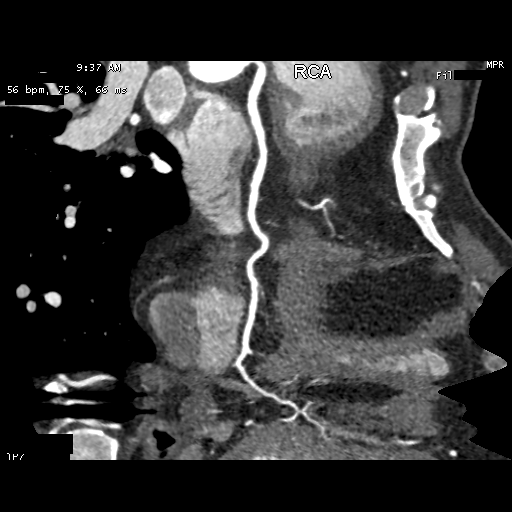
[im 6/9  lung]
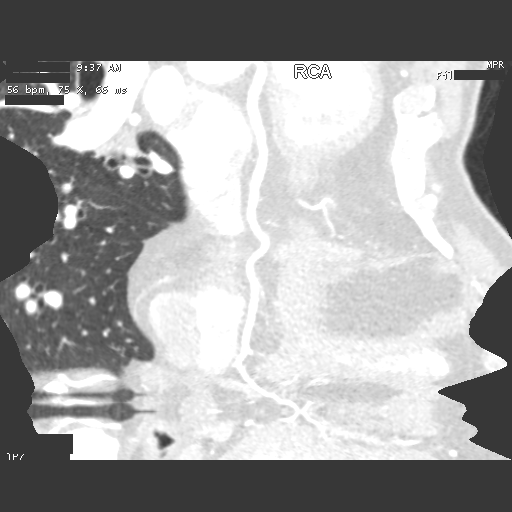
[im 7/9  vessel]
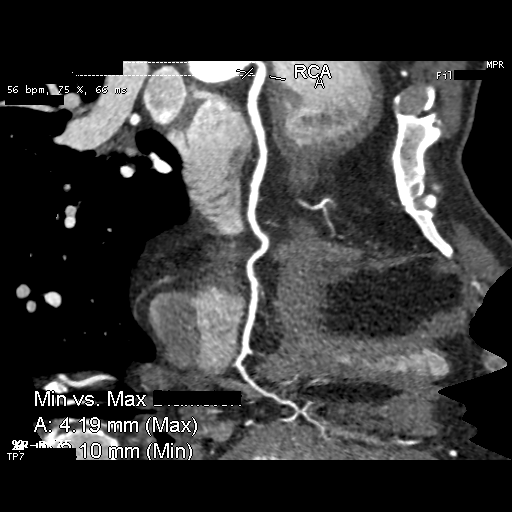
[im 8/9  vessel]
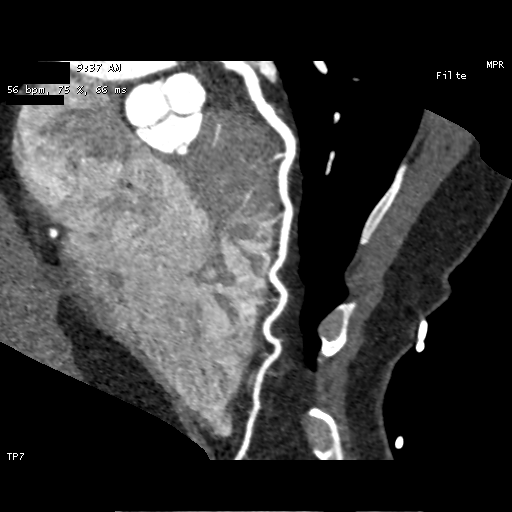

[7 of 9 positions shown; findings below may reference images not displayed]

FINDINGS: Within the visualized portions of the thorax there are no suspicious
appearing pulmonary nodules or masses, there is no acute
consolidative airspace disease, no pleural effusions, no
pneumothorax and no lymphadenopathy. Visualized portions of the
upper abdomen are unremarkable. There are no aggressive appearing
lytic or blastic lesions noted in the visualized portions of the
skeleton.
IMPRESSION: 1. No significant incidental noncardiac findings are noted.
FINDINGS: Image quality: Excellent.

Noise artifact is: Limited.

Coronary artery calcification score:

Left main:

Left anterior descending artery:

Left circumflex artery: 0

Right coronary artery: 0

Total coronary calcium score of 19.5, places the patient at the 80th
percentile for age and sex matched control.

Coronary arteries: Normal coronary origins.  Right dominance.

Left Main Coronary Artery: The left main is a large caliber vessel
with a normal take off from the left coronary cusp that bifurcates
to form a left anterior descending artery and a left circumflex
artery. There is no plaque or stenosis.

Left Anterior Descending Coronary Artery: Normal caliber vessel that
wraps the apex and gives off 3 patent diagonal branches. Minimal
stenosis (0-24%) due to calcified plaque in the proximal / mid LAD.
Mid to distal segment are patent without evidence of plaque or
stenosis.

Left Circumflex Artery: Normal caliber vessel, non-dominant, courses
the atrioventricular groove and gives off 2 patent obtuse marginal
branches. No evidence of plaque or stenosis.

Right Coronary Artery: The RCA is dominant with normal take off from
the right coronary cusp. The RCA terminates as a PDA and right
posterolateral branch. Mild stenosis (25-49%) at the ostial RCA due
to non-calcified plaque. Remainder of the vessel is patent without
significant evidence of plaque or stenosis.

Left Atrium: Grossly normal in size with no left atrial appendage
filling defect.

Left Ventricle: Grossly normal in size. There are no stigmata of
prior infarction. There is no abnormal filling defect.

Pulmonary arteries: Normal in size without proximal filling defect.

Pulmonary veins: Normal pulmonary venous drainage.

Aorta: Normal size, 26 mm at the mid ascending aorta (level of the
PA bifurcation) measured double oblique. No calcifications. No
dissection.

Pericardium: Normal thickness with no significant effusion or
calcium present.

Cardiac valves: The aortic valve is trileaflet without
calcification. The mitral valve is normal structure without
calcification.

Extra-cardiac findings: See attached radiology report for
non-cardiac structures.
IMPRESSION: 1. Coronary calcium score of 19.5 ZEINAB. This was 80th percentile for
age and sex matched control.

2. Normal coronary origin with right dominance.

3. CAD-RADS = 2.

Minimal stenosis (0-24%) due to calcified plaque in the proximal /
mid LAD. Mid to distal segment are patent without evidence of plaque
or stenosis.

Mild stenosis (25-49%) at the ostial RCA due to non-calcified
plaque.

4. Study is sent for CT-FFR to further evaluate the ostial RCA
stenosis. Findings will be performed and reported separately.

RECOMMENDATIONS:

Consider non-atherosclerotic causes of chest pain. Consider
preventive therapy and risk factor modification.

*** End of Addendum ***
EXAM:
OVER-READ INTERPRETATION  CT CHEST

The following report is an over-read performed by radiologist Dr.
Pineah Dopekido [REDACTED] on 07/28/2020. This
over-read does not include interpretation of cardiac or coronary
anatomy or pathology. The coronary calcium score/coronary CTA
interpretation by the cardiologist is attached.
FINDINGS: Within the visualized portions of the thorax there are no suspicious
appearing pulmonary nodules or masses, there is no acute
consolidative airspace disease, no pleural effusions, no
pneumothorax and no lymphadenopathy. Visualized portions of the
upper abdomen are unremarkable. There are no aggressive appearing
lytic or blastic lesions noted in the visualized portions of the
skeleton.
IMPRESSION: 1. No significant incidental noncardiac findings are noted.

## 2022-12-01 ENCOUNTER — Telehealth: Payer: Self-pay | Admitting: Medical

## 2022-12-01 NOTE — Telephone Encounter (Signed)
Omar Phillips called and he states he would like to discontinue the second bp medicine he was on because it is making his body feel weird like dizziness.

## 2023-01-04 ENCOUNTER — Other Ambulatory Visit: Payer: 59

## 2023-02-13 ENCOUNTER — Other Ambulatory Visit: Payer: Self-pay | Admitting: Medical

## 2023-02-16 ENCOUNTER — Ambulatory Visit: Payer: 59 | Admitting: Medical

## 2023-02-16 VITALS — BP 120/70 | HR 63 | Wt 201.2 lb

## 2023-02-16 DIAGNOSIS — I10 Essential (primary) hypertension: Secondary | ICD-10-CM

## 2023-02-16 DIAGNOSIS — R252 Cramp and spasm: Secondary | ICD-10-CM

## 2023-02-16 DIAGNOSIS — Z1159 Encounter for screening for other viral diseases: Secondary | ICD-10-CM

## 2023-02-16 DIAGNOSIS — I2510 Atherosclerotic heart disease of native coronary artery without angina pectoris: Secondary | ICD-10-CM | POA: Diagnosis not present

## 2023-02-16 DIAGNOSIS — Z7185 Encounter for immunization safety counseling: Secondary | ICD-10-CM | POA: Diagnosis not present

## 2023-02-16 DIAGNOSIS — E785 Hyperlipidemia, unspecified: Secondary | ICD-10-CM

## 2023-02-16 DIAGNOSIS — R42 Dizziness and giddiness: Secondary | ICD-10-CM

## 2023-02-16 NOTE — Progress Notes (Signed)
Subjective:  Omar Phillips is a 57 y.o. male who presents for Chief Complaint  Patient presents with   Follow-up    Follow-up on BP.      Here for f/u on BP.  HTN - currently taking Valsartan HCT 80/12.5mg  daily.  Checks BP some.  Sometimes normal readings, sometimes high.  Sometimes feels lightheaded or dizzy.  He has not necessarily seen low blood pressure readings.  No chest pain.  No shortness of breath.  No edema.  Back at job full time . Working nights now, 7 days on, 7 days off.  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   Taking OTC fish oil.   Takes clonazepam some, not every day.  In the mornings gets ache in calf or feels cramping in legs for a few seconds.  Lately no leg or back pain at all.  Doing exercise regularly.   Using no cane or walker currently.  Back to running.    Was going to retire this year, but now in his new role, he may stay on 1-2 years more.    No other aggravating or relieving factors.    No other c/o.  Past Medical History:  Diagnosis Date   Acute cholecystitis    Anxiety    Chest pain 2006   hospital eval   Chronic bilateral low back pain with sciatica, sciatica laterality unspecified    Chronic pain syndrome    COVID-19    Dyslipidemia    E coli bacteremia    Enlarged prostate    Erectile dysfunction, unspecified erectile dysfunction type    Foot drop, left    GERD (gastroesophageal reflux disease)    Heart murmur    Hepatic steatosis    History of cardiovascular stress test 2006   [facility]   Hypertension    Insomnia, unspecified type    Pancreatitis    Radicular pain of left lower extremity    Spinal stenosis, lumbar region with neurogenic claudication    Current Outpatient Medications on File Prior to Visit  Medication Sig Dispense Refill   clonazePAM (KLONOPIN) 1 MG tablet Take 1 mg by mouth at bedtime as needed (SLEEP).     escitalopram (LEXAPRO) 10 MG tablet Take 1 tablet by mouth once daily 90 tablet 0   Multiple Vitamin  (MULTIVITAMIN) capsule Take 1 capsule by mouth daily.     Omega-3 Fatty Acids (FISH OIL PO) Take 1 tablet by mouth daily at 6 (six) AM.     sildenafil (VIAGRA) 100 MG tablet Take 0.5-1 tablets (50-100 mg total) by mouth daily as needed for erectile dysfunction. 15 tablet 2   valsartan-hydrochlorothiazide (DIOVAN-HCT) 80-12.5 MG tablet Take 1 tablet by mouth once daily 30 tablet 0   No current facility-administered medications on file prior to visit.     The following portions of the patient's history were reviewed and updated as appropriate: allergies, current medications, past family history, past medical history, past social history, past surgical history and problem list.  ROS Otherwise as in subjective above    Objective: BP 120/70   Pulse 63   Wt 201 lb 3.2 oz (91.3 kg)   BMI 27.29 kg/m   Wt Readings from Last 3 Encounters:  02/16/23 201 lb 3.2 oz (91.3 kg)  08/24/22 194 lb 3.2 oz (88.1 kg)  07/10/22 186 lb 3.2 oz (84.5 kg)   BP Readings from Last 3 Encounters:  02/16/23 120/70  08/24/22 (!) 156/88  07/10/22 (!) 152/89    General appearance:  alert, no distress, well developed, well nourished Oral cavity: MMM, no lesions Neck: supple, no lymphadenopathy, no thyromegaly, no masses Heart: RRR, normal S1, S2, no murmurs Lungs: CTA bilaterally, no wheezes, rhonchi, or rales Pulses: 2+ radial pulses, 2+ pedal pulses, normal cap refill Ext: no edema   Assessment: Encounter Diagnoses  Name Primary?   Primary hypertension Yes   Dyslipidemia    Vaccine counseling    Coronary artery disease involving native coronary artery without angina pectoris, unspecified whether native or transplanted heart    Leg cramp    Need for hepatitis C screening test      Plan: Glad to see he is doing a lot better particular with his back and leg issues from last year.  He is actually back in the gym and running  Dyslipidemia, CAD on prior scan-not currently on statin.  He takes and  fish oil.  Updated labs today.  He has not tolerated statins in the past  Leg cramp-updated labs today  Hypertension-my reading today was 135/86.  Advise he start keeping a record of his blood pressure readings.  If any readings low less than 105/60 then let me know  Lightheadedness at times-updated labs today.  Continue drink plenty of water throughout the day.  Keep a record of blood pressure readings and bring to next visit or let me know if seeing low readings in the near future  Omar Phillips was seen today for follow-up.  Diagnoses and all orders for this visit:  Primary hypertension -     Comprehensive metabolic panel -     CBC  Dyslipidemia -     Comprehensive metabolic panel -     Lipid panel  Vaccine counseling  Coronary artery disease involving native coronary artery without angina pectoris, unspecified whether native or transplanted heart  Leg cramp -     Comprehensive metabolic panel -     CBC -     Magnesium  Need for hepatitis C screening test -     Hepatitis C antibody    Follow up: pending labs

## 2023-02-17 LAB — HEPATITIS C ANTIBODY: Hep C Virus Ab: NONREACTIVE

## 2023-02-17 LAB — COMPREHENSIVE METABOLIC PANEL
ALT: 30 IU/L (ref 0–44)
AST: 32 [IU]/L (ref 0–40)
Albumin: 4.5 g/dL (ref 3.8–4.9)
Alkaline Phosphatase: 75 [IU]/L (ref 44–121)
BUN/Creatinine Ratio: 13 (ref 9–20)
BUN: 15 mg/dL (ref 6–24)
Bilirubin Total: 0.5 mg/dL (ref 0.0–1.2)
CO2: 23 mmol/L (ref 20–29)
Calcium: 9.9 mg/dL (ref 8.7–10.2)
Chloride: 104 mmol/L (ref 96–106)
Creatinine, Ser: 1.18 mg/dL (ref 0.76–1.27)
Globulin, Total: 3 g/dL (ref 1.5–4.5)
Glucose: 133 mg/dL — ABNORMAL HIGH (ref 70–99)
Potassium: 4.6 mmol/L (ref 3.5–5.2)
Sodium: 139 mmol/L (ref 134–144)
Total Protein: 7.5 g/dL (ref 6.0–8.5)
eGFR: 73 mL/min/{1.73_m2} (ref >59)

## 2023-02-17 LAB — CBC
Hematocrit: 46.6 % (ref 37.5–51.0)
Hemoglobin: 15.1 g/dL (ref 13.0–17.7)
MCH: 30 pg (ref 26.6–33.0)
MCHC: 32.4 g/dL (ref 31.5–35.7)
MCV: 93 fL (ref 79–97)
Platelets: 299 10*3/uL (ref 150–450)
RBC: 5.04 x10E6/uL (ref 4.14–5.80)
RDW: 12.8 % (ref 11.6–15.4)
WBC: 4.3 10*3/uL (ref 3.4–10.8)

## 2023-02-17 LAB — LIPID PANEL
Chol/HDL Ratio: 5.9 ratio — ABNORMAL HIGH (ref 0.0–5.0)
Cholesterol, Total: 293 mg/dL — ABNORMAL HIGH (ref 100–199)
HDL: 50 mg/dL (ref >39)
LDL Chol Calc (NIH): 211 mg/dL — ABNORMAL HIGH (ref 0–99)
Triglycerides: 168 mg/dL — ABNORMAL HIGH (ref 0–149)
VLDL Cholesterol Cal: 32 mg/dL (ref 5–40)

## 2023-02-17 LAB — MAGNESIUM: Magnesium: 2 mg/dL (ref 1.6–2.3)

## 2023-02-18 NOTE — Progress Notes (Signed)
Results sent through MyChart

## 2023-04-02 ENCOUNTER — Other Ambulatory Visit: Payer: Self-pay | Admitting: Medical

## 2023-05-18 ENCOUNTER — Other Ambulatory Visit: Payer: Self-pay | Admitting: Medical

## 2023-06-19 ENCOUNTER — Other Ambulatory Visit: Payer: Self-pay | Admitting: Medical

## 2023-08-28 ENCOUNTER — Other Ambulatory Visit: Payer: Self-pay | Admitting: Medical

## 2023-08-29 NOTE — Telephone Encounter (Signed)
 Is this okay to refill?

## 2023-10-15 ENCOUNTER — Other Ambulatory Visit: Payer: Self-pay | Admitting: Medical

## 2023-11-28 ENCOUNTER — Encounter: Admitting: Medical

## 2023-11-28 DIAGNOSIS — Z0000 Encounter for general adult medical examination without abnormal findings: Secondary | ICD-10-CM

## 2023-12-14 ENCOUNTER — Telehealth: Payer: Self-pay

## 2023-12-14 ENCOUNTER — Telehealth: Admitting: Family Medicine

## 2023-12-14 DIAGNOSIS — U071 COVID-19: Secondary | ICD-10-CM | POA: Diagnosis not present

## 2023-12-14 MED ORDER — BENZONATATE 200 MG PO CAPS: 200.0000 mg | ORAL_CAPSULE | Freq: Two times a day (BID) | ORAL | 0 refills | Status: AC | PRN

## 2023-12-14 NOTE — Telephone Encounter (Signed)
 Copied from CRM [id number]. Topic: Clinical - Medication Question >> Dec 14, 2023 11:48 AM Jasmin G wrote: Reason for CRM: Pt called to request med to treat covid, he states that he took 2 at home tests that came out positive, both of them, and has been dealing with a lot of cough, call him back if needed at [phone], his preferred pharmacy is [id number] - [place] ([place]), [place] - [address].

## 2023-12-14 NOTE — Progress Notes (Signed)
 E-Visit  for Positive Covid Test Result   We are sorry you are not feeling well. We are here to help!  You have tested positive for COVID-19, meaning that you were infected with the novel coronavirus and could give the virus to others.  Most people with COVID-19 have mild illness and can recover at home without medical care. Do not leave your home, except to get medical care. Do not visit public areas and do not go to places where you are unable to wear a mask. It is important that you stay home  to take care for yourself and to help protect other people in your home and community.      Isolation Instructions:   You are to isolate at home until you have been fever free for at least 24 hours without a fever-reducing medication, and symptoms have been steadily improving for 24 hours. At that time,  you can end isolation but need to mask for an additional 5 days.  If you must be around other household members who do not have symptoms, you need to make sure that both you and the family members are masking consistently with a high-quality mask.  If you note any worsening of symptoms despite treatment, please seek an in-person evaluation ASAP. If you note any significant shortness of breath or any chest pain, please seek ER evaluation. Please do not delay care!   Go to the nearest hospital ED for assessment if fever/cough/breathlessness are severe or illness seems like a threat to life.    The following symptoms may appear 2-14 days after exposure: Fever Cough Shortness of breath or difficulty breathing Chills Repeated shaking with chills Muscle pain Headache Sore throat New loss of taste or smell Fatigue Congestion or runny nose Nausea or vomiting Diarrhea  You can use medication such as I have prescribed Tessalon  Perles 100 mg. You may take 1-2 capsules every 8 hours as needed for cough  You may also take acetaminophen (Tylenol) as needed for fever.  HOME CARE: Only take  medications as instructed by your medical team. Drink plenty of fluids and get plenty of rest. A steam or ultrasonic humidifier can help if you have congestion.   GET HELP RIGHT AWAY IF YOU HAVE EMERGENCY WARNING SIGNS.  Call 911 or proceed to your closest emergency facility if: You develop worsening high fever. Trouble breathing Bluish lips or face Persistent pain or pressure in the chest New confusion Inability to wake or stay awake You cough up blood. Your symptoms become more severe Inability to hold down food or fluids  This list is not all possible symptoms. Contact your medical provider for any symptoms that are severe or concerning to you.   Your e-visit answers were reviewed by a board certified advanced clinical practitioner to complete your personal care plan.  Depending on the condition, your plan could have included both over the counter or prescription medications.  If there is a problem please reply once you have received a response from your provider.  Your safety is important to us .  If you have drug allergies check your prescription carefully.    You can use MyChart to ask questions about today's visit, request a non-urgent call back, or ask for a work or school excuse for 24 hours related to this e-Visit. If it has been greater than 24 hours you will need to follow up with your provider, or enter a new e-Visit to address those concerns. You will get an e-mail in  the next two days asking about your experience.  I hope that your e-visit has been valuable and will speed your recovery. Thank you for using e-visits.   have provided 5 minutes of non face to face time during this encounter for chart review and documentation.

## 2023-12-14 NOTE — Telephone Encounter (Signed)
 Patient called stated he had been waiting for someone to call him back. He says he tested positive for Covid and needed an appt. Spoke with CAL to see what could be done and was advised to have patient try to get a virtual appt through his mychart with a [facility] provider. Patient said he would try.

## 2023-12-17 ENCOUNTER — Telehealth (INDEPENDENT_AMBULATORY_CARE_PROVIDER_SITE_OTHER): Admitting: Family Medicine

## 2023-12-17 DIAGNOSIS — U071 COVID-19: Secondary | ICD-10-CM

## 2023-12-17 MED ORDER — METHYLPREDNISOLONE 4 MG PO TBPK
ORAL_TABLET | ORAL | 0 refills | Status: DC
Start: 2023-12-17 — End: 2023-12-25

## 2023-12-17 NOTE — Progress Notes (Signed)
   Name: Shashank Kwasnik   Date of Visit: 12/17/23   Date of last visit with me: Visit date not found   CHIEF COMPLAINT:  Chief Complaint  Patient presents with   Covid Positive    Positive covid test        HPI:  Discussed the use of AI scribe software for clinical note transcription with the patient, who gave verbal consent to proceed. Telehealth visit was done via video, both patient and doctor are in the state of [place]. History of Present Illness Omar Phillips is a 58 year old male who presents with persistent cough and mucus production.  He has been experiencing a persistent cough for the past four to five days, which is particularly bothersome at night. The cough is accompanied by mucus buildup in his throat.  The symptoms began approximately four to five days ago, around Thursday of last week. Initially, he felt well but then noticed the onset of symptoms, which he describes as not feeling normal. Tested positive for covid with an at home test.  No shortness of breath is reported.      OBJECTIVE:       04/05/2022    8:26 AM  Depression screen PHQ 2/9  Decreased Interest 0  Down, Depressed, Hopeless 0  PHQ - 2 Score 0     BP Readings from Last 3 Encounters:  02/16/23 120/70  08/24/22 (!) 156/88  07/10/22 (!) 152/89    There were no vitals taken for this visit.   Physical Exam    Physical Exam Constitutional:      General: He is not in acute distress.    Appearance: Normal appearance.  Neurological:     Mental Status: He is alert.       ASSESSMENT/PLAN:   Assessment & Plan COVID-19    Assessment and Plan Assessment & Plan COVID-19 infection COVID-19 infection with symptoms starting more than five days ago, including persistent nocturnal cough and mucus accumulation. No shortness of breath. Antiviral treatment not initiated due to borderline timing. - Prescribe medrol  dose pack - Recommend over-the-counter Mucinex for mucus  reduction. - Advise use of Flonase nasal spray to reduce mucus.     Genene Kilman A. Vita MD [facility]

## 2023-12-17 NOTE — Telephone Encounter (Signed)
 Pt scheduled

## 2023-12-25 ENCOUNTER — Encounter: Payer: Self-pay | Admitting: Medical

## 2023-12-25 ENCOUNTER — Ambulatory Visit (INDEPENDENT_AMBULATORY_CARE_PROVIDER_SITE_OTHER): Admitting: Medical

## 2023-12-25 VITALS — BP 124/80 | HR 68 | Ht 71.0 in | Wt 204.4 lb

## 2023-12-25 DIAGNOSIS — Z0000 Encounter for general adult medical examination without abnormal findings: Secondary | ICD-10-CM

## 2023-12-25 DIAGNOSIS — E785 Hyperlipidemia, unspecified: Secondary | ICD-10-CM | POA: Diagnosis not present

## 2023-12-25 DIAGNOSIS — Z7185 Encounter for immunization safety counseling: Secondary | ICD-10-CM

## 2023-12-25 DIAGNOSIS — R7301 Impaired fasting glucose: Secondary | ICD-10-CM | POA: Insufficient documentation

## 2023-12-25 DIAGNOSIS — M609 Myositis, unspecified: Secondary | ICD-10-CM | POA: Insufficient documentation

## 2023-12-25 DIAGNOSIS — I10 Essential (primary) hypertension: Secondary | ICD-10-CM | POA: Diagnosis not present

## 2023-12-25 DIAGNOSIS — Z1389 Encounter for screening for other disorder: Secondary | ICD-10-CM

## 2023-12-25 DIAGNOSIS — Z125 Encounter for screening for malignant neoplasm of prostate: Secondary | ICD-10-CM

## 2023-12-25 DIAGNOSIS — R059 Cough, unspecified: Secondary | ICD-10-CM

## 2023-12-25 DIAGNOSIS — R252 Cramp and spasm: Secondary | ICD-10-CM | POA: Insufficient documentation

## 2023-12-25 DIAGNOSIS — N400 Enlarged prostate without lower urinary tract symptoms: Secondary | ICD-10-CM

## 2023-12-25 DIAGNOSIS — M48062 Spinal stenosis, lumbar region with neurogenic claudication: Secondary | ICD-10-CM

## 2023-12-25 MED ORDER — FISH OIL + D3 1200-1000 MG-UNIT PO CAPS: 1.0000 | ORAL_CAPSULE | Freq: Two times a day (BID) | ORAL | 2 refills | Status: AC

## 2023-12-25 MED ORDER — VALSARTAN-HYDROCHLOROTHIAZIDE 80-12.5 MG PO TABS: 1.0000 | ORAL_TABLET | Freq: Every day | ORAL | 2 refills | Status: AC

## 2023-12-25 MED ORDER — SILDENAFIL CITRATE 100 MG PO TABS: 100.0000 mg | ORAL_TABLET | ORAL | 2 refills | Status: AC | PRN

## 2023-12-25 MED ORDER — AMOXICILLIN 875 MG PO TABS: 875.0000 mg | ORAL_TABLET | Freq: Two times a day (BID) | ORAL | 0 refills | Status: AC

## 2023-12-25 NOTE — Progress Notes (Signed)
 Subjective:   HPI  Omar Phillips is a 58 y.o. male who presents for Chief Complaint  Patient presents with   Annual Exam    Nonfasting cpe, declines flu shot    Patient Care Team: Jerrold Haskell, Alm RAMAN, PA-C as PCP - General (Family Medicine)   Concerns: Omar Phillips is a 58 year old male who presents for well visit, non fasting, and with ongoing cough and drainage.  He tested positive for COVID-19 approximately two to three weeks ago and continues to experience symptoms of cough and nasal drainage. He has been using Tessalon  Pearl for the cough. No throat pain reported. No fever reported.  He experiences cramps in his left lower leg, occurring exclusively at night. These cramps do not occur during the day.  He is currently taking valsartan  80/12.5 mg daily for blood pressure, Viagra  as needed, and over-the-counter fish oil  and multivitamins. He has a history of intolerance to statins, specifically Crestor  and Lipitor, due to adverse reactions. He mentions a history of elevated cholesterol and previous blood work showing elevated blood sugar, normal blood counts, and magnesium  levels. He monitors his blood pressure at home, typically around 135/85 mmHg, but notes recent higher readings due to inactivity and illness.  He reports a busy work schedule, working seven days a week, including overtime, which he attributes to financial goals and recent home improvements. He acknowledges the need to slow down due to fatigue. He recently took on a new position at work and travels for work, including recent trips to [employer]  and the mountains.  He describes a recent trip to the mountains where he experienced ear popping and fluid retention in his ear, similar to a coworker's experience.   Reviewed their medical, surgical, family, social, medication, and allergy history and updated chart as appropriate.  Allergies  Allergen Reactions   Crestor  [Rosuvastatin ]     Mental fog,  myalgias   Lipitor [Atorvastatin ]     Shakes, didn't feel good on it    Past Medical History:  Diagnosis Date   Acute cholecystitis    Anxiety    Chest pain 2006   hospital eval   Chronic bilateral low back pain with sciatica, sciatica laterality unspecified    Chronic pain syndrome    COVID-19    Dyslipidemia    E coli bacteremia    Enlarged prostate    Erectile dysfunction, unspecified erectile dysfunction type    Foot drop, left    GERD (gastroesophageal reflux disease)    Heart murmur    Hepatic steatosis    History of cardiovascular stress test 2006   [facility]   Hypertension    Insomnia, unspecified type    Pancreatitis    Radicular pain of left lower extremity    Spinal stenosis, lumbar region with neurogenic claudication     Current Outpatient Medications on File Prior to Visit  Medication Sig Dispense Refill   benzonatate  (TESSALON ) 200 MG capsule Take 1 capsule (200 mg total) by mouth 2 (two) times daily as needed for cough. 20 capsule 0   Multiple Vitamin (MULTIVITAMIN) capsule Take 1 capsule by mouth daily.     Omega-3 Fatty Acids (FISH OIL  PO) Take 1 tablet by mouth daily at 6 (six) AM.     No current facility-administered medications on file prior to visit.      Current Outpatient Medications:    amoxicillin  (AMOXIL ) 875 MG tablet, Take 1 tablet (875 mg total) by mouth 2 (two) times daily for  10 days., Disp: 20 tablet, Rfl: 0   benzonatate  (TESSALON ) 200 MG capsule, Take 1 capsule (200 mg total) by mouth 2 (two) times daily as needed for cough., Disp: 20 capsule, Rfl: 0   Fish Oil -Cholecalciferol (FISH OIL  + D3) 1200-1000 MG-UNIT CAPS, Take 1 capsule by mouth in the morning and at bedtime., Disp: 180 capsule, Rfl: 2   Multiple Vitamin (MULTIVITAMIN) capsule, Take 1 capsule by mouth daily., Disp: , Rfl:    Omega-3 Fatty Acids (FISH OIL  PO), Take 1 tablet by mouth daily at 6 (six) AM., Disp: , Rfl:    sildenafil  (VIAGRA ) 100 MG tablet, Take 1 tablet  (100 mg total) by mouth as needed for erectile dysfunction., Disp: 30 tablet, Rfl: 2   valsartan -hydrochlorothiazide  (DIOVAN -HCT) 80-12.5 MG tablet, Take 1 tablet by mouth daily., Disp: 90 tablet, Rfl: 2  Family History  Problem Relation Age of Onset   Hypertension Mother    Hypothyroidism Mother    Diabetes Mother    Hypertension Sister    Other Father        history unknown   Hypertension Brother    Diabetes Brother    Cancer Maternal Aunt        breast   Stroke Maternal Uncle    Hypertension Brother    Hypertension Brother    Heart disease Neg Hx     Past Surgical History:  Procedure Laterality Date   COLONOSCOPY     COLONOSCOPY  06/2020   repeat 2032, Dr. Belvie Just   TESTICLE TORSION REDUCTION     teenage years     Review of Systems  Constitutional:  Negative for chills, fever, malaise/fatigue and weight loss.  HENT:  Positive for congestion and sore throat. Negative for ear pain, hearing loss and tinnitus.   Eyes:  Negative for blurred vision, pain and redness.  Respiratory:  Positive for cough. Negative for hemoptysis and shortness of breath.   Cardiovascular:  Negative for chest pain, palpitations, orthopnea, claudication and leg swelling.  Gastrointestinal:  Negative for abdominal pain, blood in stool, constipation, diarrhea, nausea and vomiting.  Genitourinary:  Negative for dysuria, flank pain, frequency, hematuria and urgency.  Musculoskeletal:  Positive for myalgias. Negative for falls and joint pain.  Skin:  Negative for itching and rash.  Neurological:  Negative for dizziness, tingling, speech change, weakness and headaches.  Endo/Heme/Allergies:  Negative for polydipsia. Does not bruise/bleed easily.  Psychiatric/Behavioral:  Negative for depression and memory loss. The patient is not nervous/anxious and does not have insomnia.         Objective:  BP 124/80   Pulse 68   Ht 5' 11 (1.803 m)   Wt 204 lb 6.4 oz (92.7 kg)   BMI 28.51 kg/m    General appearance: alert, no distress, WD/WN, African American male Skin: unremarkable HEENT: normocephalic, conjunctiva/corneas normal, sclerae anicteric, PERRLA, EOMi, nares with mild discharge, +erythema, pharynx with moderate erythema Oral cavity: MMM, tongue normal, teeth normal Neck: supple, no lymphadenopathy, no thyromegaly, no masses, normal ROM, no bruits Chest: non tender, normal shape and expansion Heart: RRR, normal S1, S2, no murmurs Lungs: CTA bilaterally, no wheezes, rhonchi, or rales Abdomen: +bs, soft, non tender, non distended, no masses, no hepatomegaly, no splenomegaly, no bruits Back: non tender, normal ROM, no scoliosis Musculoskeletal: upper extremities non tender, no obvious deformity, normal ROM throughout, lower extremities non tender, no obvious deformity, normal ROM throughout Extremities: no edema, no cyanosis, no clubbing Pulses: 2+ symmetric, upper and lower extremities, normal cap refill  Neurological: alert, oriented x 3, CN2-12 intact, strength normal upper extremities and lower extremities, sensation normal throughout, DTRs 2+ throughout, no cerebellar signs, gait normal Psychiatric: normal affect, behavior normal, pleasant  GU: normal male external genitalia, nontender, no masses, no hernia, no lymphadenopathy Rectal: deferred    Assessment and Plan :   Encounter Diagnoses  Name Primary?   Encounter for health maintenance examination in adult Yes   Primary hypertension    Enlarged prostate    Dyslipidemia    Spinal stenosis, lumbar region, with neurogenic claudication    Cough, unspecified type    Screening for prostate cancer    Vaccine counseling    Screening for hematuria or proteinuria    Impaired fasting blood sugar    Leg cramp    Statin-induced myositis     This visit was a preventative care visit, also known as wellness visit or routine physical.   Topics typically include healthy lifestyle, diet, exercise, preventative care,  vaccinations, sick and well care, proper use of emergency dept and after hours care, as well as other concerns.     Separate significant issues discussed: Hypertension-continue valsartan  HCT 80/12.5 mg daily  History of COVID recently but has ongoing congestion and moderate erythema of the throat and findings today.  Begin amoxicillin  course along with some other counter Mucinex.  Erectile dysfunction-continue Viagra  as needed  Statin myositis-pending labs consider other medication along with healthy diet  Leg cramping-updated labs ordered.  Hydrate well.  Stretch regularly    General Recommendations: Continue to return yearly for your annual wellness and preventative care visits.  This gives us  a chance to discuss healthy lifestyle, exercise, vaccinations, review your chart record, and perform screenings where appropriate.  I recommend you see your eye doctor yearly for routine vision care.  I recommend you see your dentist yearly for routine dental care including hygiene visits twice yearly.   Vaccination  Immunization History  Administered Date(s) Administered   Td 12/07/2006   Tdap 11/29/2009, 01/08/2017    Vaccine recommendations: Flu, tdap, shingrix, pneumococcal  He declines vaccines   Screening for cancer: Colon cancer screening: 3/22 reviewed, repeat 2032 hung   Prostate Cancer screening: The recommended prostate cancer screening test is a blood test called the prostate-specific antigen (PSA) test. PSA is a protein that is made in the prostate. As you age, your prostate naturally produces more PSA. Abnormally high PSA levels may be caused by: Prostate cancer. An enlarged prostate that is not caused by cancer (benign prostatic hyperplasia, or BPH). This condition is very common in older men. A prostate gland infection (prostatitis) or urinary tract infection. Certain medicines such as male hormones (like testosterone ) or other medicines that raise testosterone   levels. A rectal exam may be done as part of prostate cancer screening to help provide information about the size of your prostate gland. When a rectal exam is performed, it should be done after the PSA level is drawn to avoid any effect on the results.   Skin cancer screening: Check your skin regularly for new changes, growing lesions, or other lesions of concern Come in for evaluation if you have skin lesions of concern.   Lung cancer screening: If you have a greater than 20 pack year history of tobacco use, then you may qualify for lung cancer screening with a chest CT scan.   Please call your insurance company to inquire about coverage for this test.   Pancreatic cancer:  no current screening test is available or routinely  recommended. (risk factors: smoking, overweight or obese, diabetes, chronic pancreatitis, work exposure - dry cleaning, metal working, 58yo>, M>F, Tree surgeon, family hx/o, hereditary breast, ovarian, melanoma, lynch, peutz-jeghers).  Symptoms: jaundice, dark urine, light color or greasy stools, itchy skin, belly or back pain, weight loss, poor appetite, nause, vomiting, liver enlargement, DVT/blood clots.   We currently don't have screenings for other cancers besides breast, cervical, colon, and lung cancers.  If you have a strong family history of cancer or have other cancer screening concerns, please let me know.  Genetic testing referral is an option for individuals with high cancer risk in the family.  There are some other cancer screenings in development currently.   Bone health: Get at least 150 minutes of aerobic exercise weekly Get weight bearing exercise at least once weekly Bone density test:  A bone density test is an imaging test that uses a type of X-ray to measure the amount of calcium  and other minerals in your bones. The test may be used to diagnose or screen you for a condition that causes weak or thin bones (osteoporosis), predict your risk for a  broken bone (fracture), or determine how well your osteoporosis treatment is working. The bone density test is recommended for females 65 and older, or females or males <65 if certain risk factors such as thyroid disease, long term use of steroids such as for asthma or rheumatological issues, vitamin D deficiency, estrogen deficiency, family history of osteoporosis, self or family history of fragility fracture in first degree relative.    Heart health: Get at least 150 minutes of aerobic exercise weekly Limit alcohol It is important to maintain a healthy blood pressure and healthy cholesterol numbers  Heart disease screening: Screening for heart disease includes screening for blood pressure, fasting lipids, glucose/diabetes screening, BMI height to weight ratio, reviewed of smoking status, physical activity, and diet.    Goals include blood pressure 120/80 or less, maintaining a healthy lipid/cholesterol profile, preventing diabetes or keeping diabetes numbers under good control, not smoking or using tobacco products, exercising most days per week or at least 150 minutes per week of exercise, and eating healthy variety of fruits and vegetables, healthy oils, and avoiding unhealthy food choices like fried food, fast food, high sugar and high cholesterol foods.    Other tests may possibly include EKG test, CT coronary calcium  score, echocardiogram, exercise treadmill stress test.      Vascular disease screening: For higher risk individuals including smokers, diabetics, patients with known heart disease or high blood pressure, kidney disease, and others, screening for vascular disease or atherosclerosis of the arteries is available.  Examples may include carotid ultrasound, abdominal aortic ultrasound, ABI blood flow screening in the legs, thoracic aorta screening.   Medical care options: I recommend you continue to seek care here first for routine care.  We try really hard to have available  appointments Monday through Friday daytime hours for sick visits, acute visits, and physicals.  Urgent care should be used for after hours and weekends for significant issues that cannot wait till the next day.  The emergency department should be used for significant potentially life-threatening emergencies.  The emergency department is expensive, can often have long wait times for less significant concerns, so try to utilize primary care, urgent care, or telemedicine when possible to avoid unnecessary trips to the emergency department.  Virtual visits and telemedicine have been introduced since the pandemic started in 2020, and can be convenient ways to receive medical care.  We offer virtual appointments as well to assist you in a variety of options to seek medical care.   Legal  Take the time to do a last will and testament, Advanced Directives including Health Care Power of Attorney and Living Will documents.  Don't leave your family with burdens that can be handled ahead of time.   Advanced Directives: I recommend you consider completing a Health Care Power of Attorney and Living Will.   These documents respect your wishes and help alleviate burdens on your loved ones if you were to become terminally ill or be in a position to need those documents enforced.    You can complete Advanced Directives yourself, have them notarized, then have copies made for our office, for you and for anybody you feel should have them in safe keeping.  Or, you can have an attorney prepare these documents.   If you haven't updated your Last Will and Testament in a while, it may be worthwhile having an attorney prepare these documents together and save on some costs.       Spiritual and Emotional Health Keeping a healthy spiritual life can help you better manage your physical health. Your spiritual life can help you to cope with any issues that may arise with your physical health.  Balance can keep us  healthy and help  us  to recover.  If you are struggling with your spiritual health there are questions that you may want to ask yourself:  What makes me feel most complete? When do I feel most connected to the rest of the world? Where do I find the most inner strength? What am I doing when I feel whole?  Helpful tips: Being in nature. Some people feel very connected and at peace when they are walking outdoors or are outside. Helping others. Some feel the largest sense of wellbeing when they are of service to others. Being of service can take on many forms. It can be doing volunteer work, being kind to strangers, or offering a hand to a friend in need. Gratitude. Some people find they feel the most connected when they remain grateful. They may make lists of all the things they are grateful for or say a thank you out loud for all they have.   Omar Phillips was seen today for annual exam.  Diagnoses and all orders for this visit:  Encounter for health maintenance examination in adult -     CBC; Future -     Comprehensive metabolic panel with GFR; Future -     Lipid panel; Future -     TSH; Future -     PSA; Future -     Hemoglobin A1c; Future -     Vitamin B12; Future -     Uric acid; Future  Primary hypertension  Enlarged prostate  Dyslipidemia -     Lipid panel; Future  Spinal stenosis, lumbar region, with neurogenic claudication  Cough, unspecified type  Screening for prostate cancer -     PSA; Future  Vaccine counseling  Screening for hematuria or proteinuria -     Urinalysis, Routine w reflex microscopic; Future  Impaired fasting blood sugar -     Hemoglobin A1c; Future  Leg cramp -     CBC; Future -     Comprehensive metabolic panel with GFR; Future -     Vitamin B12; Future -     Uric acid; Future  Statin-induced myositis  Other orders -     amoxicillin  (AMOXIL )  875 MG tablet; Take 1 tablet (875 mg total) by mouth 2 (two) times daily for 10 days. -      valsartan -hydrochlorothiazide  (DIOVAN -HCT) 80-12.5 MG tablet; Take 1 tablet by mouth daily. -     sildenafil  (VIAGRA ) 100 MG tablet; Take 1 tablet (100 mg total) by mouth as needed for erectile dysfunction. -     Fish Oil -Cholecalciferol (FISH OIL  + D3) 1200-1000 MG-UNIT CAPS; Take 1 capsule by mouth in the morning and at bedtime.    Follow-up pending labs, yearly for physical

## 2023-12-28 ENCOUNTER — Other Ambulatory Visit

## 2023-12-28 DIAGNOSIS — E785 Hyperlipidemia, unspecified: Secondary | ICD-10-CM

## 2023-12-28 DIAGNOSIS — R252 Cramp and spasm: Secondary | ICD-10-CM

## 2023-12-28 DIAGNOSIS — Z1389 Encounter for screening for other disorder: Secondary | ICD-10-CM

## 2023-12-28 DIAGNOSIS — R7301 Impaired fasting glucose: Secondary | ICD-10-CM

## 2023-12-28 DIAGNOSIS — Z0000 Encounter for general adult medical examination without abnormal findings: Secondary | ICD-10-CM

## 2023-12-28 DIAGNOSIS — Z125 Encounter for screening for malignant neoplasm of prostate: Secondary | ICD-10-CM

## 2023-12-28 LAB — MICROSCOPIC EXAMINATION

## 2023-12-29 LAB — LIPID PANEL
Chol/HDL Ratio: 7 ratio — ABNORMAL HIGH (ref 0.0–5.0)
Cholesterol, Total: 278 mg/dL — ABNORMAL HIGH (ref 100–199)
HDL: 40 mg/dL (ref >39)
LDL Chol Calc (NIH): 177 mg/dL — ABNORMAL HIGH (ref 0–99)
Triglycerides: 317 mg/dL — ABNORMAL HIGH (ref 0–149)
VLDL Cholesterol Cal: 61 mg/dL — ABNORMAL HIGH (ref 5–40)

## 2023-12-29 LAB — COMPREHENSIVE METABOLIC PANEL WITH GFR
ALT: 27 IU/L (ref 0–44)
AST: 22 IU/L (ref 0–40)
Albumin: 4.3 g/dL (ref 3.8–4.9)
Alkaline Phosphatase: 80 IU/L (ref 47–123)
BUN/Creatinine Ratio: 14 (ref 9–20)
BUN: 16 mg/dL (ref 6–24)
Bilirubin Total: 0.6 mg/dL (ref 0.0–1.2)
CO2: 22 mmol/L (ref 20–29)
Calcium: 9.4 mg/dL (ref 8.7–10.2)
Chloride: 98 mmol/L (ref 96–106)
Creatinine, Ser: 1.16 mg/dL (ref 0.76–1.27)
Globulin, Total: 2.6 g/dL (ref 1.5–4.5)
Glucose: 193 mg/dL — ABNORMAL HIGH (ref 70–99)
Potassium: 4.2 mmol/L (ref 3.5–5.2)
Sodium: 136 mmol/L (ref 134–144)
Total Protein: 6.9 g/dL (ref 6.0–8.5)
eGFR: 74 mL/min/1.73 (ref >59)

## 2023-12-29 LAB — MICROSCOPIC EXAMINATION
Bacteria, UA: NONE SEEN
Casts: NONE SEEN /LPF
Epithelial Cells (non renal): NONE SEEN /HPF (ref 0–10)
WBC, UA: NONE SEEN /HPF (ref 0–5)

## 2023-12-29 LAB — HEMOGLOBIN A1C
Est. average glucose Bld gHb Est-mCnc: 194 mg/dL
Hgb A1c MFr Bld: 8.4 % — ABNORMAL HIGH (ref 4.8–5.6)

## 2023-12-29 LAB — URINALYSIS, ROUTINE W REFLEX MICROSCOPIC
Bilirubin, UA: NEGATIVE
Glucose, UA: NEGATIVE
Ketones, UA: NEGATIVE
Leukocytes,UA: NEGATIVE
Nitrite, UA: NEGATIVE
RBC, UA: NEGATIVE
Specific Gravity, UA: 1.029 (ref 1.005–1.030)
Urobilinogen, Ur: 0.2 mg/dL (ref 0.2–1.0)
pH, UA: 7 (ref 5.0–7.5)

## 2023-12-29 LAB — CBC
Hematocrit: 47.5 % (ref 37.5–51.0)
Hemoglobin: 15.2 g/dL (ref 13.0–17.7)
MCH: 30.3 pg (ref 26.6–33.0)
MCHC: 32 g/dL (ref 31.5–35.7)
MCV: 95 fL (ref 79–97)
Platelets: 316 x10E3/uL (ref 150–450)
RBC: 5.01 x10E6/uL (ref 4.14–5.80)
RDW: 12.9 % (ref 11.6–15.4)
WBC: 4.7 x10E3/uL (ref 3.4–10.8)

## 2023-12-29 LAB — PSA: Prostate Specific Ag, Serum: 1.7 ng/mL (ref 0.0–4.0)

## 2023-12-29 LAB — TSH: TSH: 0.473 u[IU]/mL (ref 0.450–4.500)

## 2023-12-29 LAB — VITAMIN B12: Vitamin B-12: 916 pg/mL (ref 232–1245)

## 2023-12-29 LAB — URIC ACID: Uric Acid: 6.3 mg/dL (ref 3.8–8.4)

## 2023-12-31 ENCOUNTER — Other Ambulatory Visit: Payer: Self-pay | Admitting: Medical

## 2023-12-31 ENCOUNTER — Ambulatory Visit: Payer: Self-pay | Admitting: Medical

## 2023-12-31 DIAGNOSIS — E1169 Type 2 diabetes mellitus with other specified complication: Secondary | ICD-10-CM

## 2023-12-31 MED ORDER — PRAVASTATIN SODIUM 20 MG PO TABS: 20.0000 mg | ORAL_TABLET | Freq: Every day | ORAL | 0 refills | Status: AC

## 2023-12-31 NOTE — Progress Notes (Signed)
 Labs unfortunately show diabetes number too high, hgba1c of 8.4%.  cholesterol numbers are not good.  Rest of labs ok.   I recommend trial of every 2 week self injection called repatha to help lower cholesterol and lower risks of heart disease.  I recommend starting combo medication like Janumet oral tablet daily to help control blood sugars.  If agreeable, lets give this a try  Be careful with diet as well.   Would you be agreeable to seeing a nutritionist?

## 2024-01-30 NOTE — Progress Notes (Deleted)
  Start: *** end: *** Patient is here today *** Patient would like to learn *** Patient lives with ***.  *** shopping and cooking.  History includes:  *** Medications include:  *** Labs noted:  ***   8.4%, no DM meds  No questionnaires on file.

## 2024-02-06 ENCOUNTER — Ambulatory Visit: Admitting: Dietician

## 2024-02-06 DIAGNOSIS — E1169 Type 2 diabetes mellitus with other specified complication: Secondary | ICD-10-CM

## 2024-03-19 ENCOUNTER — Other Ambulatory Visit: Payer: Self-pay

## 2024-03-19 ENCOUNTER — Encounter (HOSPITAL_BASED_OUTPATIENT_CLINIC_OR_DEPARTMENT_OTHER): Payer: Self-pay | Admitting: Emergency Medicine

## 2024-03-19 ENCOUNTER — Emergency Department (HOSPITAL_BASED_OUTPATIENT_CLINIC_OR_DEPARTMENT_OTHER)
Admission: EM | Admit: 2024-03-19 | Discharge: 2024-03-19 | Disposition: A | Discharge status: Home / Self Care | Attending: Emergency Medicine | Admitting: Emergency Medicine

## 2024-03-19 ENCOUNTER — Emergency Department (HOSPITAL_BASED_OUTPATIENT_CLINIC_OR_DEPARTMENT_OTHER)

## 2024-03-19 ENCOUNTER — Other Ambulatory Visit (HOSPITAL_BASED_OUTPATIENT_CLINIC_OR_DEPARTMENT_OTHER): Payer: Self-pay

## 2024-03-19 DIAGNOSIS — R6883 Chills (without fever): Secondary | ICD-10-CM | POA: Insufficient documentation

## 2024-03-19 DIAGNOSIS — Z79899 Other long term (current) drug therapy: Secondary | ICD-10-CM | POA: Insufficient documentation

## 2024-03-19 DIAGNOSIS — I10 Essential (primary) hypertension: Secondary | ICD-10-CM | POA: Insufficient documentation

## 2024-03-19 DIAGNOSIS — R110 Nausea: Secondary | ICD-10-CM | POA: Insufficient documentation

## 2024-03-19 DIAGNOSIS — R42 Dizziness and giddiness: Secondary | ICD-10-CM | POA: Insufficient documentation

## 2024-03-19 LAB — COMPREHENSIVE METABOLIC PANEL WITH GFR
ALT: 17 U/L (ref 0–44)
AST: 23 U/L (ref 15–41)
Albumin: 4.6 g/dL (ref 3.5–5.0)
Alkaline Phosphatase: 80 U/L (ref 38–126)
Anion gap: 13 (ref 5–15)
BUN: 18 mg/dL (ref 6–20)
CO2: 24 mmol/L (ref 22–32)
Calcium: 10.1 mg/dL (ref 8.9–10.3)
Chloride: 99 mmol/L (ref 98–111)
Creatinine, Ser: 1.16 mg/dL (ref 0.61–1.24)
GFR, Estimated: >60 mL/min (ref >60)
Glucose, Bld: 192 mg/dL — ABNORMAL HIGH (ref 70–99)
Potassium: 3.9 mmol/L (ref 3.5–5.1)
Sodium: 136 mmol/L (ref 135–145)
Total Bilirubin: 0.6 mg/dL (ref 0.0–1.2)
Total Protein: 7.9 g/dL (ref 6.5–8.1)

## 2024-03-19 LAB — URINALYSIS, ROUTINE W REFLEX MICROSCOPIC
Bacteria, UA: NONE SEEN
Bilirubin Urine: NEGATIVE
Glucose, UA: 500 mg/dL — AB
Hgb urine dipstick: NEGATIVE
Ketones, ur: NEGATIVE mg/dL
Leukocytes,Ua: NEGATIVE
Nitrite: NEGATIVE
Protein, ur: NEGATIVE mg/dL
Specific Gravity, Urine: 1.021 (ref 1.005–1.030)
pH: 6.5 (ref 5.0–8.0)

## 2024-03-19 LAB — CBC WITH DIFFERENTIAL/PLATELET
Abs Immature Granulocytes: 0.01 K/uL (ref 0.00–0.07)
Basophils Absolute: 0 K/uL (ref 0.0–0.1)
Basophils Relative: 1 %
Eosinophils Absolute: 0 K/uL (ref 0.0–0.5)
Eosinophils Relative: 0 %
HCT: 43.9 % (ref 39.0–52.0)
Hemoglobin: 15.1 g/dL (ref 13.0–17.0)
Immature Granulocytes: 0 %
Lymphocytes Relative: 26 %
Lymphs Abs: 1.5 K/uL (ref 0.7–4.0)
MCH: 30.1 pg (ref 26.0–34.0)
MCHC: 34.4 g/dL (ref 30.0–36.0)
MCV: 87.5 fL (ref 80.0–100.0)
Monocytes Absolute: 0.4 K/uL (ref 0.1–1.0)
Monocytes Relative: 7 %
Neutro Abs: 3.8 K/uL (ref 1.7–7.7)
Neutrophils Relative %: 66 %
Platelets: 279 K/uL (ref 150–400)
RBC: 5.02 MIL/uL (ref 4.22–5.81)
RDW: 12.2 % (ref 11.5–15.5)
WBC: 5.8 K/uL (ref 4.0–10.5)
nRBC: 0 % (ref 0.0–0.2)

## 2024-03-19 LAB — RESP PANEL BY RT-PCR (RSV, FLU A&B, COVID)  RVPGX2
Influenza A by PCR: NEGATIVE
Influenza B by PCR: NEGATIVE
Resp Syncytial Virus by PCR: NEGATIVE
SARS Coronavirus 2 by RT PCR: NEGATIVE

## 2024-03-19 LAB — TROPONIN T, HIGH SENSITIVITY
Troponin T High Sensitivity: 18 ng/L (ref 0–19)
Troponin T High Sensitivity: 21 ng/L — ABNORMAL HIGH (ref 0–19)
Troponin T High Sensitivity: 22 ng/L — ABNORMAL HIGH (ref 0–19)

## 2024-03-19 LAB — LIPASE, BLOOD: Lipase: 45 U/L (ref 11–51)

## 2024-03-19 LAB — TSH: TSH: 0.489 u[IU]/mL (ref 0.350–4.500)

## 2024-03-19 MED ORDER — MECLIZINE HCL 25 MG PO TABS
25.0000 mg | ORAL_TABLET | Freq: Once | ORAL | Status: AC
  Administered 2024-03-19: 25 mg via ORAL
  Filled 2024-03-19: qty 1

## 2024-03-19 MED ORDER — DIPHENHYDRAMINE HCL 50 MG/ML IJ SOLN
25.0000 mg | Freq: Once | INTRAMUSCULAR | Status: AC
  Administered 2024-03-19: 25 mg via INTRAVENOUS
  Filled 2024-03-19: qty 1

## 2024-03-19 MED ORDER — ONDANSETRON HCL 4 MG/2ML IJ SOLN
4.0000 mg | Freq: Once | INTRAMUSCULAR | Status: AC
  Administered 2024-03-19: 4 mg via INTRAVENOUS
  Filled 2024-03-19: qty 2

## 2024-03-19 MED ORDER — MECLIZINE HCL 25 MG PO TABS
25.0000 mg | ORAL_TABLET | Freq: Four times a day (QID) | ORAL | 0 refills | Status: AC | PRN
  Filled 2024-03-19: qty 30, 8d supply, fill #0

## 2024-03-19 MED ORDER — LACTATED RINGERS IV BOLUS
1000.0000 mL | Freq: Once | INTRAVENOUS | Status: AC
  Administered 2024-03-19: 1000 mL via INTRAVENOUS

## 2024-03-19 NOTE — ED Notes (Signed)
Pt took himself off the monitor.

## 2024-03-19 NOTE — ED Provider Notes (Signed)
 [place] EMERGENCY DEPARTMENT AT [facility] Provider Note   CSN: [id number] Arrival date & time: 03/19/24  9078     Patient presents with: Fatigue and Hypertension   Omar Phillips is a 58 y.o. male with history of hypertension, GERD, presents with concern for waking up this morning with chills, nausea, and generally feeling not well.  No vomiting. He also reports feeling somewhat dizzy.  He describes this as a room spinning sensation that comes on occasionally with movement. He does not report any double vision, slurred speech, facial droop, arm or leg numbness or weakness.  Reports some generalized abdominal discomfort, but no abdominal pain.  Reports his last bowel movement was this morning and was normal.  Denies any issues with constipation.  Reports he has been noncompliant with his blood pressure medication and has not taken this for about 3 weeks.  He took 1 dose of this blood pressure medication prior to arrival.    Hypertension       Prior to Admission medications   Medication Sig Start Date End Date Taking? Authorizing Provider  meclizine  (ANTIVERT ) 25 MG tablet Take 1 tablet (25 mg total) by mouth every 6 (six) hours as needed for dizziness. 03/19/24  Yes Veta Palma, PA-C  benzonatate  (TESSALON ) 200 MG capsule Take 1 capsule (200 mg total) by mouth 2 (two) times daily as needed for cough. 12/14/23   Blair, Diane W, FNP  Fish Oil -Cholecalciferol (FISH OIL  + D3) 1200-1000 MG-UNIT CAPS Take 1 capsule by mouth in the morning and at bedtime. 12/25/23   Tysinger, Alm RAMAN, PA-C  Multiple Vitamin (MULTIVITAMIN) capsule Take 1 capsule by mouth daily.    [provider]  Omega-3 Fatty Acids (FISH OIL  PO) Take 1 tablet by mouth daily at 6 (six) AM.    [provider]  pravastatin  (PRAVACHOL ) 20 MG tablet Take 1 tablet (20 mg total) by mouth daily. 12/31/23   Tysinger, Alm RAMAN, PA-C  sildenafil  (VIAGRA ) 100 MG tablet Take 1 tablet (100 mg total) by  mouth as needed for erectile dysfunction. 12/25/23   Tysinger, Alm RAMAN, PA-C  valsartan -hydrochlorothiazide  (DIOVAN -HCT) 80-12.5 MG tablet Take 1 tablet by mouth daily. 12/25/23   Tysinger, Alm RAMAN, PA-C    Allergies: Crestor  [rosuvastatin ] and Lipitor [atorvastatin ]    Review of Systems  Constitutional:  Negative for fever.  Gastrointestinal:  Positive for nausea.  Neurological:  Positive for dizziness.    Updated Vital Signs BP (!) 140/89   Pulse (!) 59   Temp 97.9 F (36.6 C)   Resp 16   Wt 85.3 kg   SpO2 95%   BMI 26.22 kg/m   Physical Exam Vitals and nursing note reviewed.  Constitutional:      General: He is not in acute distress.    Appearance: He is well-developed.  HENT:     Head: Normocephalic and atraumatic.     Comments: Patient's dizziness reportedly worsens with head movement    Mouth/Throat:     Pharynx: No oropharyngeal exudate or posterior oropharyngeal erythema.  Eyes:     Extraocular Movements: Extraocular movements intact.     Conjunctiva/sclera: Conjunctivae normal.     Pupils: Pupils are equal, round, and reactive to light.     Comments: No nystagmus noted  Cardiovascular:     Rate and Rhythm: Normal rate and regular rhythm.     Heart sounds: No murmur heard. Pulmonary:     Effort: Pulmonary effort is normal. No respiratory distress.  Breath sounds: Normal breath sounds.  Abdominal:     Palpations: Abdomen is soft.     Tenderness: There is no abdominal tenderness.  Musculoskeletal:        General: No swelling.     Cervical back: Normal range of motion and neck supple. No rigidity.  Skin:    General: Skin is warm and dry.     Capillary Refill: Capillary refill takes less than 2 seconds.  Neurological:     General: No focal deficit present.     Mental Status: He is alert and oriented to person, place, and time.     Comments: Mental status: Alert and oriented to self, place, and month  Speech: Answers questions appropriately  Cranial  Nerves: III, IV, VI: EOM intact, Pupils equal round and reactive, no gaze preference or deviation, no nystagmus. V: normal sensation in V1, V2, and V3 segments bilaterally VII: smiles, puffs cheeks, raises eyebrows, and closes eyes without asymmetry.  VIII: normal hearing to speech IX, X: normal palatal elevation, no uvular deviation XI: 5/5 head turn and 5/5 shoulder shrug bilaterally XII: midline tongue protrusion  Motor: 5/5 strength in right shoulder abductors/adductors, elbow flexors/extensors, wrist flexors/extensors, finger abductors/adductors.  5/5 in left hipflexors/extensors, knee flexors/extensors, ankle dorsiflexion and plantarflexion  5/5 strength in left shoulder abductors/adductors, elbow flexors/extensors, wrist flexors/extensors, finger abductors/adductors.  5/5 in left hipflexors/extensors, knee flexors/extensors, ankle dorsiflexion and plantarflexion   Sensory: Intact sensation in upper and lower extremity bilaterally    Coordination: Normal finger to nose and heel to shin, no tremor, no dysmetria  Gait: Normal   Psychiatric:        Mood and Affect: Mood normal.     (all labs ordered are listed, but only abnormal results are displayed) Labs Reviewed  COMPREHENSIVE METABOLIC PANEL WITH GFR - Abnormal; Notable for the following components:      Result Value   Glucose, Bld 192 (*)    All other components within normal limits  URINALYSIS, ROUTINE W REFLEX MICROSCOPIC - Abnormal; Notable for the following components:   Glucose, UA 500 (*)    All other components within normal limits  TROPONIN T, HIGH SENSITIVITY - Abnormal; Notable for the following components:   Troponin T High Sensitivity 21 (*)    All other components within normal limits  TROPONIN T, HIGH SENSITIVITY - Abnormal; Notable for the following components:   Troponin T High Sensitivity 22 (*)    All other components within normal limits  RESP PANEL BY RT-PCR (RSV, FLU A&B, COVID)  [id number]  CBC  WITH DIFFERENTIAL/PLATELET  LIPASE, BLOOD  TSH  TROPONIN T, HIGH SENSITIVITY    EKG: None  Radiology: CT Head Wo Contrast Result Date: 03/19/2024 EXAM: CT HEAD WITHOUT 03/19/2024 12:28:04 PM TECHNIQUE: CT of the head was performed without the administration of intravenous contrast. Automated exposure control, iterative reconstruction, and/or weight based adjustment of the mA/kV was utilized to reduce the radiation dose to as low as reasonably achievable. COMPARISON: CT head 01/30/2014 CLINICAL HISTORY: dizziness FINDINGS: BRAIN AND VENTRICLES: No acute intracranial hemorrhage. No mass effect or midline shift. No extra-axial fluid collection. No evidence of acute infarct. No hydrocephalus. ORBITS: No acute abnormality. SINUSES AND MASTOIDS: No acute abnormality. SOFT TISSUES AND SKULL: No acute skull fracture. No acute soft tissue abnormality. IMPRESSION: 1. No acute intracranial abnormality. Electronically signed by: Gilmore Molt MD 03/19/2024 12:39 PM EST RP Workstation: [id number]     Procedures   Medications Ordered in the ED  meclizine  (ANTIVERT ) tablet 25  mg (25 mg Oral Given 03/19/24 1027)  lactated ringers  bolus 1,000 mL (0 mLs Intravenous Stopped 03/19/24 1148)  ondansetron  (ZOFRAN ) injection 4 mg (4 mg Intravenous Given 03/19/24 1026)  diphenhydrAMINE  (BENADRYL ) injection 25 mg (25 mg Intravenous Given 03/19/24 1216)  meclizine  (ANTIVERT ) tablet 25 mg (25 mg Oral Given 03/19/24 1625)    Clinical Course as of 03/19/24 1630  Wed Mar 19, 2024  1033 Paramedic performed bladder scan given patient's abdominal pain, 175 mL noted.  No concern for acute urinary retention at this time. [AF]  1610 Personally saw patient ambulate to the restroom without difficulty.  Reports his dizziness has resolved with medications given today. [AF]    Clinical Course User Index [AF] Veta Palma, PA-C                                 Medical Decision Making Amount and/or Complexity of Data  Reviewed Labs: ordered. Radiology: ordered.  Risk Prescription drug management.     Differential diagnosis includes but is not limited to viral URI, COVID, flu, SBO, gastroenteritis, diverticulitis, electrolyte abnormality, dehydration, hypothyroidism, hyperthyroidism, TIA, CVA, UTI, benign paroxysmal positional vertigo, vestibular neuritis, ACS, arrhythmia  ED Course:  Upon initial evaluation, patient is well-appearing, no acute distress.  Normal vitals aside from an elevated blood pressure upon arrival at 163/90.  He has not been taking his blood pressure medications for the past 3 weeks.  He is also reporting some intermittent dizziness that he describes as a room spinning sensation that comes on with movement.  Denies any double vision.  No other neurologic deficits noted on exam such as slurred speech, facial droop, arm or leg numbness or weakness. Doubt TIA/CVA at this time. His last known well would have been at 11:30pm last night, outside of stroke window anyways. Abdomen is soft nontender, patient is reporting some nausea.  Will attempt symptom control with fluids, meclizine , and Zofran .  Will obtain labs for further evaluation.  Labs Ordered: I Ordered, and personally interpreted labs.  The pertinent results include:   CBC within normal limits CMP with glucose at 192, otherwise no electrolyte abnormalities.  No elevations in LFTs or creatinine Lipase within normal limits TSH within normal limits Urinalysis without signs of infection  Imaging Studies ordered: I ordered imaging studies including CT head I independently visualized the imaging with scope of interpretation limited to determining acute life threatening conditions related to emergency care. Imaging showed no acute abnormality I agree with the radiologist interpretation   Cardiac Monitoring: / EKG: The patient was maintained on a cardiac monitor.  I personally viewed and interpreted the cardiac monitored which showed  an underlying rhythm of: Flipped T waves consistent with prior EKGs, Normal sinus rhythm   Medications Given: Meclizine  Benadryl  LR bolus Zofran   Upon re-evaluation, patient has has resolved at meclizine  and fluids.  Reports nausea has improved the Zofran .  He is tolerating p.o. intake.  Remains without any other neurologic deficits.  I watched patient ambulate to the restroom and back, and he was able to do this without difficulty and without any further dizziness.  Suspect that patient's dizziness that is due to benign paroxysmal positional vertigo as dizziness worsened with head movement and did improve with the meclizine  and fluids given.  No other neurologic deficits, CT head unremarkable, and dizziness resolved, low concern for TIA or CVA, do not feel patient needs any further MRI brain imaging at this time.  Labs reassuring without any electrolyte abnormalities despite his symptoms.  CBC without any anemia or leukocytosis to suggest systemic infection.  TSH within normal limits.  Urinalysis without evidence of infection Doubt cardiac etiology.  Troponins x 3 are flat, patient without any chest pain or shortness of breath, and EKG with normal sinus rhythm. Patient did report some nausea earlier today that seem to accompany the dizziness.  This has resolved with medications given.  Abdomen is soft and nontender, normal LFTs, creatinine, lipase, no abdominal pain, doubt any acute intra-abdominal pathology such as SBO.  COVID, flu, RSV negative.  No cough or fevers to suggest pneumonia.  Patient stable and appropriate for discharge home   Impression: Dizziness, likely Benign paroxysmal positional vertigo  Disposition:  The patient was discharged home with instructions to take meclizine  as needed for dizziness.  Keep on hydrocodone at home.  Continue taking blood pressure medication as prescribed by PCP and follow up with PCP within the next month.  Return precautions given and patient  verbalized understanding.    This chart was dictated using voice recognition software, Dragon. Despite the best efforts of this provider to proofread and correct errors, errors may still occur which can change documentation meaning.       Final diagnoses:  Dizziness    ED Discharge Orders          Ordered    meclizine  (ANTIVERT ) 25 MG tablet  Every 6 hours PRN        03/19/24 1614               Veta Palma, PA-C 03/19/24 1631    Tonia Chew, MD 03/24/24 [phone]

## 2024-03-19 NOTE — Discharge Instructions (Addendum)
 Your dizziness today is likely due to vertigo which is due to an issue with the balance system in the inner ear.  Typically with vertigo, dizziness worsens with movement.  You have been prescribed a medication called meclizine  to help with dizziness.  You may take 1 tablet up to every 6 hours as needed for dizziness.  Your workup is reassuring today.  You have normal blood counts, electrolytes, kidney and liver function.  Normal thyroid function.  Your urine did not show any signs of infection.  You were negative for COVID, flu, RSV. No signs of an obvious stroke on your head CT.   Your blood pressure was elevated here today at 140/89. Aim to get 30 minutes of exercise daily and limit intake of processed/fast foods. Please take and record your blood pressures at home. Take them at the same time every day.  Please continue taking your blood pressure medication that was prescribed for you from your PCP.  Follow up with your PCP within the next month to discuss if you may need further treatment for your blood pressure.   Please follow up with your PCP if you continue to have mild symptoms even with use of the meclizine .   Please return to [facility] emergency room for any severe persistent dizziness, arm or leg numbness or weakness, slurred speech, facial droop, any other new or concerning symptoms

## 2024-03-19 NOTE — ED Triage Notes (Addendum)
 Pt c/o feel weak, dizziness, htn and abd pain with nausea today. Noncompliant with b/p meds. LKW 1130pm last night Pt aox4, speech clear.
